# Patient Record
Sex: Female | Born: 1950 | Race: White | Hispanic: No | Smoking: Current some day smoker
Health system: Southern US, Community
[De-identification: ages and names within clinical notes are randomized; demographics above are authoritative.]

## PROBLEM LIST (undated history)

## (undated) DIAGNOSIS — I779 Disorder of arteries and arterioles, unspecified: Secondary | ICD-10-CM

## (undated) DIAGNOSIS — E119 Type 2 diabetes mellitus without complications: Secondary | ICD-10-CM

## (undated) DIAGNOSIS — N809 Endometriosis, unspecified: Secondary | ICD-10-CM

## (undated) DIAGNOSIS — M1990 Unspecified osteoarthritis, unspecified site: Secondary | ICD-10-CM

## (undated) DIAGNOSIS — F419 Anxiety disorder, unspecified: Secondary | ICD-10-CM

## (undated) DIAGNOSIS — G4730 Sleep apnea, unspecified: Secondary | ICD-10-CM

## (undated) DIAGNOSIS — Z9289 Personal history of other medical treatment: Secondary | ICD-10-CM

## (undated) DIAGNOSIS — I2510 Atherosclerotic heart disease of native coronary artery without angina pectoris: Secondary | ICD-10-CM

## (undated) DIAGNOSIS — E785 Hyperlipidemia, unspecified: Secondary | ICD-10-CM

## (undated) DIAGNOSIS — K219 Gastro-esophageal reflux disease without esophagitis: Secondary | ICD-10-CM

## (undated) DIAGNOSIS — D649 Anemia, unspecified: Secondary | ICD-10-CM

## (undated) DIAGNOSIS — I10 Essential (primary) hypertension: Secondary | ICD-10-CM

## (undated) DIAGNOSIS — C44310 Basal cell carcinoma of skin of unspecified parts of face: Secondary | ICD-10-CM

## (undated) DIAGNOSIS — I739 Peripheral vascular disease, unspecified: Secondary | ICD-10-CM

## (undated) DIAGNOSIS — Z951 Presence of aortocoronary bypass graft: Secondary | ICD-10-CM

## (undated) HISTORY — PX: FRACTURE SURGERY: SHX138

## (undated) HISTORY — DX: Disorder of arteries and arterioles, unspecified: I77.9

## (undated) HISTORY — PX: SHOULDER SURGERY: SHX246

## (undated) HISTORY — DX: Hyperlipidemia, unspecified: E78.5

## (undated) HISTORY — DX: Type 2 diabetes mellitus without complications: E11.9

## (undated) HISTORY — PX: ABDOMINAL HYSTERECTOMY: SHX81

## (undated) HISTORY — PX: APPENDECTOMY: SHX54

## (undated) HISTORY — DX: Peripheral vascular disease, unspecified: I73.9

## (undated) HISTORY — DX: Atherosclerotic heart disease of native coronary artery without angina pectoris: I25.10

## (undated) HISTORY — DX: Basal cell carcinoma of skin of unspecified parts of face: C44.310

## (undated) HISTORY — DX: Endometriosis, unspecified: N80.9

## (undated) HISTORY — PX: TUBAL LIGATION: SHX77

## (undated) HISTORY — DX: Anxiety disorder, unspecified: F41.9

## (undated) HISTORY — PX: CAROTID ENDARTERECTOMY: SUR193

## (undated) HISTORY — PX: CARDIAC CATHETERIZATION: SHX172

## (undated) HISTORY — DX: Essential (primary) hypertension: I10

## (undated) HISTORY — PX: BREAST SURGERY: SHX581

## (undated) HISTORY — DX: Unspecified osteoarthritis, unspecified site: M19.90

## (undated) HISTORY — DX: Personal history of other medical treatment: Z92.89

---

## 2005-09-12 ENCOUNTER — Encounter (INDEPENDENT_AMBULATORY_CARE_PROVIDER_SITE_OTHER): Payer: Self-pay | Admitting: *Deleted

## 2005-09-12 ENCOUNTER — Inpatient Hospital Stay (HOSPITAL_COMMUNITY): Admission: RE | Admit: 2005-09-12 | Discharge: 2005-09-13 | Payer: Self-pay | Admitting: Vascular Surgery

## 2009-02-17 DIAGNOSIS — E119 Type 2 diabetes mellitus without complications: Secondary | ICD-10-CM

## 2009-02-17 DIAGNOSIS — N390 Urinary tract infection, site not specified: Secondary | ICD-10-CM | POA: Insufficient documentation

## 2009-02-17 DIAGNOSIS — E118 Type 2 diabetes mellitus with unspecified complications: Secondary | ICD-10-CM | POA: Insufficient documentation

## 2009-02-17 DIAGNOSIS — I10 Essential (primary) hypertension: Secondary | ICD-10-CM | POA: Insufficient documentation

## 2009-02-17 DIAGNOSIS — E785 Hyperlipidemia, unspecified: Secondary | ICD-10-CM | POA: Insufficient documentation

## 2009-02-17 DIAGNOSIS — E781 Pure hyperglyceridemia: Secondary | ICD-10-CM | POA: Insufficient documentation

## 2009-02-17 DIAGNOSIS — I2510 Atherosclerotic heart disease of native coronary artery without angina pectoris: Secondary | ICD-10-CM | POA: Insufficient documentation

## 2009-02-17 HISTORY — DX: Type 2 diabetes mellitus without complications: E11.9

## 2011-01-11 NOTE — Op Note (Signed)
NAME:  Darlene Berry, Darlene Berry              ACCOUNT NO.:  [id number]   MEDICAL RECORD NO.:  [id number]          PATIENT TYPE:  INP   LOCATION:  2899                         FACILITY:  [facility]   PHYSICIAN:  Larina Earthly, M.D.    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   POSTOP DIAGNOSIS:  Severe asymptomatic left internal carotid artery  stenosis.   PROCEDURES:  Left carotid endarterectomy Dacron patch angioplasty.   SURGEON:  Larina Earthly, M.D.   ASSISTANT:  Pecola Leisure, [facility]   ANESTHESIA:  General endotracheal.   COMPLICATIONS:  None.   DISPOSITION:  To recovery room stable.   PROCEDURE IN DETAIL:  The patient was taken to the operating room and placed  in supine position where the area of the left neck was prepped and draped in  the usual sterile fashion.  Incision made over the carotid and incision  carried down through the platysma with electrocautery. Sternocleidomastoid  reflected posteriorly and the carotid sheath was opened. Common carotid  artery was encircled with umbilical tape and Rumel tourniquet. Dissection  was extended onto the bifurcation. The superior thyroid artery was encircled  2-0 silk Potts tie. The external carotid encircled with vessel loop. The  internal carotid encircled with umbilical tape and Rumel tourniquet. The  vagus and hypoglossal nerves were identified and preserved. The patient was  given 7000 units of intravenous heparin. After adequate circulation time the  internal, external and common carotid arteries were occluded. The common  carotid artery was opened with 11 blade and extended longitudinally with  Potts scissors. This was extended onto the internal carotid artery. The 10  shunt was passed up the internal carotid, allowed to back bleed and then  down the common  carotid where it was secured with Rumel tourniquets. The  endarterectomy was begun on the common carotid artery. The plaques divided  proximally with Potts scissors. The endarterectomy extended onto the  bifurcation. The external carotid was endarterectomized with eversion  technique and the internal carotid endarterectomized in open fashion.  Remaining atheromatous debris was removed from the endarterectomy plane. A  Finesse Hemashield Dacron patch was brought onto the field and sewn as a  patch angioplasty with a running 6-0 Prolene suture. Prior to completion of  the anastomosis the shunt was removed and the usual flushing maneuvers were  undertaken. Anastomosis was completed. The external followed by the common,  finally the internal carotid artery occlusion clamp was removed. Excellent  flow characteristics were noted with handheld Doppler in the internal,  external carotid arteries. The patient  was given 50 milligrams of protamine reverse heparin. The wounds were  irrigated with saline. Hemostasis was obtained with electrocautery.  The  wounds were closed 3-0 Vicryl in the subcutaneous and subcuticular tissue  and benzoin and Steri-Strips were applied.      Larina Earthly, M.D.  Electronically Signed     TFE/MEDQ  D:  09/12/2005  T:  09/12/2005  Job:  [id number]

## 2011-01-11 NOTE — H&P (Signed)
NAME:  Darlene Berry, Darlene Berry              ACCOUNT NO.:  [id number]   MEDICAL RECORD NO.:  [id number]          PATIENT TYPE:  INP   LOCATION:  NA                           FACILITY:  [facility]   PHYSICIAN:  Larina Earthly, M.D.    DATE OF BIRTH:  1951-04-09   DATE OF ADMISSION:  DATE OF DISCHARGE:                                HISTORY & PHYSICAL   REASON FOR ADMISSION:  Left internal carotid artery stenosis.   HISTORY OF PRESENT ILLNESS:  The patient is a 60 year old white female who  was found on recent physical exam and to have a left carotid bruit.  She  underwent an outpatient duplex scan which showed severe left internal  carotid artery stenosis with no significant stenosis on the right.  She was  referred to Dr. Tawanna Cooler Early for further evaluation and had a repeat  ultrasound in the [facility] office.  This confirmed an 80-99% stenosis of left  internal carotid artery with a 1-39% stenosis on the right.  The patient has  been completely asymptomatic and specifically denies any TIA symptoms,  syncope, visual changes, amaurosis fugax, dysphagia, dysarthria, weakness,  or other neuro symptoms. It was Dr. Bosie Helper opinion that she should undergo  a left carotid endarterectomy at this time in order to decrease her risk of  stroke.   PAST MEDICAL HISTORY:  1.  Hypertension.  2.  Mild coronary artery disease.  3.  Type 2 insulin-dependent diabetes mellitus.  4.  Hyperlipidemia.  5.  Peripheral neuropathy.   PAST SURGICAL HISTORY:  1.  Tubal ligation in 1980.  2.  Hysterectomy in 1992.   ALLERGIES:  No known drug allergies.   MEDICATIONS:  1.  Glipizide 10 mg b.i.d.  2.  Metformin 500 mg t.i.d.  3.  Enalapril 20 mg b.i.d.  4.  Atenolol 50 mg b.i.d.  5.  Insulin 70/30 sliding scale as directed.  6.  Lantus insulin 10-25 units every day via sliding scale.  7.  Regular insulin sliding scale p.r.n.  8.  Zocor 40 mg q.h.s.  9.  Bupropion 150 mg b.i.d.  10. Alprazolam 0.5 mg every day.  11.  Loratadine 10 mg every day.  12. Prilosec OTC 20 mg every day.  13. Ibuprofen 800 mg t.i.d.  14. Aspirin 81 mg every day.  15. She takes multiple supplements including a multivitamin, calcium 600 mg      every day, vitamin E 400 mg every day, Omega-3 fish oil 1000 mg every      day, potassium 99 mg every day, cinnamon 500 mg every day, alpha lipoic      acid every day, milk thistle 100 mg every day, and vitamin C every day.   SOCIAL HISTORY:  She resides in [facility] and is employed as a Barrister's clerk at [employer].  She previously smoked a pack of  cigarettes per day x12 years and quit in 1999.  She does not consume  alcohol.   FAMILY HISTORY:  Her mother died of lung cancer and had a history of  hardening arteries.  Her father died of  coronary artery disease and a  myocardial infarction.  She has one brother who is diabetic and  hypertensive.  He recently had a carotid endarterectomy.   REVIEW OF SYSTEMS:  See history of present illness for pertinent positives  and negatives.  Also, she has a history of mild coronary artery disease and  underwent a cardiac catheterization back in the spring of 2006 with no  change since her previous cath.  She is asymptomatic from this.  She is also  diabetic and her sugars are very labile and she becomes easily hypoglycemic  becoming faint, weak, short of breath, and tired.  She denies fevers,  chills, weight loss, recent infections.  Neuro symptoms as above.  Chest  pain, palpitations, shortness of breath, dyspnea on exertion, PND,  orthopnea, cough, hemoptysis, wheezing, abdominal pain, nausea, vomiting,  diarrhea, constipation, reflux symptoms, hematochezia, melena, hematemesis,  hematuria, nocturia or dysuria, lower extremity edema, claudication, rest  pain, non-healing ulcers, depression, anxiety, intolerance to heat or cold.   PHYSICAL EXAMINATION:  VITAL SIGNS:  Blood pressure is 132/66, heart rate 70  and regular,  respirations 16 and unlabored.  GENERAL:  This is a well-developed, well-nourished white female in no acute  distress.  HEENT:  Normocephalic, atraumatic. Pupils equal, round, and react to light  accommodation.  Extraocular movements intact.  TMs and canals are clear.  Nares patent bilaterally.  Oropharynx is clear with moist mucous membranes.  Neck:  Supple without lymphadenopathy or thyromegaly.  She has a harsh left  carotid bruit.  LUNGS:  Clear to auscultation.  HEART:  Regular rate and rhythm without murmurs, rubs, or gallops.  ABDOMEN:  Soft, nontender, nondistended with active bowel sounds in all  quadrants.  No masses or hepatosplenomegaly.  EXTREMITIES:  No clubbing, cyanosis, or edema.  She has 2+ femoral, dorsalis  pedis, and posterior tibial pulses bilaterally.  NEURO:  Cranial nerves II-XII grossly intact.  She is alert and oriented x3.  Gait within normal limits.  Muscle strength is 5+ and symmetrical in the  upper and lower extremities.   ASSESSMENT/PLAN:  This is a 60 year old female with asymptomatic left  internal carotid artery stenosis.  She will be admitted to [facility], on September 12, 2005, and undergo a left carotid endarterectomy by Dr. Gretta Began.      Coral Ceo, P.A.      Larina Earthly, M.D.  Electronically Signed    GC/MEDQ  D:  09/11/2005  T:  09/11/2005  Job:  [id number]   cc:   Prudence Davidson, Dr.  Brooke Dare

## 2011-01-11 NOTE — Discharge Summary (Signed)
NAME:  Darlene Berry, Darlene Berry              ACCOUNT NO.:  [id number]   MEDICAL RECORD NO.:  [id number]          PATIENT TYPE:  INP   LOCATION:  3315                         FACILITY:  [facility]   PHYSICIAN:  Larina Earthly, M.D.    DATE OF BIRTH:  10-06-1950   DATE OF ADMISSION:  09/12/2005  DATE OF DISCHARGE:  09/13/2005                                 DISCHARGE SUMMARY   ADMISSION DIAGNOSIS:  Severe asymptomatic left internal carotid artery  stenosis.   DISCHARGE/SECONDARY DIAGNOSES:  1.  Severe asymptomatic left internal carotid artery stenosis, status post      left carotid endarterectomy.  2.  Hypertension.  3.  Mild coronary artery disease.  4.  Type 2 insulin-dependent diabetes mellitus.  5.  Hyperlipidemia.  6.  Peripheral neuropathy.  7.  Tubal ligation in 1980.  8.  Hysterectomy in 1992.   ALLERGIES:  NO KNOWN DRUG ALLERGIES.   PROCEDURES:  September 12, 2005 -- left carotid endarterectomy with Dacron  patch angioplasty by Dr. Gretta Began.   BRIEF HISTORY:  Darlene Berry is a 60 year old Caucasian female who was found  on recent physical exam to have a left carotid bruit.  She underwent an  outpatient duplex scan which showed severe left internal carotid artery  stenosis and no significant stenosis on the right.  She was referred to Dr.  Tawanna Cooler Early for further evaluation and had a repeat ultrasound at the CVTS  office which confirmed 80-99% stenosis on the left and a 1-39% stenosis on  the right.  The patient has been completely asymptomatic and denied TIA  symptoms, syncope, visual changes, amaurosis fugax, dysphagia, dysarthria,  weakness or other neurological symptoms.  Dr. Arbie Cookey recommended that she  undergo an elective left carotid endarterectomy to reduce her risk for  future stroke.   HOSPITAL COURSE:  On September 12, 2005 Darlene Berry was electively admitted to  [facility] and did undergo left carotid endarterectomy.  She was  extubated neurologically intact and  after a short stay in the recovery unit,  was transferred to the Step Down Unit at 3300 with routine carotid  endarterectomy postoperative orders.  By postoperative day one she remained  hemodynamically stable and neurologically intact.  Exam showed her heart had  a regular rate and rhythm, lungs were clear, abdominal exam was benign, and  her incision was clean, dry and intact without evidence of hematoma.  Her  tongue was midline and she reported no significant difficulty swallowing.  Also that morning she reported that she was able to ambulate without  difficulty and void following removal of her Foley catheter.  She had some  mild nausea overnight which had now resolved and she was eventually able to  tolerate a regular diet.  Blood sugar in a.m. was 150.  Other labs showed a  white blood cell count of 6.5, hemoglobin 11.0, hematocrit 32.0, platelet  count 166, sodium 138, potassium 4.3, BUN 8, creatinine 0.9.  Pain was  controlled on oral medication.  Subsequently she was felt appropriate for  discharge home on postoperative day one, September 13, 2005.  DISCHARGE MEDICATIONS:  1.  Glipizide 10 mg p.o. b.i.d.  2.  Metformin 500 mg t.i.d.  3.  Enalapril 20 mg b.i.d.  4.  Atenolol 50 mg b.i.d.  5.  Insulin 70/30 sliding-scale as directed.  6.  Lantus insulin 10-25 units daily via sliding-scale.  7.  Regular insulin sliding-scale as needed.  8.  Zocor 40 mg q.h.s.  9.  Bupropion 150 mg p.o. b.i.d.  10. Alprazolam 0.5 mg every day.  11. Loratadine 10 mg every day.  12. Prilosec OTC 20 mg every day.  13. Ibuprofen 800 mg p.o. t.i.d.  14. Aspirin 81 mg p.o. daily.  15. She may resume her multiple supplements including multivitamin, calcium      600 mg, vitamin E 400 IUOmega3 fish oil 1000 mg capsules, potassium 99      mg, cinnamon 500 mg, alpha lipoic acid, milk thistle 100 mg and daily      vitamin C.  16. Tylox 1-2 tablets p.o. q.4h. p.r.n. pain.   DISCHARGE INSTRUCTIONS:  She  is to resume a diabetic appropriate diet.  She  is to avoid driving or heavy lifting for the next weeks.  She is to increase  her activities slowly and may begin showering on September 14, 2005.  She is  to notify the CVTS office if she develops fever greater than 101 or redness  or drainage from her incision site, or any changes in her neurological  status.  She is to follow-up with Dr. Tawanna Cooler Early at the CVTS office on  September 30, 2005 at 12:10 p.m. and should call sooner if needed.      Jerold Coombe, P.A.      Larina Earthly, M.D.  Electronically Signed    AWZ/MEDQ  D:  09/13/2005  T:  09/13/2005  Job:  [id number]   cc:   Prudence Davidson, MD

## 2012-04-06 DIAGNOSIS — M25569 Pain in unspecified knee: Secondary | ICD-10-CM | POA: Insufficient documentation

## 2012-04-06 DIAGNOSIS — E1140 Type 2 diabetes mellitus with diabetic neuropathy, unspecified: Secondary | ICD-10-CM

## 2012-04-06 HISTORY — DX: Type 2 diabetes mellitus with diabetic neuropathy, unspecified: E11.40

## 2012-08-18 DIAGNOSIS — N993 Prolapse of vaginal vault after hysterectomy: Secondary | ICD-10-CM | POA: Insufficient documentation

## 2012-08-18 HISTORY — DX: Prolapse of vaginal vault after hysterectomy: N99.3

## 2012-12-29 DIAGNOSIS — Z85828 Personal history of other malignant neoplasm of skin: Secondary | ICD-10-CM

## 2012-12-29 HISTORY — DX: Personal history of other malignant neoplasm of skin: Z85.828

## 2013-02-08 DIAGNOSIS — I209 Angina pectoris, unspecified: Secondary | ICD-10-CM | POA: Insufficient documentation

## 2013-02-24 ENCOUNTER — Other Ambulatory Visit: Payer: Self-pay | Admitting: *Deleted

## 2013-02-24 ENCOUNTER — Institutional Professional Consult (permissible substitution) (INDEPENDENT_AMBULATORY_CARE_PROVIDER_SITE_OTHER): Payer: Self-pay | Admitting: Thoracic Surgery (Cardiothoracic Vascular Surgery)

## 2013-02-24 VITALS — BP 123/77 | HR 82 | Resp 20 | Ht 62.0 in | Wt 157.0 lb

## 2013-02-24 DIAGNOSIS — I2510 Atherosclerotic heart disease of native coronary artery without angina pectoris: Secondary | ICD-10-CM | POA: Insufficient documentation

## 2013-02-24 NOTE — Progress Notes (Signed)
[address]       [name] [address]             [phone]     CARDIOTHORACIC SURGERY CONSULTATION REPORT  Referring Provider is Dara Hoyer, MD PCP is Verneita Griffes, MD  Chief Complaint  Patient presents with  . 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  The patient has undergone cardiac catheterization twice previously in the distant past for occasional symptoms of chest pain. Over the past several months the patient has developed increasing episodes of substernal chest tightness which typically occurs with physical activity and relieved by rest. 3 weeks ago she was working in her garden and doing some fairly strenuous digging when she developed a particular severe episode of substernal chest tightness which radiated to the jaw and was associated with severe shortness of breath. She stopped and rested and laid down, and the symptoms gradually went away. She was seen by her primary care physician and referred to [facility] in [place] where she underwent diagnostic cardiac catheterization. She was found to have three-vessel coronary artery disease with normal left ventricular systolic function. It was recommended that she undergo surgical revascularization and she was seen in consultation by one of the cardiothoracic surgeons. However, the patient insisted on referral to [facility] for definitive management. Because the patient's symptoms remained stable and her coronary anatomy was not felt to be critical, she was discharged from the hospital and referred for elective cardiothoracic surgical  consultation.  The patient states that she has been having mild symptoms of tightness across her chest and shortness of breath with exertion off and on for several months. She had the one particular a severe episode of chest discomfort as described above when she was performing strenuous activity several weeks ago. Since her hospital discharge recently she had 1 brief episode of chest discomfort which resolved promptly with administration of sublingual nitroglycerin. She has stable symptoms of exertional shortness of breath. She denies resting shortness of breath, PND, orthopnea, or lower extremity edema. She has not had palpitations nor syncope.  Past Medical History  Diagnosis Date  . Endometriosis   . Diabetes mellitus   . Carotid artery stenosis   . CAD (coronary artery disease)   . Hyperlipemia   . Hypertension   . Hx of transfusion   . Anxiety   . Arthritis     Past Surgical History  Procedure Laterality Date  . Abdominal hysterectomy    . Tubal ligation    . Cardiac catheterization      x 3  . Carotid endarterectomy Left   . Shoulder surgery Left     Family History  Problem Relation Age of Onset  . Cancer Mother   . Diabetes Father   . Hypertension Father   . Hyperlipidemia Father   . Heart disease Father   . Leukemia Sister   . Diabetes Brother   . Hyperlipidemia Brother   . Hypertension Brother   . Heart disease Brother     History   Social History  . Marital Status: Legally Separated    Spouse Name: N/A    Number of  Children: N/A  . Years of Education: N/A   Occupational History  . Not on file.   Social History Main Topics  . Smoking status: Current Every Day Smoker -- 0.50 packs/day    Types: Cigarettes  . Smokeless tobacco: Never Used  . Alcohol Use: No  . Drug Use: No  . Sexually Active: Not on file   Other Topics Concern  . Not on file   Social History Narrative  . No narrative on file    Current Outpatient Prescriptions  Medication  Sig Dispense Refill  . ALPRAZolam (XANAX) 0.5 MG tablet Take 0.5 mg by mouth at bedtime as needed for sleep.      Marland Kitchen aspirin 325 MG tablet Take 325 mg by mouth daily.      Marland Kitchen atenolol (TENORMIN) 25 MG tablet Take 25 mg by mouth 2 (two) times daily.       Marland Kitchen buPROPion (WELLBUTRIN SR) 150 MG 12 hr tablet Take 150 mg by mouth 2 (two) times daily.      . fenofibrate 160 MG tablet Take 160 mg by mouth daily.      Marland Kitchen glipiZIDE (GLUCOTROL) 10 MG tablet Take 10 mg by mouth 2 (two) times daily before a meal.      . insulin glargine (LANTUS) 100 UNIT/ML injection Inject 70 Units into the skin at bedtime.      . metformin (FORTAMET) 1000 MG (OSM) 24 hr tablet Take 1,000 mg by mouth 2 (two) times daily.      . nitrofurantoin (MACRODANTIN) 50 MG capsule Take 50 mg by mouth at bedtime.      . nitroGLYCERIN (NITROSTAT) 0.4 MG SL tablet Place 0.4 mg under the tongue every 5 (five) minutes as needed for chest pain.      . pravastatin (PRAVACHOL) 40 MG tablet Take 40 mg by mouth daily.      Marland Kitchen PRENATAL VITAMINS PO Take by mouth daily.      Marland Kitchen zolpidem (AMBIEN) 5 MG tablet Take 5 mg by mouth at bedtime as needed for sleep.       No current facility-administered medications for this visit.    No Known Allergies    Review of Systems:   General:  normal appetite, decreased energy, + weight gain, no weight loss, no fever  Cardiac:  + chest pain with exertion, no chest pain at rest, + SOB with moderate exertion, no resting SOB, no PND, no orthopnea, no palpitations, no arrhythmia, no atrial fibrillation, no LE edema, no dizzy spells, no syncope  Respiratory:  + shortness of breath, no home oxygen, no productive cough, + dry cough, no bronchitis, no wheezing, no hemoptysis, no asthma, no pain with inspiration or cough, + sleep apnea, + CPAP at night  GI:   + difficulty swallowing, + reflux, + frequent heartburn, no hiatal hernia, no abdominal pain, no constipation, no diarrhea, no hematochezia, no hematemesis, no  melena  GU:   no dysuria,  no frequency, recurrent urinary tract infections in past but none on suppression therapy, no hematuria, no kidney stones, no kidney disease  Vascular:  + mild pain in both calf muscles with ambulation, possibly suggestive of claudication, + pain in feet, no leg cramps, no varicose veins, no DVT, no non-healing foot ulcer  Neuro:   no stroke, no TIA's, no seizures, no headaches, no temporary blindness one eye,  no slurred speech, + peripheral neuropathy, + chronic pain in left shoulder, no instability of gait, no memory/cognitive dysfunction  Musculoskeletal: + arthritis  especially left shoulder, no joint swelling, + myalgias, no difficulty walking, normal mobility   Skin:   no rash, no itching, no skin infections, no pressure sores or ulcerations  Psych:   + anxiety, no depression, + nervousness, + unusual recent stress  Eyes:   no blurry vision, no floaters, no recent vision changes, + wears glasses or contacts  ENT:   no hearing loss, no loose or painful teeth, no dentures, last saw dentist June 2014  Hematologic:  no easy bruising, no abnormal bleeding, no clotting disorder, no frequent epistaxis  Endocrine:  + diabetes, checks CBG's at home, last Hgb a1c reportedly 6.7     Physical Exam:   BP 123/77  Pulse 82  Resp 20  Ht 5\' 2"  (1.575 m)  Wt 157 lb (71.215 kg)  BMI 28.71 kg/m2  SpO2 97%  General:  Mildly obese,  well-appearing  HEENT:  Unremarkable   Neck:   no JVD, no bruits, no adenopathy   Chest:   clear to auscultation, symmetrical breath sounds, no wheezes, no rhonchi   CV:   RRR, no murmur   Abdomen:  soft, non-tender, no masses   Extremities:  warm, well-perfused, pulses diminished, no LE edema  Rectal/GU  Deferred  Neuro:   Grossly non-focal and symmetrical throughout  Skin:   Clean and dry, no rashes, no breakdown   Diagnostic Tests:  CARDIAC CATHETERIZATION  Both report and images from diagnostic cardiac catheterization performed  02/18/2013 at [facility] are reviewed. The patient has three-vessel coronary artery disease with long segment 60-70% stenosis of the proximal left anterior descending coronary artery and 70% stenosis of the mid right coronary artery. The left circumflex coronary artery is a small vessel but has an 80% lesion. There is right dominant coronary circulation with 80% stenosis in the mid right coronary artery.   TRANSTHORACIC ECHOCARDIOGRAM  Both the report and images from tansthoracic echocardiogram performed 02/19/2013 [facility] is reviewed.  There is normal left ventricular size and systolic function. Ejection fraction is estimated 65-70%. There is very mild aortic valve sclerosis without aortic stenosis. No other significant abnormalities are noted.   Impression:  The patient has severe three-vessel coronary artery disease with preserved left ventricular systolic function and progressive symptoms of angina pectoris, functional class II.  The patient's right coronary artery stenosis could likely easily be treated with percutaneous coronary intervention and stenting. However, the long segment proximal stenosis of the left anterior descending coronary artery would not be favorable for stenting, and given the patient's underlying history of type 2 diabetes mellitus I feel that surgical revascularization would clearly provide the best long-term result. Risks associated with surgery should be quite low.    Plan:  I discussed the indications, risks, and potential benefits of coronary artery bypass grafting with the patient in the office today. Alternative treatment strategies have been discussed including long-term medical therapy versus percutaneous coronary intervention and stenting versus surgery. She understands and accepts all potential associated risks of surgery including but not limited to risk of death, stroke, myocardial infarction, congestive heart failure, respiratory  failure, renal failure, pneumonia, bleeding requiring blood transfusion, arrhythmia, infection, or late recurrence of coronary artery disease. We have discussed how important will be for her to find a way to quit smoking permanently. We've also discussed how important it will be for her to continue to be aggressive about the treatment of diabetes. All of her questions been addressed. We plan to proceed with surgery on Wednesday, July  16.    Clarence H. Cornelius Moras, MD 02/24/2013 5:27 PM

## 2013-02-24 NOTE — Patient Instructions (Addendum)
Continue all current medications without change through the day before surgery  On the morning of surgery take only your atenolol (tenormin) with a sip of water  Call EMS or go directly to the nearest emergency room for chest pain unrelieved by 3 sublingual nitroglycerin

## 2013-02-25 ENCOUNTER — Other Ambulatory Visit: Payer: Self-pay | Admitting: *Deleted

## 2013-02-25 DIAGNOSIS — I2510 Atherosclerotic heart disease of native coronary artery without angina pectoris: Secondary | ICD-10-CM

## 2013-03-02 ENCOUNTER — Encounter (HOSPITAL_COMMUNITY): Payer: Self-pay | Admitting: Pharmacy Technician

## 2013-03-08 ENCOUNTER — Ambulatory Visit (HOSPITAL_COMMUNITY)
Admission: RE | Admit: 2013-03-08 | Discharge: 2013-03-08 | Disposition: A | Discharge status: Home / Self Care | Payer: Medicare Other | Source: Ambulatory Visit | Attending: Thoracic Surgery (Cardiothoracic Vascular Surgery) | Admitting: Thoracic Surgery (Cardiothoracic Vascular Surgery)

## 2013-03-08 ENCOUNTER — Encounter (HOSPITAL_COMMUNITY): Payer: Self-pay

## 2013-03-08 ENCOUNTER — Inpatient Hospital Stay (HOSPITAL_COMMUNITY)
Admission: RE | Admit: 2013-03-08 | Discharge: 2013-03-08 | Disposition: A | Discharge status: Home / Self Care | Payer: Medicare Other | Source: Ambulatory Visit | Attending: Thoracic Surgery (Cardiothoracic Vascular Surgery) | Admitting: Thoracic Surgery (Cardiothoracic Vascular Surgery)

## 2013-03-08 ENCOUNTER — Encounter (HOSPITAL_COMMUNITY)
Admission: RE | Admit: 2013-03-08 | Discharge: 2013-03-08 | Disposition: A | Discharge status: Home / Self Care | Payer: Medicare Other | Source: Ambulatory Visit | Attending: Thoracic Surgery (Cardiothoracic Vascular Surgery) | Admitting: Thoracic Surgery (Cardiothoracic Vascular Surgery)

## 2013-03-08 VITALS — BP 173/82 | HR 69 | Temp 97.8°F | Resp 20 | Ht 62.0 in | Wt 157.1 lb

## 2013-03-08 DIAGNOSIS — E785 Hyperlipidemia, unspecified: Secondary | ICD-10-CM | POA: Insufficient documentation

## 2013-03-08 DIAGNOSIS — I2510 Atherosclerotic heart disease of native coronary artery without angina pectoris: Secondary | ICD-10-CM

## 2013-03-08 DIAGNOSIS — Z01811 Encounter for preprocedural respiratory examination: Secondary | ICD-10-CM | POA: Insufficient documentation

## 2013-03-08 DIAGNOSIS — Z01818 Encounter for other preprocedural examination: Secondary | ICD-10-CM | POA: Insufficient documentation

## 2013-03-08 DIAGNOSIS — I10 Essential (primary) hypertension: Secondary | ICD-10-CM | POA: Insufficient documentation

## 2013-03-08 DIAGNOSIS — Z01812 Encounter for preprocedural laboratory examination: Secondary | ICD-10-CM | POA: Insufficient documentation

## 2013-03-08 DIAGNOSIS — Z01810 Encounter for preprocedural cardiovascular examination: Secondary | ICD-10-CM | POA: Insufficient documentation

## 2013-03-08 DIAGNOSIS — R9431 Abnormal electrocardiogram [ECG] [EKG]: Secondary | ICD-10-CM | POA: Insufficient documentation

## 2013-03-08 DIAGNOSIS — E119 Type 2 diabetes mellitus without complications: Secondary | ICD-10-CM | POA: Insufficient documentation

## 2013-03-08 DIAGNOSIS — Z0183 Encounter for blood typing: Secondary | ICD-10-CM | POA: Insufficient documentation

## 2013-03-08 HISTORY — DX: Sleep apnea, unspecified: G47.30

## 2013-03-08 HISTORY — DX: Gastro-esophageal reflux disease without esophagitis: K21.9

## 2013-03-08 HISTORY — DX: Anemia, unspecified: D64.9

## 2013-03-08 LAB — COMPREHENSIVE METABOLIC PANEL
ALT: 49 U/L — ABNORMAL HIGH (ref 0–35)
AST: 43 U/L — ABNORMAL HIGH (ref 0–37)
Albumin: 4.2 g/dL (ref 3.5–5.2)
Alkaline Phosphatase: 54 U/L (ref 39–117)
BUN: 16 mg/dL (ref 6–23)
CO2: 24 mEq/L (ref 19–32)
Calcium: 10.2 mg/dL (ref 8.4–10.5)
Chloride: 96 mEq/L (ref 96–112)
Creatinine, Ser: 0.87 mg/dL (ref 0.50–1.10)
GFR calc Af Amer: 81 mL/min — ABNORMAL LOW (ref >90)
GFR calc non Af Amer: 70 mL/min — ABNORMAL LOW (ref >90)
Glucose, Bld: 122 mg/dL — ABNORMAL HIGH (ref 70–99)
Potassium: 4.1 mEq/L (ref 3.5–5.1)
Sodium: 135 mEq/L (ref 135–145)
Total Bilirubin: 0.3 mg/dL (ref 0.3–1.2)
Total Protein: 7.5 g/dL (ref 6.0–8.3)

## 2013-03-08 LAB — BLOOD GAS, ARTERIAL
Acid-Base Excess: 2.3 mmol/L — ABNORMAL HIGH (ref 0.0–2.0)
Bicarbonate: 26.2 mEq/L — ABNORMAL HIGH (ref 20.0–24.0)
Drawn by: 206361
FIO2: 0.21 %
O2 Saturation: 97.5 %
Patient temperature: 98.6
TCO2: 27.4 mmol/L (ref 0–100)
pCO2 arterial: 39.7 mmHg (ref 35.0–45.0)
pH, Arterial: 7.436 (ref 7.350–7.450)
pO2, Arterial: 88.1 mmHg (ref 80.0–100.0)

## 2013-03-08 LAB — TYPE AND SCREEN
ABO/RH(D): B POS
Antibody Screen: NEGATIVE

## 2013-03-08 LAB — CBC
HCT: 39.6 % (ref 36.0–46.0)
Hemoglobin: 14.2 g/dL (ref 12.0–15.0)
MCH: 29.7 pg (ref 26.0–34.0)
MCHC: 35.9 g/dL (ref 30.0–36.0)
MCV: 82.8 fL (ref 78.0–100.0)
Platelets: 215 10*3/uL (ref 150–400)
RBC: 4.78 MIL/uL (ref 3.87–5.11)
RDW: 12 % (ref 11.5–15.5)
WBC: 9.2 10*3/uL (ref 4.0–10.5)

## 2013-03-08 LAB — URINALYSIS, ROUTINE W REFLEX MICROSCOPIC
Bilirubin Urine: NEGATIVE
Glucose, UA: NEGATIVE mg/dL
Hgb urine dipstick: NEGATIVE
Ketones, ur: NEGATIVE mg/dL
Nitrite: NEGATIVE
Protein, ur: NEGATIVE mg/dL
Specific Gravity, Urine: 1.01 (ref 1.005–1.030)
Urobilinogen, UA: 1 mg/dL (ref 0.0–1.0)
pH: 8 (ref 5.0–8.0)

## 2013-03-08 LAB — PULMONARY FUNCTION TEST

## 2013-03-08 LAB — SURGICAL PCR SCREEN
MRSA, PCR: NEGATIVE
Staphylococcus aureus: NEGATIVE

## 2013-03-08 LAB — URINE MICROSCOPIC-ADD ON

## 2013-03-08 LAB — PROTIME-INR
INR: 0.99 (ref 0.00–1.49)
Prothrombin Time: 12.9 seconds (ref 11.6–15.2)

## 2013-03-08 LAB — APTT: aPTT: 24 seconds (ref 24–37)

## 2013-03-08 LAB — HEMOGLOBIN A1C
Hgb A1c MFr Bld: 6.6 % — ABNORMAL HIGH (ref <5.7)
Mean Plasma Glucose: 143 mg/dL — ABNORMAL HIGH (ref <117)

## 2013-03-08 NOTE — H&P (Signed)
[address]       [name] [address]             [phone]          CARDIOTHORACIC SURGERY HISTORY AND PHYSICAL EXAM  Referring Provider is Dara Hoyer, MD PCP is Verneita Griffes, MD    Chief Complaint   Patient presents with   .  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  The patient has undergone cardiac catheterization twice previously in the distant past for occasional symptoms of chest pain. Over the past several months the patient has developed increasing episodes of substernal chest tightness which typically occurs with physical activity and relieved by rest. 3 weeks ago she was working in her garden and doing some fairly strenuous digging when she developed a particular severe episode of substernal chest tightness which radiated to the jaw and was associated with severe shortness of breath. She stopped and rested and laid down, and the symptoms gradually went away. She was seen by her primary care physician and referred to [facility] in [place] where she underwent diagnostic cardiac catheterization. She was found to have three-vessel coronary artery disease with normal left ventricular systolic function. It was recommended that she undergo surgical revascularization and she was seen in consultation by one of the cardiothoracic surgeons. However, the patient insisted on referral to [facility] for definitive management. Because the patient's symptoms remained stable and her coronary anatomy was not felt to be critical, she was discharged from the hospital and referred for elective cardiothoracic surgical  consultation.  The patient states that she has been having mild symptoms of tightness across her chest and shortness of breath with exertion off and on for several months. She had the one particular a severe episode of chest discomfort as described above when she was performing strenuous activity several weeks ago. Since her hospital discharge recently she had 1 brief episode of chest discomfort which resolved promptly with administration of sublingual nitroglycerin. She has stable symptoms of exertional shortness of breath. She denies resting shortness of breath, PND, orthopnea, or lower extremity edema. She has not had palpitations nor syncope.   Past Medical History  Diagnosis Date  . Endometriosis   . Diabetes mellitus   . Carotid artery stenosis   . CAD (coronary artery disease)   . Hyperlipemia   . Hypertension   . Hx of transfusion   . Anxiety   . Arthritis     Past Surgical History  Procedure Laterality Date  . Abdominal hysterectomy    . Tubal ligation    . Cardiac catheterization      x 3  . Carotid endarterectomy Left   . Shoulder surgery Left     Family History  Problem Relation Age of Onset  . Cancer Mother   . Diabetes Father   . Hypertension Father   . Hyperlipidemia Father   . Heart disease Father   . Leukemia Sister   . Diabetes Brother   . Hyperlipidemia Brother   . Hypertension Brother   . Heart disease Brother     Social History History  Substance  Use Topics  . Smoking status: Current Every Day Smoker -- 0.50 packs/day    Types: Cigarettes  . Smokeless tobacco: Never Used  . Alcohol Use: No    Prior to Admission medications   Medication Sig Start Date End Date Taking? Authorizing Provider  ALPRAZolam Prudy Feeler) 0.5 MG tablet Take 0.5 mg by mouth at bedtime as needed for sleep.   Yes Historical Provider, MD  aspirin 325 MG tablet Take 325 mg by mouth daily.   Yes Historical Provider, MD  atenolol (TENORMIN) 25 MG tablet Take 25 mg by mouth 2  (two) times daily.    Yes Historical Provider, MD  buPROPion (WELLBUTRIN SR) 150 MG 12 hr tablet Take 150 mg by mouth 2 (two) times daily.   Yes Historical Provider, MD  fenofibrate 160 MG tablet Take 160 mg by mouth daily.   Yes Historical Provider, MD  glipiZIDE (GLUCOTROL) 10 MG tablet Take 10 mg by mouth 2 (two) times daily before a meal.   Yes Historical Provider, MD  insulin aspart (NOVOLOG) 100 UNIT/ML injection Inject 0-8 Units into the skin daily as needed for high blood sugar.   Yes Historical Provider, MD  insulin glargine (LANTUS) 100 UNIT/ML injection Inject 70 Units into the skin at bedtime.   Yes Historical Provider, MD  magnesium oxide (MAG-OX) 400 MG tablet Take 400 mg by mouth daily.   Yes Historical Provider, MD  metformin (FORTAMET) 1000 MG (OSM) 24 hr tablet Take 1,000 mg by mouth 2 (two) times daily.   Yes Historical Provider, MD  nitrofurantoin (MACRODANTIN) 50 MG capsule Take 50 mg by mouth at bedtime.   Yes Historical Provider, MD  nitroGLYCERIN (NITROSTAT) 0.4 MG SL tablet Place 0.4 mg under the tongue every 5 (five) minutes as needed for chest pain.   Yes Historical Provider, MD  Potassium Gluconate 595 MG CAPS Take 595 mg by mouth daily.   Yes Historical Provider, MD  pravastatin (PRAVACHOL) 40 MG tablet Take 40 mg by mouth daily.   Yes Historical Provider, MD  PRENATAL VITAMINS PO Take by mouth daily.   Yes Historical Provider, MD  zolpidem (AMBIEN) 5 MG tablet Take 5 mg by mouth at bedtime as needed for sleep.   Yes Historical Provider, MD    No Known Allergies     Review of Systems:              General:                      normal appetite, decreased energy, + weight gain, no weight loss, no fever             Cardiac:                      + chest pain with exertion, no chest pain at rest, + SOB with moderate exertion, no resting SOB, no PND, no orthopnea, no palpitations, no arrhythmia, no atrial fibrillation, no LE edema, no dizzy spells, no syncope              Respiratory:                + shortness of breath, no home oxygen, no productive cough, + dry cough, no bronchitis, no wheezing, no hemoptysis, no asthma, no pain with inspiration or cough, + sleep apnea, + CPAP at night             GI:                                +  difficulty swallowing, + reflux, + frequent heartburn, no hiatal hernia, no abdominal pain, no constipation, no diarrhea, no hematochezia, no hematemesis, no melena             GU:                              no dysuria,  no frequency, recurrent urinary tract infections in past but none on suppression therapy, no hematuria, no kidney stones, no kidney disease             Vascular:                     + mild pain in both calf muscles with ambulation, possibly suggestive of claudication, + pain in feet, no leg cramps, no varicose veins, no DVT, no non-healing foot ulcer             Neuro:                         no stroke, no TIA's, no seizures, no headaches, no temporary blindness one eye,  no slurred speech, + peripheral neuropathy, + chronic pain in left shoulder, no instability of gait, no memory/cognitive dysfunction             Musculoskeletal:         + arthritis especially left shoulder, no joint swelling, + myalgias, no difficulty walking, normal mobility               Skin:                            no rash, no itching, no skin infections, no pressure sores or ulcerations             Psych:                         + anxiety, no depression, + nervousness, + unusual recent stress             Eyes:                           no blurry vision, no floaters, no recent vision changes, + wears glasses or contacts             ENT:                            no hearing loss, no loose or painful teeth, no dentures, last saw dentist June 2014             Hematologic:               no easy bruising, no abnormal bleeding, no clotting disorder, no frequent epistaxis             Endocrine:                   + diabetes, checks CBG's at home, last  Hgb a1c reportedly 6.7                           Physical Exam:              BP 123/77  Pulse 82  Resp 20  Ht 5'  2" (1.575 m)  Wt 157 lb (71.215 kg)  BMI 28.71 kg/m2  SpO2 97%             General:                      Mildly obese,  well-appearing             HEENT:                       Unremarkable               Neck:                           no JVD, no bruits, no adenopathy               Chest:                         clear to auscultation, symmetrical breath sounds, no wheezes, no rhonchi               CV:                              RRR, no murmur               Abdomen:                    soft, non-tender, no masses               Extremities:                 warm, well-perfused, pulses diminished, no LE edema             Rectal/GU                   Deferred             Neuro:                         Grossly non-focal and symmetrical throughout             Skin:                            Clean and dry, no rashes, no breakdown   Diagnostic Tests:  CARDIAC CATHETERIZATION  Both report and images from diagnostic cardiac catheterization performed 02/18/2013 at [facility] are reviewed. The patient has three-vessel coronary artery disease with long segment 60-70% stenosis of the proximal left anterior descending coronary artery and 70% stenosis of the mid right coronary artery. The left circumflex coronary artery is a small vessel but has an 80% lesion. There is right dominant coronary circulation with 80% stenosis in the mid right coronary artery.   TRANSTHORACIC ECHOCARDIOGRAM  Both the report and images from tansthoracic echocardiogram performed 02/19/2013 [facility] is reviewed.  There is normal left ventricular size and systolic function. Ejection fraction is estimated 65-70%. There is very mild aortic valve sclerosis without aortic stenosis. No other significant abnormalities are noted.   Impression:  The patient has severe three-vessel coronary  artery disease with preserved left ventricular systolic function and progressive symptoms of angina pectoris, functional class II.  The patient's right coronary artery stenosis could likely  easily be treated with percutaneous coronary intervention and stenting. However, the long segment proximal stenosis of the left anterior descending coronary artery would not be favorable for stenting, and given the patient's underlying history of type 2 diabetes mellitus I feel that surgical revascularization would clearly provide the best long-term result. Risks associated with surgery should be quite low.    Plan:  I discussed the indications, risks, and potential benefits of coronary artery bypass grafting with the patient in the office today. Alternative treatment strategies have been discussed including long-term medical therapy versus percutaneous coronary intervention and stenting versus surgery. She understands and accepts all potential associated risks of surgery including but not limited to risk of death, stroke, myocardial infarction, congestive heart failure, respiratory failure, renal failure, pneumonia, bleeding requiring blood transfusion, arrhythmia, infection, or late recurrence of coronary artery disease. We have discussed how important will be for her to find a way to quit smoking permanently. We've also discussed how important it will be for her to continue to be aggressive about the treatment of diabetes. All of her questions been addressed. We plan to proceed with surgery on Wednesday, July 16.    Salvatore Decent. Cornelius Moras, MD 02/24/2013 5:27 PM

## 2013-03-08 NOTE — Progress Notes (Addendum)
Pt. Had PFT's & dopplers today, prior to PAT appt.

## 2013-03-08 NOTE — Progress Notes (Signed)
Call to Ryann in TCT, confirmed that this pt. Has had her teaching appt. , book issued to pt. While in PAT appt.

## 2013-03-08 NOTE — Progress Notes (Signed)
*  Preliminary Results*   Pre-op Cardiac Surgery  Carotid Findings:  Completed 02/09/2013, revealed no hemodynamically significant stenosis >50%.  Upper Extremity Right Left  Brachial Pressures 197-Triphasic Triphasic- unable to compress.  Radial Waveforms Triphasic Triphasic  Ulnar Waveforms Triphasic Triphasic  Palmar Arch (Allen's Test) Within normal limits. Within normal limits.     Lower  Extremity Right Left  Dorsalis Pedis 217- Triphasic 188-Severely dampened monophasic  Anterior Tibial    Posterior Tibial 192-Triphasic 237-Triphasic  Ankle/Brachial Indices 1.12 1.22    03/08/2013 2:52 PM Anuj Summons, RVT, RDCS, RDMS

## 2013-03-08 NOTE — Progress Notes (Signed)
Requesting records from [facility] for cardiac studies, ekg, stress test, cath report.

## 2013-03-08 NOTE — Pre-Procedure Instructions (Signed)
Darlene Berry  03/08/2013   Your procedure is scheduled on:  03/10/2013  Report to [facility] at 6:30 AM.  Call this number if you have problems the morning of surgery: [phone]   Remember:   Do not eat food or drink liquids after midnight. TUESDAY   Take these medicines the morning of surgery with A SIP OF WATER: atenolol, wellbutrin, omeprazole   Do not wear jewelry, make-up or nail polish.  Do not wear lotions, powders, or perfumes. You may wear deodorant.  Do not shave 48 hours prior to surgery.   Do not bring valuables to the hospital.  [facility] is not responsible                   for any belongings or valuables.  Contacts, dentures or bridgework may not be worn into surgery.  Leave suitcase in the car. After surgery it may be brought to your room.  For patients admitted to the hospital, checkout time is 11:00 AM the day of  discharge.   Patients discharged the day of surgery will not be allowed to drive  home.  Name and phone number of your driver: with family  Special Instructions: Shower using CHG 2 nights before surgery and the night before surgery.  If you shower the day of surgery use CHG.  Use special wash - you have one bottle of CHG for all showers.  You should use approximately 1/3 of the bottle for each shower.   Please read over the following fact sheets that you were given: Pain Booklet, Coughing and Deep Breathing, Blood Transfusion Information, Open Heart Packet, MRSA Information and Surgical Site Infection Prevention

## 2013-03-09 MED ORDER — VANCOMYCIN HCL 1000 MG IV SOLR
INTRAVENOUS | Status: DC
  Filled 2013-03-09: qty 1000

## 2013-03-09 MED ORDER — SODIUM CHLORIDE 0.9 % IV SOLN
INTRAVENOUS | Status: AC
  Administered 2013-03-10: 69 mL/h via INTRAVENOUS
  Filled 2013-03-09: qty 40

## 2013-03-09 MED ORDER — DEXTROSE 5 % IV SOLN
750.0000 mg | INTRAVENOUS | Status: DC
  Filled 2013-03-09 (×3): qty 750

## 2013-03-09 MED ORDER — METOPROLOL TARTRATE 12.5 MG HALF TABLET: 12.5000 mg | ORAL_TABLET | Freq: Once | ORAL | Status: DC

## 2013-03-09 MED ORDER — DEXMEDETOMIDINE HCL IN NACL 400 MCG/100ML IV SOLN
0.1000 ug/kg/h | INTRAVENOUS | Status: AC
  Administered 2013-03-10: 0.2 ug/kg/h via INTRAVENOUS
  Filled 2013-03-09: qty 100

## 2013-03-09 MED ORDER — VANCOMYCIN HCL 10 G IV SOLR
1250.0000 mg | INTRAVENOUS | Status: AC
  Administered 2013-03-10: 1250 mg via INTRAVENOUS
  Filled 2013-03-09: qty 1250

## 2013-03-09 MED ORDER — HEPARIN SODIUM (PORCINE) 1000 UNIT/ML IJ SOLN
INTRAMUSCULAR | Status: DC
  Filled 2013-03-09: qty 30

## 2013-03-09 MED ORDER — PAPAVERINE HCL 30 MG/ML IJ SOLN
INTRAMUSCULAR | Status: DC
  Filled 2013-03-09: qty 2.5

## 2013-03-09 MED ORDER — EPINEPHRINE HCL 1 MG/ML IJ SOLN
0.5000 ug/min | INTRAVENOUS | Status: DC
  Filled 2013-03-09: qty 4

## 2013-03-09 MED ORDER — NITROGLYCERIN IN D5W 200-5 MCG/ML-% IV SOLN
2.0000 ug/min | INTRAVENOUS | Status: AC
  Administered 2013-03-10: 16.6 ug/min via INTRAVENOUS
  Filled 2013-03-09: qty 250

## 2013-03-09 MED ORDER — MAGNESIUM SULFATE 50 % IJ SOLN
40.0000 meq | INTRAMUSCULAR | Status: DC
  Filled 2013-03-09: qty 10

## 2013-03-09 MED ORDER — SODIUM CHLORIDE 0.9 % IV SOLN
INTRAVENOUS | Status: AC
  Administered 2013-03-10: 1 [IU]/h via INTRAVENOUS
  Filled 2013-03-09: qty 1

## 2013-03-09 MED ORDER — PHENYLEPHRINE HCL 10 MG/ML IJ SOLN
30.0000 ug/min | INTRAVENOUS | Status: AC
  Administered 2013-03-10: 10 ug/min via INTRAVENOUS
  Filled 2013-03-09: qty 2

## 2013-03-09 MED ORDER — DOPAMINE-DEXTROSE 3.2-5 MG/ML-% IV SOLN
2.0000 ug/kg/min | INTRAVENOUS | Status: DC
  Filled 2013-03-09: qty 250

## 2013-03-09 MED ORDER — DEXTROSE 5 % IV SOLN
1.5000 g | INTRAVENOUS | Status: AC
  Administered 2013-03-10: .75 g via INTRAVENOUS
  Administered 2013-03-10: 1.5 g via INTRAVENOUS
  Filled 2013-03-09 (×2): qty 1.5

## 2013-03-09 MED ORDER — PLASMA-LYTE 148 IV SOLN
INTRAVENOUS | Status: DC
  Filled 2013-03-09 (×2): qty 2.5

## 2013-03-09 MED ORDER — POTASSIUM CHLORIDE 2 MEQ/ML IV SOLN
80.0000 meq | INTRAVENOUS | Status: DC
  Filled 2013-03-09: qty 40

## 2013-03-09 MED ORDER — DEXTROSE 5 % IV SOLN
750.0000 mg | INTRAVENOUS | Status: DC
  Filled 2013-03-09: qty 750

## 2013-03-10 ENCOUNTER — Encounter (HOSPITAL_COMMUNITY): Payer: Self-pay | Admitting: Vascular Surgery

## 2013-03-10 ENCOUNTER — Encounter (HOSPITAL_COMMUNITY)
Admission: RE | Disposition: A | Discharge status: Home / Self Care | Payer: Self-pay | Source: Ambulatory Visit | Attending: Thoracic Surgery (Cardiothoracic Vascular Surgery)

## 2013-03-10 ENCOUNTER — Inpatient Hospital Stay (HOSPITAL_COMMUNITY)
Admission: RE | Admit: 2013-03-10 | Discharge: 2013-03-15 | DRG: 236 | Disposition: A | Discharge status: Home / Self Care | Payer: Medicare Other | Source: Ambulatory Visit | Attending: Thoracic Surgery (Cardiothoracic Vascular Surgery) | Admitting: Thoracic Surgery (Cardiothoracic Vascular Surgery)

## 2013-03-10 ENCOUNTER — Encounter (HOSPITAL_COMMUNITY): Payer: Self-pay | Admitting: *Deleted

## 2013-03-10 ENCOUNTER — Inpatient Hospital Stay (HOSPITAL_COMMUNITY): Payer: Medicare Other

## 2013-03-10 ENCOUNTER — Inpatient Hospital Stay (HOSPITAL_COMMUNITY): Payer: Medicare Other | Admitting: Certified Registered"

## 2013-03-10 DIAGNOSIS — F411 Generalized anxiety disorder: Secondary | ICD-10-CM | POA: Diagnosis present

## 2013-03-10 DIAGNOSIS — E8779 Other fluid overload: Secondary | ICD-10-CM | POA: Diagnosis not present

## 2013-03-10 DIAGNOSIS — R110 Nausea: Secondary | ICD-10-CM | POA: Diagnosis not present

## 2013-03-10 DIAGNOSIS — F17200 Nicotine dependence, unspecified, uncomplicated: Secondary | ICD-10-CM | POA: Diagnosis present

## 2013-03-10 DIAGNOSIS — J988 Other specified respiratory disorders: Secondary | ICD-10-CM | POA: Diagnosis not present

## 2013-03-10 DIAGNOSIS — Z806 Family history of leukemia: Secondary | ICD-10-CM

## 2013-03-10 DIAGNOSIS — Z79899 Other long term (current) drug therapy: Secondary | ICD-10-CM

## 2013-03-10 DIAGNOSIS — M129 Arthropathy, unspecified: Secondary | ICD-10-CM | POA: Diagnosis present

## 2013-03-10 DIAGNOSIS — D696 Thrombocytopenia, unspecified: Secondary | ICD-10-CM | POA: Diagnosis not present

## 2013-03-10 DIAGNOSIS — Y832 Surgical operation with anastomosis, bypass or graft as the cause of abnormal reaction of the patient, or of later complication, without mention of misadventure at the time of the procedure: Secondary | ICD-10-CM | POA: Diagnosis not present

## 2013-03-10 DIAGNOSIS — J9819 Other pulmonary collapse: Secondary | ICD-10-CM | POA: Diagnosis not present

## 2013-03-10 DIAGNOSIS — Z9851 Tubal ligation status: Secondary | ICD-10-CM

## 2013-03-10 DIAGNOSIS — Z794 Long term (current) use of insulin: Secondary | ICD-10-CM

## 2013-03-10 DIAGNOSIS — I209 Angina pectoris, unspecified: Secondary | ICD-10-CM | POA: Diagnosis present

## 2013-03-10 DIAGNOSIS — Z7982 Long term (current) use of aspirin: Secondary | ICD-10-CM

## 2013-03-10 DIAGNOSIS — E119 Type 2 diabetes mellitus without complications: Secondary | ICD-10-CM | POA: Diagnosis present

## 2013-03-10 DIAGNOSIS — E785 Hyperlipidemia, unspecified: Secondary | ICD-10-CM | POA: Diagnosis present

## 2013-03-10 DIAGNOSIS — D62 Acute posthemorrhagic anemia: Secondary | ICD-10-CM | POA: Diagnosis not present

## 2013-03-10 DIAGNOSIS — I10 Essential (primary) hypertension: Secondary | ICD-10-CM | POA: Diagnosis present

## 2013-03-10 DIAGNOSIS — Z951 Presence of aortocoronary bypass graft: Secondary | ICD-10-CM

## 2013-03-10 DIAGNOSIS — Y9210 Unspecified residential institution as the place of occurrence of the external cause: Secondary | ICD-10-CM | POA: Diagnosis not present

## 2013-03-10 DIAGNOSIS — E669 Obesity, unspecified: Secondary | ICD-10-CM | POA: Diagnosis present

## 2013-03-10 DIAGNOSIS — Z8249 Family history of ischemic heart disease and other diseases of the circulatory system: Secondary | ICD-10-CM

## 2013-03-10 DIAGNOSIS — I2510 Atherosclerotic heart disease of native coronary artery without angina pectoris: Secondary | ICD-10-CM

## 2013-03-10 DIAGNOSIS — Z833 Family history of diabetes mellitus: Secondary | ICD-10-CM

## 2013-03-10 DIAGNOSIS — Z6828 Body mass index (BMI) 28.0-28.9, adult: Secondary | ICD-10-CM

## 2013-03-10 HISTORY — DX: Presence of aortocoronary bypass graft: Z95.1

## 2013-03-10 HISTORY — PX: INTRAOPERATIVE TRANSESOPHAGEAL ECHOCARDIOGRAM: SHX5062

## 2013-03-10 HISTORY — PX: ENDOVEIN HARVEST OF GREATER SAPHENOUS VEIN: SHX5059

## 2013-03-10 HISTORY — PX: CORONARY ARTERY BYPASS GRAFT: SHX141

## 2013-03-10 LAB — POCT I-STAT 3, ART BLOOD GAS (G3+)
Acid-base deficit: 3 mmol/L — ABNORMAL HIGH (ref 0.0–2.0)
Acid-base deficit: 5 mmol/L — ABNORMAL HIGH (ref 0.0–2.0)
Acid-base deficit: 15 mmol/L — ABNORMAL HIGH (ref 0.0–2.0)
Acid-base deficit: 4 mmol/L — ABNORMAL HIGH (ref 0.0–2.0)
Acid-base deficit: 4 mmol/L — ABNORMAL HIGH (ref 0.0–2.0)
Acid-base deficit: 3 mmol/L — ABNORMAL HIGH (ref 0.0–2.0)
Acid-base deficit: 5 mmol/L — ABNORMAL HIGH (ref 0.0–2.0)
Acid-base deficit: 5 mmol/L — ABNORMAL HIGH (ref 0.0–2.0)
Acid-base deficit: 2 mmol/L (ref 0.0–2.0)
Acid-base deficit: 2 mmol/L (ref 0.0–2.0)
Acid-base deficit: 2 mmol/L (ref 0.0–2.0)
Bicarbonate: 23.2 mEq/L (ref 20.0–24.0)
Bicarbonate: 22 mEq/L (ref 20.0–24.0)
Bicarbonate: 10 mEq/L — ABNORMAL LOW (ref 20.0–24.0)
Bicarbonate: 21.4 mEq/L (ref 20.0–24.0)
Bicarbonate: 23.5 mEq/L (ref 20.0–24.0)
Bicarbonate: 23 mEq/L (ref 20.0–24.0)
Bicarbonate: 22.7 mEq/L (ref 20.0–24.0)
Bicarbonate: 23.2 mEq/L (ref 20.0–24.0)
Bicarbonate: 25.3 mEq/L — ABNORMAL HIGH (ref 20.0–24.0)
Bicarbonate: 25.9 mEq/L — ABNORMAL HIGH (ref 20.0–24.0)
Bicarbonate: 24.3 mEq/L — ABNORMAL HIGH (ref 20.0–24.0)
O2 Saturation: 99 %
O2 Saturation: 99 %
O2 Saturation: 99 %
O2 Saturation: 99 %
O2 Saturation: 98 %
O2 Saturation: 99 %
O2 Saturation: 97 %
O2 Saturation: 98 %
O2 Saturation: 98 %
O2 Saturation: 98 %
O2 Saturation: 98 %
Patient temperature: 36.7
Patient temperature: 36.7
Patient temperature: 36.6
Patient temperature: 35.9
Patient temperature: 35.8
Patient temperature: 35.9
Patient temperature: 35.9
Patient temperature: 36.1
Patient temperature: 36.4
TCO2: 24 mmol/L (ref 0–100)
TCO2: 23 mmol/L (ref 0–100)
TCO2: 11 mmol/L (ref 0–100)
TCO2: 23 mmol/L (ref 0–100)
TCO2: 25 mmol/L (ref 0–100)
TCO2: 24 mmol/L (ref 0–100)
TCO2: 24 mmol/L (ref 0–100)
TCO2: 25 mmol/L (ref 0–100)
TCO2: 27 mmol/L (ref 0–100)
TCO2: 28 mmol/L (ref 0–100)
TCO2: 26 mmol/L (ref 0–100)
pCO2 arterial: 44.1 mmHg (ref 35.0–45.0)
pCO2 arterial: 50.8 mmHg — ABNORMAL HIGH (ref 35.0–45.0)
pCO2 arterial: 20.2 mmHg — ABNORMAL LOW (ref 35.0–45.0)
pCO2 arterial: 39 mmHg (ref 35.0–45.0)
pCO2 arterial: 51.7 mmHg — ABNORMAL HIGH (ref 35.0–45.0)
pCO2 arterial: 44 mmHg (ref 35.0–45.0)
pCO2 arterial: 49.1 mmHg — ABNORMAL HIGH (ref 35.0–45.0)
pCO2 arterial: 51.3 mmHg — ABNORMAL HIGH (ref 35.0–45.0)
pCO2 arterial: 51.8 mmHg — ABNORMAL HIGH (ref 35.0–45.0)
pCO2 arterial: 55.5 mmHg — ABNORMAL HIGH (ref 35.0–45.0)
pCO2 arterial: 48.9 mmHg — ABNORMAL HIGH (ref 35.0–45.0)
pH, Arterial: 7.328 — ABNORMAL LOW (ref 7.350–7.450)
pH, Arterial: 7.244 — ABNORMAL LOW (ref 7.350–7.450)
pH, Arterial: 7.301 — ABNORMAL LOW (ref 7.350–7.450)
pH, Arterial: 7.345 — ABNORMAL LOW (ref 7.350–7.450)
pH, Arterial: 7.264 — ABNORMAL LOW (ref 7.350–7.450)
pH, Arterial: 7.32 — ABNORMAL LOW (ref 7.350–7.450)
pH, Arterial: 7.256 — ABNORMAL LOW (ref 7.350–7.450)
pH, Arterial: 7.266 — ABNORMAL LOW (ref 7.350–7.450)
pH, Arterial: 7.291 — ABNORMAL LOW (ref 7.350–7.450)
pH, Arterial: 7.272 — ABNORMAL LOW (ref 7.350–7.450)
pH, Arterial: 7.302 — ABNORMAL LOW (ref 7.350–7.450)
pO2, Arterial: 160 mmHg — ABNORMAL HIGH (ref 80.0–100.0)
pO2, Arterial: 164 mmHg — ABNORMAL HIGH (ref 80.0–100.0)
pO2, Arterial: 134 mmHg — ABNORMAL HIGH (ref 80.0–100.0)
pO2, Arterial: 152 mmHg — ABNORMAL HIGH (ref 80.0–100.0)
pO2, Arterial: 129 mmHg — ABNORMAL HIGH (ref 80.0–100.0)
pO2, Arterial: 136 mmHg — ABNORMAL HIGH (ref 80.0–100.0)
pO2, Arterial: 102 mmHg — ABNORMAL HIGH (ref 80.0–100.0)
pO2, Arterial: 111 mmHg — ABNORMAL HIGH (ref 80.0–100.0)
pO2, Arterial: 110 mmHg — ABNORMAL HIGH (ref 80.0–100.0)
pO2, Arterial: 129 mmHg — ABNORMAL HIGH (ref 80.0–100.0)
pO2, Arterial: 113 mmHg — ABNORMAL HIGH (ref 80.0–100.0)

## 2013-03-10 LAB — GLUCOSE, CAPILLARY
Glucose-Capillary: 247 mg/dL — ABNORMAL HIGH (ref 70–99)
Glucose-Capillary: 130 mg/dL — ABNORMAL HIGH (ref 70–99)
Glucose-Capillary: 149 mg/dL — ABNORMAL HIGH (ref 70–99)
Glucose-Capillary: 155 mg/dL — ABNORMAL HIGH (ref 70–99)
Glucose-Capillary: 186 mg/dL — ABNORMAL HIGH (ref 70–99)
Glucose-Capillary: 188 mg/dL — ABNORMAL HIGH (ref 70–99)
Glucose-Capillary: 160 mg/dL — ABNORMAL HIGH (ref 70–99)
Glucose-Capillary: 160 mg/dL — ABNORMAL HIGH (ref 70–99)
Glucose-Capillary: 156 mg/dL — ABNORMAL HIGH (ref 70–99)
Glucose-Capillary: 145 mg/dL — ABNORMAL HIGH (ref 70–99)
Glucose-Capillary: 181 mg/dL — ABNORMAL HIGH (ref 70–99)

## 2013-03-10 LAB — POCT I-STAT, CHEM 8
BUN: 19 mg/dL (ref 6–23)
Calcium, Ion: 1.16 mmol/L (ref 1.13–1.30)
Chloride: 102 mEq/L (ref 96–112)
Creatinine, Ser: 1 mg/dL (ref 0.50–1.10)
Glucose, Bld: 174 mg/dL — ABNORMAL HIGH (ref 70–99)
HCT: 32 % — ABNORMAL LOW (ref 36.0–46.0)
Hemoglobin: 10.9 g/dL — ABNORMAL LOW (ref 12.0–15.0)
Potassium: 4.3 mEq/L (ref 3.5–5.1)
Sodium: 138 mEq/L (ref 135–145)
TCO2: 24 mmol/L (ref 0–100)

## 2013-03-10 LAB — POCT I-STAT 4, (NA,K, GLUC, HGB,HCT)
Glucose, Bld: 240 mg/dL — ABNORMAL HIGH (ref 70–99)
Glucose, Bld: 198 mg/dL — ABNORMAL HIGH (ref 70–99)
Glucose, Bld: 159 mg/dL — ABNORMAL HIGH (ref 70–99)
Glucose, Bld: 161 mg/dL — ABNORMAL HIGH (ref 70–99)
Glucose, Bld: 175 mg/dL — ABNORMAL HIGH (ref 70–99)
Glucose, Bld: 129 mg/dL — ABNORMAL HIGH (ref 70–99)
HCT: 36 % (ref 36.0–46.0)
HCT: 32 % — ABNORMAL LOW (ref 36.0–46.0)
HCT: 25 % — ABNORMAL LOW (ref 36.0–46.0)
HCT: 26 % — ABNORMAL LOW (ref 36.0–46.0)
HCT: 29 % — ABNORMAL LOW (ref 36.0–46.0)
HCT: 27 % — ABNORMAL LOW (ref 36.0–46.0)
Hemoglobin: 12.2 g/dL (ref 12.0–15.0)
Hemoglobin: 10.9 g/dL — ABNORMAL LOW (ref 12.0–15.0)
Hemoglobin: 8.5 g/dL — ABNORMAL LOW (ref 12.0–15.0)
Hemoglobin: 8.8 g/dL — ABNORMAL LOW (ref 12.0–15.0)
Hemoglobin: 9.9 g/dL — ABNORMAL LOW (ref 12.0–15.0)
Hemoglobin: 9.2 g/dL — ABNORMAL LOW (ref 12.0–15.0)
Potassium: 4.5 mEq/L (ref 3.5–5.1)
Potassium: 4.2 mEq/L (ref 3.5–5.1)
Potassium: 3.8 mEq/L (ref 3.5–5.1)
Potassium: 5.3 mEq/L — ABNORMAL HIGH (ref 3.5–5.1)
Potassium: 4.3 mEq/L (ref 3.5–5.1)
Potassium: 3.3 mEq/L — ABNORMAL LOW (ref 3.5–5.1)
Sodium: 134 mEq/L — ABNORMAL LOW (ref 135–145)
Sodium: 135 mEq/L (ref 135–145)
Sodium: 132 mEq/L — ABNORMAL LOW (ref 135–145)
Sodium: 133 mEq/L — ABNORMAL LOW (ref 135–145)
Sodium: 137 mEq/L (ref 135–145)
Sodium: 139 mEq/L (ref 135–145)

## 2013-03-10 LAB — CREATININE, SERUM
Creatinine, Ser: 0.9 mg/dL (ref 0.50–1.10)
GFR calc Af Amer: 78 mL/min — ABNORMAL LOW (ref >90)
GFR calc non Af Amer: 67 mL/min — ABNORMAL LOW (ref >90)

## 2013-03-10 LAB — CBC
HCT: 28.4 % — ABNORMAL LOW (ref 36.0–46.0)
HCT: 30.6 % — ABNORMAL LOW (ref 36.0–46.0)
Hemoglobin: 10 g/dL — ABNORMAL LOW (ref 12.0–15.0)
Hemoglobin: 11 g/dL — ABNORMAL LOW (ref 12.0–15.0)
MCH: 29.6 pg (ref 26.0–34.0)
MCH: 30.3 pg (ref 26.0–34.0)
MCHC: 35.2 g/dL (ref 30.0–36.0)
MCHC: 35.9 g/dL (ref 30.0–36.0)
MCV: 84 fL (ref 78.0–100.0)
MCV: 84.3 fL (ref 78.0–100.0)
Platelets: 127 10*3/uL — ABNORMAL LOW (ref 150–400)
Platelets: 190 10*3/uL (ref 150–400)
RBC: 3.38 MIL/uL — ABNORMAL LOW (ref 3.87–5.11)
RBC: 3.63 MIL/uL — ABNORMAL LOW (ref 3.87–5.11)
RDW: 12.2 % (ref 11.5–15.5)
RDW: 12.3 % (ref 11.5–15.5)
WBC: 6.6 10*3/uL (ref 4.0–10.5)
WBC: 15.1 10*3/uL — ABNORMAL HIGH (ref 4.0–10.5)

## 2013-03-10 LAB — PROTIME-INR
INR: 1.39 (ref 0.00–1.49)
Prothrombin Time: 16.7 seconds — ABNORMAL HIGH (ref 11.6–15.2)

## 2013-03-10 LAB — APTT: aPTT: 29 seconds (ref 24–37)

## 2013-03-10 LAB — HEMOGLOBIN AND HEMATOCRIT, BLOOD
HCT: 24.2 % — ABNORMAL LOW (ref 36.0–46.0)
Hemoglobin: 8.7 g/dL — ABNORMAL LOW (ref 12.0–15.0)

## 2013-03-10 LAB — PLATELET COUNT: Platelets: 181 10*3/uL (ref 150–400)

## 2013-03-10 LAB — MAGNESIUM: Magnesium: 2.8 mg/dL — ABNORMAL HIGH (ref 1.5–2.5)

## 2013-03-10 SURGERY — CORONARY ARTERY BYPASS GRAFTING (CABG)
Anesthesia: General | Site: Leg Upper | Laterality: Right | Wound class: Clean

## 2013-03-10 MED ORDER — LACTATED RINGERS IV SOLN
INTRAVENOUS | Status: DC | PRN
  Administered 2013-03-10: 08:00:00 via INTRAVENOUS

## 2013-03-10 MED ORDER — 0.9 % SODIUM CHLORIDE (POUR BTL) OPTIME
TOPICAL | Status: DC | PRN
  Administered 2013-03-10: 1000 mL

## 2013-03-10 MED ORDER — MIDAZOLAM HCL 2 MG/2ML IJ SOLN
2.0000 mg | INTRAMUSCULAR | Status: DC | PRN
  Administered 2013-03-10: 1 mg via INTRAVENOUS
  Filled 2013-03-10 (×2): qty 2

## 2013-03-10 MED ORDER — FAMOTIDINE IN NACL 20-0.9 MG/50ML-% IV SOLN
20.0000 mg | Freq: Two times a day (BID) | INTRAVENOUS | Status: AC
  Administered 2013-03-10 – 2013-03-11 (×2): 20 mg via INTRAVENOUS
  Filled 2013-03-10: qty 50

## 2013-03-10 MED ORDER — VECURONIUM BROMIDE 10 MG IV SOLR
INTRAVENOUS | Status: DC | PRN
  Administered 2013-03-10: 5 mg via INTRAVENOUS
  Administered 2013-03-10: 10 mg via INTRAVENOUS

## 2013-03-10 MED ORDER — ARTIFICIAL TEARS OP OINT
TOPICAL_OINTMENT | OPHTHALMIC | Status: DC | PRN
  Administered 2013-03-10: 1 via OPHTHALMIC

## 2013-03-10 MED ORDER — ACETAMINOPHEN 160 MG/5ML PO SOLN: 975.0000 mg | Freq: Four times a day (QID) | ORAL | Status: DC

## 2013-03-10 MED ORDER — LACTATED RINGERS IV SOLN
INTRAVENOUS | Status: DC | PRN
  Administered 2013-03-10 (×2): via INTRAVENOUS

## 2013-03-10 MED ORDER — SODIUM CHLORIDE 0.9 % IJ SOLN
3.0000 mL | Freq: Two times a day (BID) | INTRAMUSCULAR | Status: DC
  Administered 2013-03-11 – 2013-03-13 (×2): 3 mL via INTRAVENOUS

## 2013-03-10 MED ORDER — SODIUM CHLORIDE 0.9 % IV SOLN: 250.0000 mL | INTRAVENOUS | Status: DC

## 2013-03-10 MED ORDER — CHLORHEXIDINE GLUCONATE 4 % EX LIQD: 30.0000 mL | CUTANEOUS | Status: DC

## 2013-03-10 MED ORDER — PLASMA-LYTE 148 IV SOLN
INTRAVENOUS | Status: DC | PRN
  Administered 2013-03-10: 08:00:00 via INTRAVASCULAR

## 2013-03-10 MED ORDER — CALCIUM CHLORIDE 10 % IV SOLN
INTRAVENOUS | Status: DC | PRN
  Administered 2013-03-10 (×2): 100 mg via INTRAVENOUS

## 2013-03-10 MED ORDER — MORPHINE SULFATE 2 MG/ML IJ SOLN
1.0000 mg | INTRAMUSCULAR | Status: AC | PRN
  Administered 2013-03-10 (×4): 2 mg via INTRAVENOUS

## 2013-03-10 MED ORDER — MORPHINE SULFATE 2 MG/ML IJ SOLN
2.0000 mg | INTRAMUSCULAR | Status: DC | PRN
  Administered 2013-03-11 (×2): 2 mg via INTRAVENOUS
  Filled 2013-03-10 (×4): qty 1
  Filled 2013-03-10: qty 2
  Filled 2013-03-10: qty 1

## 2013-03-10 MED ORDER — ALBUMIN HUMAN 5 % IV SOLN
INTRAVENOUS | Status: DC | PRN
  Administered 2013-03-10 (×2): via INTRAVENOUS

## 2013-03-10 MED ORDER — PANTOPRAZOLE SODIUM 40 MG PO TBEC
40.0000 mg | DELAYED_RELEASE_TABLET | Freq: Every day | ORAL | Status: DC
  Administered 2013-03-12 – 2013-03-15 (×3): 40 mg via ORAL
  Filled 2013-03-10 (×3): qty 1

## 2013-03-10 MED ORDER — OXYCODONE HCL 5 MG PO TABS
5.0000 mg | ORAL_TABLET | ORAL | Status: DC | PRN
  Administered 2013-03-10 – 2013-03-15 (×18): 10 mg via ORAL
  Filled 2013-03-10 (×19): qty 2

## 2013-03-10 MED ORDER — SODIUM CHLORIDE 0.45 % IV SOLN: INTRAVENOUS | Status: DC

## 2013-03-10 MED ORDER — LACTATED RINGERS IV SOLN: INTRAVENOUS | Status: DC

## 2013-03-10 MED ORDER — ACETAMINOPHEN 500 MG PO TABS
1000.0000 mg | ORAL_TABLET | Freq: Four times a day (QID) | ORAL | Status: DC
  Administered 2013-03-11 – 2013-03-15 (×15): 1000 mg via ORAL
  Filled 2013-03-10 (×19): qty 2

## 2013-03-10 MED ORDER — LACTATED RINGERS IV SOLN: 500.0000 mL | Freq: Once | INTRAVENOUS | Status: AC | PRN

## 2013-03-10 MED ORDER — BISACODYL 10 MG RE SUPP: 10.0000 mg | Freq: Every day | RECTAL | Status: DC

## 2013-03-10 MED ORDER — PHENYLEPHRINE HCL 10 MG/ML IJ SOLN
0.0000 ug/min | INTRAVENOUS | Status: DC
  Filled 2013-03-10: qty 2

## 2013-03-10 MED ORDER — SODIUM CHLORIDE 0.9 % IJ SOLN
OROMUCOSAL | Status: DC | PRN
  Administered 2013-03-10 (×2): via TOPICAL

## 2013-03-10 MED ORDER — POTASSIUM CHLORIDE 10 MEQ/50ML IV SOLN
10.0000 meq | INTRAVENOUS | Status: AC
  Administered 2013-03-10 (×3): 10 meq via INTRAVENOUS

## 2013-03-10 MED ORDER — VANCOMYCIN HCL 1000 MG IV SOLR
INTRAVENOUS | Status: DC | PRN
  Administered 2013-03-10: 09:00:00

## 2013-03-10 MED ORDER — SODIUM CHLORIDE 0.9 % IV SOLN
INTRAVENOUS | Status: DC
  Administered 2013-03-10: 12:00:00 via INTRAVENOUS

## 2013-03-10 MED ORDER — MIDAZOLAM HCL 5 MG/5ML IJ SOLN
INTRAMUSCULAR | Status: DC | PRN
  Administered 2013-03-10 (×2): 1 mg via INTRAVENOUS
  Administered 2013-03-10: 4 mg via INTRAVENOUS
  Administered 2013-03-10: 1 mg via INTRAVENOUS
  Administered 2013-03-10: 5 mg via INTRAVENOUS
  Administered 2013-03-10 (×3): 1 mg via INTRAVENOUS
  Administered 2013-03-10: 2 mg via INTRAVENOUS

## 2013-03-10 MED ORDER — SODIUM BICARBONATE 8.4 % IV SOLN
50.0000 meq | Freq: Once | INTRAVENOUS | Status: AC
  Administered 2013-03-10: 50 meq via INTRAVENOUS

## 2013-03-10 MED ORDER — SODIUM CHLORIDE 0.9 % IJ SOLN: 3.0000 mL | INTRAMUSCULAR | Status: DC | PRN

## 2013-03-10 MED ORDER — ASPIRIN 81 MG PO CHEW: 324.0000 mg | CHEWABLE_TABLET | Freq: Every day | ORAL | Status: DC

## 2013-03-10 MED ORDER — MAGNESIUM SULFATE 40 MG/ML IJ SOLN
4.0000 g | Freq: Once | INTRAMUSCULAR | Status: AC
  Administered 2013-03-10: 4 g via INTRAVENOUS
  Filled 2013-03-10: qty 100

## 2013-03-10 MED ORDER — METOPROLOL TARTRATE 12.5 MG HALF TABLET
12.5000 mg | ORAL_TABLET | Freq: Two times a day (BID) | ORAL | Status: DC
  Filled 2013-03-10 (×3): qty 1

## 2013-03-10 MED ORDER — DOCUSATE SODIUM 100 MG PO CAPS
200.0000 mg | ORAL_CAPSULE | Freq: Every day | ORAL | Status: DC
  Administered 2013-03-11 – 2013-03-12 (×2): 200 mg via ORAL
  Filled 2013-03-10 (×5): qty 2

## 2013-03-10 MED ORDER — ONDANSETRON HCL 4 MG/2ML IJ SOLN
4.0000 mg | Freq: Four times a day (QID) | INTRAMUSCULAR | Status: DC | PRN
  Administered 2013-03-10 – 2013-03-12 (×5): 4 mg via INTRAVENOUS
  Filled 2013-03-10 (×6): qty 2

## 2013-03-10 MED ORDER — HEPARIN SODIUM (PORCINE) 1000 UNIT/ML IJ SOLN
INTRAMUSCULAR | Status: DC | PRN
  Administered 2013-03-10: 23000 [IU] via INTRAVENOUS
  Administered 2013-03-10: 3000 [IU] via INTRAVENOUS

## 2013-03-10 MED ORDER — PROPOFOL 10 MG/ML IV BOLUS
INTRAVENOUS | Status: DC | PRN
  Administered 2013-03-10: 100 mg via INTRAVENOUS

## 2013-03-10 MED ORDER — METOPROLOL TARTRATE 1 MG/ML IV SOLN: 2.5000 mg | INTRAVENOUS | Status: DC | PRN

## 2013-03-10 MED ORDER — METOPROLOL TARTRATE 25 MG/10 ML ORAL SUSPENSION
12.5000 mg | Freq: Two times a day (BID) | ORAL | Status: DC
  Filled 2013-03-10 (×3): qty 5

## 2013-03-10 MED ORDER — DEXMEDETOMIDINE HCL IN NACL 200 MCG/50ML IV SOLN: 0.1000 ug/kg/h | INTRAVENOUS | Status: DC

## 2013-03-10 MED ORDER — FENTANYL CITRATE 0.05 MG/ML IJ SOLN
INTRAMUSCULAR | Status: DC | PRN
  Administered 2013-03-10: 50 ug via INTRAVENOUS
  Administered 2013-03-10 (×2): 100 ug via INTRAVENOUS
  Administered 2013-03-10: 500 ug via INTRAVENOUS
  Administered 2013-03-10: 250 ug via INTRAVENOUS
  Administered 2013-03-10 (×2): 50 ug via INTRAVENOUS
  Administered 2013-03-10: 200 ug via INTRAVENOUS
  Administered 2013-03-10: 50 ug via INTRAVENOUS
  Administered 2013-03-10: 100 ug via INTRAVENOUS
  Administered 2013-03-10: 50 ug via INTRAVENOUS
  Administered 2013-03-10 (×2): 500 ug via INTRAVENOUS

## 2013-03-10 MED ORDER — ALBUMIN HUMAN 5 % IV SOLN
250.0000 mL | INTRAVENOUS | Status: AC | PRN
  Administered 2013-03-10: 250 mL via INTRAVENOUS

## 2013-03-10 MED ORDER — BISACODYL 5 MG PO TBEC
10.0000 mg | DELAYED_RELEASE_TABLET | Freq: Every day | ORAL | Status: DC
  Administered 2013-03-11 – 2013-03-12 (×2): 10 mg via ORAL
  Filled 2013-03-10 (×2): qty 2

## 2013-03-10 MED ORDER — ACETAMINOPHEN 10 MG/ML IV SOLN
1000.0000 mg | Freq: Once | INTRAVENOUS | Status: AC
  Administered 2013-03-10: 1000 mg via INTRAVENOUS
  Filled 2013-03-10: qty 100

## 2013-03-10 MED ORDER — VANCOMYCIN HCL IN DEXTROSE 1-5 GM/200ML-% IV SOLN
1000.0000 mg | Freq: Once | INTRAVENOUS | Status: AC
  Administered 2013-03-10: 1000 mg via INTRAVENOUS
  Filled 2013-03-10: qty 200

## 2013-03-10 MED ORDER — SODIUM CHLORIDE 0.9 % IV SOLN
INTRAVENOUS | Status: DC
  Administered 2013-03-10: 2.9 [IU]/h via INTRAVENOUS
  Administered 2013-03-11: 07:00:00 via INTRAVENOUS
  Filled 2013-03-10 (×4): qty 1

## 2013-03-10 MED ORDER — ASPIRIN EC 325 MG PO TBEC
325.0000 mg | DELAYED_RELEASE_TABLET | Freq: Every day | ORAL | Status: DC
  Administered 2013-03-11 – 2013-03-15 (×5): 325 mg via ORAL
  Filled 2013-03-10 (×5): qty 1

## 2013-03-10 MED ORDER — SODIUM CHLORIDE 0.9 % IV SOLN: INTRAVENOUS | Status: DC

## 2013-03-10 MED ORDER — INSULIN REGULAR BOLUS VIA INFUSION
0.0000 [IU] | Freq: Three times a day (TID) | INTRAVENOUS | Status: DC
  Filled 2013-03-10: qty 10

## 2013-03-10 MED ORDER — CEFUROXIME SODIUM 1.5 G IJ SOLR
1.5000 g | Freq: Two times a day (BID) | INTRAMUSCULAR | Status: AC
  Administered 2013-03-10 – 2013-03-12 (×4): 1.5 g via INTRAVENOUS
  Filled 2013-03-10 (×4): qty 1.5

## 2013-03-10 MED ORDER — NITROGLYCERIN IN D5W 200-5 MCG/ML-% IV SOLN
0.0000 ug/min | INTRAVENOUS | Status: DC
  Administered 2013-03-11: 70 ug/min via INTRAVENOUS
  Filled 2013-03-10: qty 250

## 2013-03-10 SURGICAL SUPPLY — 124 items
ADAPTER CARDIO PERF ANTE/RETRO (ADAPTER) IMPLANT
ADPR PRFSN 84XANTGRD RTRGD (ADAPTER)
APL SKNCLS STERI-STRIP NONHPOA (GAUZE/BANDAGES/DRESSINGS)
APPLIER CLIP 9.375 MED OPEN (MISCELLANEOUS)
APPLIER CLIP 9.375 SM OPEN (CLIP)
APR CLP MED 9.3 20 MLT OPN (MISCELLANEOUS)
APR CLP SM 9.3 20 MLT OPN (CLIP)
ATTRACTOMAT 16X20 MAGNETIC DRP (DRAPES) ×4 IMPLANT
BAG DECANTER FOR FLEXI CONT (MISCELLANEOUS) ×4 IMPLANT
BANDAGE ELASTIC 4 VELCRO ST LF (GAUZE/BANDAGES/DRESSINGS) ×4 IMPLANT
BANDAGE ELASTIC 6 VELCRO ST LF (GAUZE/BANDAGES/DRESSINGS) ×4 IMPLANT
BANDAGE GAUZE ELAST BULKY 4 IN (GAUZE/BANDAGES/DRESSINGS) ×4 IMPLANT
BASKET HEART (ORDER IN 25'S) (MISCELLANEOUS) ×1
BASKET HEART (ORDER IN 25S) (MISCELLANEOUS) ×3 IMPLANT
BENZOIN TINCTURE PRP APPL 2/3 (GAUZE/BANDAGES/DRESSINGS) ×3 IMPLANT
BLADE STERNUM SYSTEM 6 (BLADE) ×4 IMPLANT
BLADE SURG 11 STRL SS (BLADE) ×1 IMPLANT
BLADE SURG ROTATE 9660 (MISCELLANEOUS) ×1 IMPLANT
CANISTER SUCTION 2500CC (MISCELLANEOUS) ×4 IMPLANT
CANNULA EZ GLIDE AORTIC 21FR (CANNULA) ×4 IMPLANT
CANNULA GUNDRY RCSP 15FR (MISCELLANEOUS) IMPLANT
CANNULA VENOUS LOW PROF 34X46 (CANNULA) ×4 IMPLANT
CATH CPB KIT OWEN (MISCELLANEOUS) ×4 IMPLANT
CATH THORACIC 28FR (CATHETERS) IMPLANT
CATH THORACIC 28FR RT ANG (CATHETERS) IMPLANT
CATH THORACIC 36FR (CATHETERS) ×4 IMPLANT
CATH THORACIC 36FR RT ANG (CATHETERS) ×4 IMPLANT
CLIP APPLIE 9.375 MED OPEN (MISCELLANEOUS) IMPLANT
CLIP APPLIE 9.375 SM OPEN (CLIP) IMPLANT
CLIP FOGARTY SPRING 6M (CLIP) IMPLANT
CLIP TI MEDIUM 24 (CLIP) IMPLANT
CLIP TI WIDE RED SMALL 24 (CLIP) IMPLANT
CLOTH BEACON ORANGE TIMEOUT ST (SAFETY) ×4 IMPLANT
CONN ST 1/4X3/8  BEN (MISCELLANEOUS) ×2
CONN ST 1/4X3/8 BEN (MISCELLANEOUS) IMPLANT
CONN Y 3/8X3/8X3/8  BEN (MISCELLANEOUS)
CONN Y 3/8X3/8X3/8 BEN (MISCELLANEOUS) IMPLANT
COVER MAYO STAND STRL (DRAPES) ×1 IMPLANT
COVER SURGICAL LIGHT HANDLE (MISCELLANEOUS) ×4 IMPLANT
CRADLE DONUT ADULT HEAD (MISCELLANEOUS) ×4 IMPLANT
DRAIN CHANNEL 32F RND 10.7 FF (WOUND CARE) ×6 IMPLANT
DRAPE CARDIOVASCULAR INCISE (DRAPES) ×4
DRAPE SLUSH/WARMER DISC (DRAPES) ×4 IMPLANT
DRAPE SRG 135X102X78XABS (DRAPES) ×3 IMPLANT
DRSG COVADERM 4X14 (GAUZE/BANDAGES/DRESSINGS) ×4 IMPLANT
ELECT REM PT RETURN 9FT ADLT (ELECTROSURGICAL) ×8
ELECTRODE REM PT RTRN 9FT ADLT (ELECTROSURGICAL) ×6 IMPLANT
GLOVE BIO SURGEON STRL SZ 6 (GLOVE) IMPLANT
GLOVE BIO SURGEON STRL SZ 6.5 (GLOVE) IMPLANT
GLOVE BIO SURGEON STRL SZ7 (GLOVE) IMPLANT
GLOVE BIO SURGEON STRL SZ7.5 (GLOVE) IMPLANT
GLOVE BIOGEL PI IND STRL 6 (GLOVE) IMPLANT
GLOVE BIOGEL PI IND STRL 6.5 (GLOVE) IMPLANT
GLOVE BIOGEL PI IND STRL 7.0 (GLOVE) IMPLANT
GLOVE BIOGEL PI INDICATOR 6 (GLOVE) ×2
GLOVE BIOGEL PI INDICATOR 6.5 (GLOVE) ×5
GLOVE BIOGEL PI INDICATOR 7.0 (GLOVE) ×3
GLOVE EUDERMIC 7 POWDERFREE (GLOVE) IMPLANT
GLOVE ORTHO TXT STRL SZ7.5 (GLOVE) ×8 IMPLANT
GOWN STRL NON-REIN LRG LVL3 (GOWN DISPOSABLE) ×16 IMPLANT
HEMOSTAT POWDER SURGIFOAM 1G (HEMOSTASIS) ×14 IMPLANT
INSERT FOGARTY 61MM (MISCELLANEOUS) IMPLANT
INSERT FOGARTY XLG (MISCELLANEOUS) ×4 IMPLANT
KIT BASIN OR (CUSTOM PROCEDURE TRAY) ×4 IMPLANT
KIT ROOM TURNOVER OR (KITS) ×4 IMPLANT
KIT SUCTION CATH 14FR (SUCTIONS) ×18 IMPLANT
KIT VASOVIEW W/TROCAR VH 2000 (KITS) ×4 IMPLANT
LEAD PACING MYOCARDI (MISCELLANEOUS) ×4 IMPLANT
MARKER GRAFT CORONARY BYPASS (MISCELLANEOUS) ×12 IMPLANT
NS IRRIG 1000ML POUR BTL (IV SOLUTION) ×21 IMPLANT
PACK OPEN HEART (CUSTOM PROCEDURE TRAY) ×4 IMPLANT
PAD ARMBOARD 7.5X6 YLW CONV (MISCELLANEOUS) ×4 IMPLANT
PAD ELECT DEFIB RADIOL ZOLL (MISCELLANEOUS) ×4 IMPLANT
PENCIL BUTTON HOLSTER BLD 10FT (ELECTRODE) ×4 IMPLANT
PUNCH AORTIC ROTATE 4.0MM (MISCELLANEOUS) ×1 IMPLANT
PUNCH AORTIC ROTATE 4.5MM 8IN (MISCELLANEOUS) IMPLANT
PUNCH AORTIC ROTATE 5MM 8IN (MISCELLANEOUS) IMPLANT
SET CARDIOPLEGIA MPS 5001102 (MISCELLANEOUS) ×1 IMPLANT
SOLUTION ANTI FOG 6CC (MISCELLANEOUS) IMPLANT
SPONGE GAUZE 4X4 12PLY (GAUZE/BANDAGES/DRESSINGS) ×7 IMPLANT
SPONGE LAP 18X18 X RAY DECT (DISPOSABLE) ×1 IMPLANT
SPONGE LAP 4X18 X RAY DECT (DISPOSABLE) IMPLANT
SUT BONE WAX W31G (SUTURE) ×4 IMPLANT
SUT ETHIBOND X763 2 0 SH 1 (SUTURE) ×8 IMPLANT
SUT MNCRL AB 3-0 PS2 18 (SUTURE) ×8 IMPLANT
SUT MNCRL AB 4-0 PS2 18 (SUTURE) ×1 IMPLANT
SUT PDS AB 1 CTX 36 (SUTURE) ×8 IMPLANT
SUT PROLENE 2 0 SH DA (SUTURE) IMPLANT
SUT PROLENE 3 0 SH DA (SUTURE) ×4 IMPLANT
SUT PROLENE 3 0 SH1 36 (SUTURE) IMPLANT
SUT PROLENE 4 0 RB 1 (SUTURE) ×8
SUT PROLENE 4 0 SH DA (SUTURE) IMPLANT
SUT PROLENE 4-0 RB1 .5 CRCL 36 (SUTURE) IMPLANT
SUT PROLENE 5 0 C 1 36 (SUTURE) IMPLANT
SUT PROLENE 6 0 C 1 30 (SUTURE) ×2 IMPLANT
SUT PROLENE 7.0 RB 3 (SUTURE) ×12 IMPLANT
SUT PROLENE 8 0 BV175 6 (SUTURE) IMPLANT
SUT PROLENE BLUE 7 0 (SUTURE) ×5 IMPLANT
SUT PROLENE POLY MONO (SUTURE) IMPLANT
SUT SILK  1 MH (SUTURE) ×2
SUT SILK 1 MH (SUTURE) ×3 IMPLANT
SUT STEEL 6MS V (SUTURE) ×1 IMPLANT
SUT STEEL STERNAL CCS#1 18IN (SUTURE) IMPLANT
SUT STEEL SZ 6 DBL 3X14 BALL (SUTURE) ×1 IMPLANT
SUT VIC AB 1 CTX 36 (SUTURE)
SUT VIC AB 1 CTX36XBRD ANBCTR (SUTURE) IMPLANT
SUT VIC AB 2-0 CT1 27 (SUTURE)
SUT VIC AB 2-0 CT1 TAPERPNT 27 (SUTURE) IMPLANT
SUT VIC AB 2-0 CTX 27 (SUTURE) IMPLANT
SUT VIC AB 3-0 SH 27 (SUTURE)
SUT VIC AB 3-0 SH 27X BRD (SUTURE) IMPLANT
SUT VIC AB 3-0 X1 27 (SUTURE) IMPLANT
SUT VICRYL 4-0 PS2 18IN ABS (SUTURE) IMPLANT
SUTURE E-PAK OPEN HEART (SUTURE) ×4 IMPLANT
SYSTEM SAHARA CHEST DRAIN ATS (WOUND CARE) ×5 IMPLANT
TAPE CLOTH SURG 4X10 WHT LF (GAUZE/BANDAGES/DRESSINGS) ×1 IMPLANT
TAPE PAPER 2X10 WHT MICROPORE (GAUZE/BANDAGES/DRESSINGS) ×1 IMPLANT
TOWEL OR 17X24 6PK STRL BLUE (TOWEL DISPOSABLE) ×8 IMPLANT
TOWEL OR 17X26 10 PK STRL BLUE (TOWEL DISPOSABLE) ×8 IMPLANT
TRAY FOLEY IC TEMP SENS 14FR (CATHETERS) ×4 IMPLANT
TUBE SUCT INTRACARD DLP 20F (MISCELLANEOUS) ×4 IMPLANT
TUBING INSUFFLATION 10FT LAP (TUBING) ×4 IMPLANT
UNDERPAD 30X30 INCONTINENT (UNDERPADS AND DIAPERS) ×4 IMPLANT
WATER STERILE IRR 1000ML POUR (IV SOLUTION) ×8 IMPLANT

## 2013-03-10 NOTE — Brief Op Note (Addendum)
03/10/2013  11:18 AM  PATIENT:  Darlene Berry  62 y.o. female  PRE-OPERATIVE DIAGNOSIS:  CAD  POST-OPERATIVE DIAGNOSIS:  CAD  PROCEDURE:  INTRAOPERATIVE TRANSESOPHAGEAL ECHOCARDIOGRAM , MEDIAN STERNOTOMY for CORONARY ARTERY BYPASS GRAFTING (CABG)  x2 (LIMA to LAD and SVG to distal RCA) with EVH from the RIGHT THIGH.  SURGEON:    Purcell Nails, MD  ASSISTANTS:  Ardelle Balls, PA-C  ANESTHESIA:   Hart Robinsons, MD  CROSSCLAMP TIME:   72'  CARDIOPULMONARY BYPASS TIME: 70'  FINDINGS:  Normal LV function  Good quality LIMA and SVG conduit for grafting  Good quality target vessels for grafting  LCx too small for grafting  COMPLICATIONS: none  PRE OP WEIGHT: 71 kg  PATIENT DISPOSITION:   TO SICU IN STABLE CONDITION  [name] 03/10/2013 12:19 PM

## 2013-03-10 NOTE — Progress Notes (Signed)
Patient ID: Darlene Berry, female   DOB: 04/09/1951, 62 y.o.   MRN: [id number] EVENING ROUNDS NOTE :     [address]       [name] [address]             [phone]                 Day of Surgery Procedure(s) (LRB): CORONARY ARTERY BYPASS GRAFTING (CABG) (N/A) INTRAOPERATIVE TRANSESOPHAGEAL ECHOCARDIOGRAM (N/A) ENDOVEIN HARVEST OF GREATER SAPHENOUS VEIN (Right)  Total Length of Stay:  LOS: 0 days  BP 147/70  Pulse 79  Temp(Src) 96.4 F (35.8 C) (Core (Comment))  Resp 19  Wt 157 lb (71.215 kg)  BMI 28.71 kg/m2  SpO2 100%  .Intake/Output     07/16 0701 - 07/17 0700   I.V. (mL/kg) 3517.9 (49.4)   Blood 300   NG/GT 30   IV Piggyback 1130   Total Intake(mL/kg) 4977.9 (69.9)   Urine (mL/kg/hr) 2125 (2.5)   Blood 1150 (1.3)   Chest Tube 180 (0.2)   Total Output 3455   Net +1522.9         . sodium chloride Stopped (03/10/13 1346)  . sodium chloride Stopped (03/10/13 1315)  . [START ON 03/11/2013] sodium chloride    . dexmedetomidine Stopped (03/10/13 1500)  . insulin (NOVOLIN-R) infusion 6.4 Units/hr (03/10/13 1800)  . lactated ringers 20 mL/hr at 03/10/13 1600  . nitroGLYCERIN Stopped (03/10/13 1315)  . phenylephrine (NEO-SYNEPHRINE) Adult infusion 10 mcg/min (03/10/13 1600)     Lab Results  Component Value Date   WBC 6.6 03/10/2013   HGB 9.2* 03/10/2013   HCT 27.0* 03/10/2013   PLT 127* 03/10/2013   GLUCOSE 129* 03/10/2013   ALT 49* 03/08/2013   AST 43* 03/08/2013   NA 139 03/10/2013   K 3.3* 03/10/2013   CL 96 03/08/2013   CREATININE 0.87 03/08/2013   BUN 16 03/08/2013   CO2 24 03/08/2013   INR 1.39 03/10/2013   HGBA1C 6.6* 03/08/2013   Now extubated , not bleeding stable  Delight Ovens MD  Beeper [phone] Office [phone] 03/10/2013 7:01 PM

## 2013-03-10 NOTE — Progress Notes (Signed)
Pt is awake and can follow commands. NIF -20, VC 650 with good effort. Pt has positive cuff leak and good cough/gag. ABG done. MD aware. Per MD place on full support for 1hr and draw ABG at 1700.

## 2013-03-10 NOTE — Interval H&P Note (Signed)
History and Physical Interval Note:  03/10/2013 8:16 AM  Darlene Berry  has presented today for surgery, with the diagnosis of CAD  The various methods of treatment have been discussed with the patient and family. After consideration of risks, benefits and other options for treatment, the patient has consented to  Procedure(s): CORONARY ARTERY BYPASS GRAFTING (CABG) (N/A) INTRAOPERATIVE TRANSESOPHAGEAL ECHOCARDIOGRAM (N/A) as a surgical intervention .  The patient's history has been reviewed, patient examined, no change in status, stable for surgery.  I have reviewed the patient's chart and labs.  Questions were answered to the patient's satisfaction.     [name]

## 2013-03-10 NOTE — Anesthesia Procedure Notes (Addendum)
Procedure Name: Intubation Date/Time: 03/10/2013 9:02 AM Performed by: Armandina Gemma Pre-anesthesia Checklist: Patient identified, Emergency Drugs available, Suction available and Patient being monitored Patient Re-evaluated:Patient Re-evaluated prior to inductionOxygen Delivery Method: Circle system utilized Preoxygenation: Pre-oxygenation with 100% oxygen Intubation Type: IV induction Ventilation: Mask ventilation without difficulty and Oral airway inserted - appropriate to patient size Laryngoscope Size: Hyacinth Meeker and 2 Grade View: Grade I Tube type: Oral Tube size: 8.0 mm Number of attempts: 1 Airway Equipment and Method: Stylet Placement Confirmation: ETT inserted through vocal cords under direct vision,  positive ETCO2 and breath sounds checked- equal and bilateral Secured at: 22 cm Tube secured with: Tape Dental Injury: Teeth and Oropharynx as per pre-operative assessment

## 2013-03-10 NOTE — Op Note (Signed)
CARDIOTHORACIC SURGERY OPERATIVE NOTE  Date of Procedure: 03/10/2013  Preoperative Diagnosis: Severe 3-vessel Coronary Artery Disease  Postoperative Diagnosis: Same  Procedure:    Coronary Artery Bypass Grafting x 2   Left Internal Mammary Artery to Distal Left Anterior Descending Coronary Artery  Saphenous Vein Graft to Distal Right Coronary Artery  Endoscopic Vein Harvest from Right Thigh  Surgeon: Salvatore Decent. 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 The patient has undergone cardiac catheterization twice previously in the distant past for occasional symptoms of chest pain. Over the past several months the patient has developed increasing episodes of substernal chest tightness which typically occurs with physical activity and relieved by rest. 3 weeks ago she was working in her garden and doing some fairly strenuous digging when she developed a particular severe episode of substernal chest tightness which radiated to the jaw and was associated with severe shortness of breath. She stopped and rested and laid down, and the symptoms gradually went away. She was seen by her primary care physician and referred to [facility] in [place] where she underwent diagnostic cardiac catheterization. She was found to have three-vessel coronary artery disease with normal left ventricular systolic function. It was recommended that she undergo surgical revascularization and she was seen in consultation by one  of the cardiothoracic surgeons. However, the patient insisted on referral to [facility] for definitive management. Because the patient's symptoms remained stable and her coronary anatomy was not felt to be critical, she was discharged from the hospital and referred for elective cardiothoracic surgical consultation.  The patient has been seen in consultation and counseled at length regarding the indications, risks and potential benefits of surgery.  All questions have been answered, and the patient provides full informed consent for the operation as described.     DETAILS OF THE OPERATIVE PROCEDURE  Preparation:  The patient is brought to the operating room on the above mentioned date and central monitoring was established by the anesthesia team including placement of Swan-Ganz catheter and radial arterial line. The patient is placed in the supine position on the operating table.  Intravenous antibiotics are administered. General endotracheal anesthesia is induced uneventfully. A Foley catheter is placed.  Baseline transesophageal echocardiogram was performed.  Findings were notable for normal LV size and function.  The patient's chest, abdomen, both groins, and both lower extremities are prepared and draped in a sterile manner. A time out procedure is performed.   Surgical Approach and Conduit Harvest:  A median sternotomy incision was performed and the left internal mammary artery is dissected from the chest wall and prepared for bypass grafting. The left internal mammary artery is notably good quality conduit. Simultaneously, saphenous vein is obtained from the patient's right thigh using endoscopic vein harvest technique. The saphenous vein is notably good quality conduit. After removal of the saphenous vein, the small surgical incisions in the lower extremity are closed with absorbable suture. Following systemic heparinization, the left internal mammary artery was transected  distally noted to have excellent flow.  Extracorporeal Cardiopulmonary Bypass and Myocardial Protection:  The pericardium is opened. The ascending aorta is normal in appearance. The ascending aorta and the right atrium are cannulated for cardioplegia bypass.  Adequate heparinization is verified.     The entire pre-bypass portion of the operation was notable for stable hemodynamics.  Cardiopulmonary bypass was begun and the surface of the heart is inspected. Distal target vessels are selected for coronary artery bypass grafting. The terminal branches of the left circumflex coronary artery were too small for grafting.  A cardioplegia cannula is placed in the ascending aorta.  A temperature probe was placed in the interventricular septum.  The patient is allowed to cool passively to [facility] systemic temperature.  The aortic cross clamp is applied and cold blood cardioplegia is delivered initially in an antegrade fashion through the aortic root.    Iced saline slush is applied for topical hypothermia.  The initial cardioplegic arrest is rapid with early diastolic arrest.  Repeat doses of cardioplegia are administered intermittently throughout the entire cross clamp portion of the operation through the aortic root and through subsequently placed vein grafts in order to maintain completely flat electrocardiogram and septal myocardial temperature below 15C.  Myocardial protection was felt to be excellent.  Coronary Artery Bypass Grafting:   The distal right coronary artery was grafted using a reversed saphenous vein graft in an end-to-side fashion.  At the site of distal anastomosis the target vessel was good quality and measured approximately 2.0 mm in diameter.  The distal left anterior coronary artery was grafted with the left internal mammary artery in an end-to-side fashion.  At the site of distal anastomosis the target vessel was good quality and measured approximately 1.4 mm in diameter.  All  proximal vein graft anastomoses were placed directly to the ascending aorta prior to removal of the aortic cross clamp.  The septal myocardial temperature rose rapidly after reperfusion of the left internal mammary artery graft.  The aortic cross clamp was removed after a total cross clamp time of 46 minutes.   Procedure Completion:  All proximal and distal coronary anastomoses were inspected for hemostasis and appropriate graft orientation. Epicardial pacing wires are fixed to the right ventricular outflow tract and to the right atrial appendage. The patient is rewarmed to 37C temperature. The patient is weaned and disconnected from cardiopulmonary bypass.  The patient's rhythm at separation from bypass was normal sinus.  The patient was weaned from cardioplegic bypass without any inotropic support. Total cardiopulmonary bypass time for the operation was 60 minutes.  Followup transesophageal echocardiogram performed after separation from bypass revealed no changes from the preoperative exam.  The aortic and venous cannula were removed uneventfully. Protamine was administered to reverse the anticoagulation. The mediastinum and pleural space were inspected for hemostasis and irrigated with saline solution. The mediastinum and both pleural spaces were drained using 4 chest tubes placed through separate stab incisions inferiorly.  The soft tissues anterior to the aorta were reapproximated loosely. The sternum is closed with double strength sternal wire. The soft tissues anterior to the sternum were closed in multiple layers and the skin is closed with a running subcuticular skin closure.  The post-bypass portion of the operation was notable for stable rhythm and hemodynamics.  No blood products were administered during the operation.   Disposition:  The patient tolerated the procedure well and is transported to the surgical intensive care in stable condition. There are no intraoperative  complications. All sponge instrument and needle counts are verified correct  at completion of the operation.    Salvatore Decent. Cornelius Moras MD 03/10/2013 12:23 PM

## 2013-03-10 NOTE — Anesthesia Postprocedure Evaluation (Signed)
  Anesthesia Post-op Note  Patient: Darlene Berry  Procedure(s) Performed: Procedure(s) with comments: CORONARY ARTERY BYPASS GRAFTING (CABG) (N/A) - x2 using right greater saphenous vein and left internal mammary.  INTRAOPERATIVE TRANSESOPHAGEAL ECHOCARDIOGRAM (N/A) ENDOVEIN HARVEST OF GREATER SAPHENOUS VEIN (Right)  Patient Location: SICU  Anesthesia Type:General  Level of Consciousness: sedated  Airway and Oxygen Therapy: Patient remains intubated per anesthesia plan and Patient placed on Ventilator (see vital sign flow sheet for setting)  Post-op Pain: none  Post-op Assessment: Post-op Vital signs reviewed, Patient's Cardiovascular Status Stable, Respiratory Function Stable, Patent Airway, No signs of Nausea or vomiting and Pain level controlled  Post-op Vital Signs: Reviewed and stable  Complications: No apparent anesthesia complications

## 2013-03-10 NOTE — Anesthesia Preprocedure Evaluation (Addendum)
Anesthesia Evaluation  Patient identified by MRN, date of birth, ID band Patient awake    Reviewed: Allergy & Precautions, H&P , NPO status , Patient's Chart, lab work & pertinent test results, reviewed documented beta blocker date and time   Airway Mallampati: II TM Distance: >3 FB Neck ROM: full    Dental  (+) Dental Advisory Given   Pulmonary sleep apnea , Current Smoker,  breath sounds clear to auscultation        Cardiovascular hypertension, On Medications + CAD and + Peripheral Vascular Disease Rhythm:regular     Neuro/Psych negative neurological ROS  negative psych ROS   GI/Hepatic negative GI ROS, Neg liver ROS, GERD-  Medicated and Controlled,  Endo/Other  diabetes, Insulin Dependent and Oral Hypoglycemic Agents  Renal/GU negative Renal ROS  negative genitourinary   Musculoskeletal   Abdominal   Peds  Hematology  (+) Blood dyscrasia, anemia ,   Anesthesia Other Findings See surgeon's H&P   Reproductive/Obstetrics negative OB ROS                          Anesthesia Physical Anesthesia Plan  ASA: III  Anesthesia Plan: General   Post-op Pain Management:    Induction: Intravenous  Airway Management Planned: Oral ETT  Additional Equipment: Arterial line, CVP, PA Cath, TEE and Ultrasound Guidance Line Placement  Intra-op Plan:   Post-operative Plan: Post-operative intubation/ventilation  Informed Consent: I have reviewed the patients History and Physical, chart, labs and discussed the procedure including the risks, benefits and alternatives for the proposed anesthesia with the patient or authorized representative who has indicated his/her understanding and acceptance.   Dental Advisory Given  Plan Discussed with: CRNA and Surgeon  Anesthesia Plan Comments:         Anesthesia Quick Evaluation

## 2013-03-10 NOTE — Transfer of Care (Signed)
Immediate Anesthesia Transfer of Care Note  Patient: Ranee P Grisanti  Procedure(s) Performed: Procedure(s) with comments: CORONARY ARTERY BYPASS GRAFTING (CABG) (N/A) - x2 using right greater saphenous vein and left internal mammary.  INTRAOPERATIVE TRANSESOPHAGEAL ECHOCARDIOGRAM (N/A) ENDOVEIN HARVEST OF GREATER SAPHENOUS VEIN (Right)  Patient Location: PACU and ICU  Anesthesia Type:General  Level of Consciousness: Patient remains intubated per anesthesia plan  Airway & Oxygen Therapy: Patient remains intubated per anesthesia plan and Patient placed on Ventilator (see vital sign flow sheet for setting)  Post-op Assessment: Report given to PACU RN and Post -op Vital signs reviewed and stable  Post vital signs: Reviewed and stable  Complications: No apparent anesthesia complications

## 2013-03-10 NOTE — Preoperative (Signed)
Beta Blockers   Reason not to administer Beta Blockers:Not Applicable 

## 2013-03-10 NOTE — Procedures (Signed)
Extubation Procedure Note  Patient Details:   Name: Darlene Berry DOB: [birth year] MRN: [id number]   Airway Documentation:     Evaluation  O2 sats: 100 Complications: No apparent complications Patient did tolerate procedure well. Bilateral Breath Sounds: Clear   Yes  Pt is awake and can follow commands. Pt has good cough/gag. Pt has positive cuff leak.  Per Dr Cornelius Moras ok to extubate. Extubated pt to 4L. Vitals stable. No stridor. BBS CLR Kandis Nab 03/10/2013, 6:26 PM

## 2013-03-11 ENCOUNTER — Inpatient Hospital Stay (HOSPITAL_COMMUNITY): Payer: Medicare Other

## 2013-03-11 LAB — CBC
HCT: 28.1 % — ABNORMAL LOW (ref 36.0–46.0)
HCT: 28.4 % — ABNORMAL LOW (ref 36.0–46.0)
Hemoglobin: 10 g/dL — ABNORMAL LOW (ref 12.0–15.0)
Hemoglobin: 9.9 g/dL — ABNORMAL LOW (ref 12.0–15.0)
MCH: 30 pg (ref 26.0–34.0)
MCH: 29.7 pg (ref 26.0–34.0)
MCHC: 35.6 g/dL (ref 30.0–36.0)
MCHC: 34.9 g/dL (ref 30.0–36.0)
MCV: 84.4 fL (ref 78.0–100.0)
MCV: 85.3 fL (ref 78.0–100.0)
Platelets: 175 10*3/uL (ref 150–400)
Platelets: 147 10*3/uL — ABNORMAL LOW (ref 150–400)
RBC: 3.33 MIL/uL — ABNORMAL LOW (ref 3.87–5.11)
RBC: 3.33 MIL/uL — ABNORMAL LOW (ref 3.87–5.11)
RDW: 12.5 % (ref 11.5–15.5)
RDW: 12.8 % (ref 11.5–15.5)
WBC: 13.2 10*3/uL — ABNORMAL HIGH (ref 4.0–10.5)
WBC: 13.5 10*3/uL — ABNORMAL HIGH (ref 4.0–10.5)

## 2013-03-11 LAB — MAGNESIUM
Magnesium: 2.3 mg/dL (ref 1.5–2.5)
Magnesium: 1.9 mg/dL (ref 1.5–2.5)

## 2013-03-11 LAB — POCT I-STAT, CHEM 8
BUN: 17 mg/dL (ref 6–23)
Calcium, Ion: 1.09 mmol/L — ABNORMAL LOW (ref 1.13–1.30)
Chloride: 100 mEq/L (ref 96–112)
Creatinine, Ser: 1 mg/dL (ref 0.50–1.10)
Glucose, Bld: 186 mg/dL — ABNORMAL HIGH (ref 70–99)
HCT: 29 % — ABNORMAL LOW (ref 36.0–46.0)
Hemoglobin: 9.9 g/dL — ABNORMAL LOW (ref 12.0–15.0)
Potassium: 4 mEq/L (ref 3.5–5.1)
Sodium: 134 mEq/L — ABNORMAL LOW (ref 135–145)
TCO2: 21 mmol/L (ref 0–100)

## 2013-03-11 LAB — POCT I-STAT 4, (NA,K, GLUC, HGB,HCT)
Glucose, Bld: 75 mg/dL (ref 70–99)
HCT: 12 % — ABNORMAL LOW (ref 36.0–46.0)
Hemoglobin: 4.1 g/dL — CL (ref 12.0–15.0)
Potassium: <2 mEq/L — CL (ref 3.5–5.1)
Sodium: 150 mEq/L — ABNORMAL HIGH (ref 135–145)

## 2013-03-11 LAB — BASIC METABOLIC PANEL
BUN: 16 mg/dL (ref 6–23)
CO2: 26 mEq/L (ref 19–32)
Calcium: 8 mg/dL — ABNORMAL LOW (ref 8.4–10.5)
Chloride: 102 mEq/L (ref 96–112)
Creatinine, Ser: 0.81 mg/dL (ref 0.50–1.10)
GFR calc Af Amer: 88 mL/min — ABNORMAL LOW (ref >90)
GFR calc non Af Amer: 76 mL/min — ABNORMAL LOW (ref >90)
Glucose, Bld: 129 mg/dL — ABNORMAL HIGH (ref 70–99)
Potassium: 3.9 mEq/L (ref 3.5–5.1)
Sodium: 135 mEq/L (ref 135–145)

## 2013-03-11 LAB — CREATININE, SERUM
Creatinine, Ser: 0.92 mg/dL (ref 0.50–1.10)
GFR calc Af Amer: 76 mL/min — ABNORMAL LOW (ref >90)
GFR calc non Af Amer: 65 mL/min — ABNORMAL LOW (ref >90)

## 2013-03-11 LAB — POCT I-STAT 3, ART BLOOD GAS (G3+)
Acid-base deficit: 4 mmol/L — ABNORMAL HIGH (ref 0.0–2.0)
Acid-base deficit: 1 mmol/L (ref 0.0–2.0)
Bicarbonate: 21.4 mEq/L (ref 20.0–24.0)
Bicarbonate: 24.5 mEq/L — ABNORMAL HIGH (ref 20.0–24.0)
O2 Saturation: 100 %
O2 Saturation: 94 %
Patient temperature: 37.1
Patient temperature: 36.8
TCO2: 23 mmol/L (ref 0–100)
TCO2: 26 mmol/L (ref 0–100)
pCO2 arterial: 38.4 mmHg (ref 35.0–45.0)
pCO2 arterial: 44.7 mmHg (ref 35.0–45.0)
pH, Arterial: 7.355 (ref 7.350–7.450)
pH, Arterial: 7.347 — ABNORMAL LOW (ref 7.350–7.450)
pO2, Arterial: 221 mmHg — ABNORMAL HIGH (ref 80.0–100.0)
pO2, Arterial: 72 mmHg — ABNORMAL LOW (ref 80.0–100.0)

## 2013-03-11 LAB — GLUCOSE, CAPILLARY
Glucose-Capillary: 102 mg/dL — ABNORMAL HIGH (ref 70–99)
Glucose-Capillary: 107 mg/dL — ABNORMAL HIGH (ref 70–99)
Glucose-Capillary: 108 mg/dL — ABNORMAL HIGH (ref 70–99)
Glucose-Capillary: 114 mg/dL — ABNORMAL HIGH (ref 70–99)
Glucose-Capillary: 119 mg/dL — ABNORMAL HIGH (ref 70–99)
Glucose-Capillary: 121 mg/dL — ABNORMAL HIGH (ref 70–99)
Glucose-Capillary: 123 mg/dL — ABNORMAL HIGH (ref 70–99)
Glucose-Capillary: 123 mg/dL — ABNORMAL HIGH (ref 70–99)
Glucose-Capillary: 124 mg/dL — ABNORMAL HIGH (ref 70–99)
Glucose-Capillary: 125 mg/dL — ABNORMAL HIGH (ref 70–99)
Glucose-Capillary: 128 mg/dL — ABNORMAL HIGH (ref 70–99)
Glucose-Capillary: 140 mg/dL — ABNORMAL HIGH (ref 70–99)
Glucose-Capillary: 145 mg/dL — ABNORMAL HIGH (ref 70–99)
Glucose-Capillary: 183 mg/dL — ABNORMAL HIGH (ref 70–99)
Glucose-Capillary: 183 mg/dL — ABNORMAL HIGH (ref 70–99)
Glucose-Capillary: 91 mg/dL (ref 70–99)
Glucose-Capillary: 92 mg/dL (ref 70–99)

## 2013-03-11 MED ORDER — BUPROPION HCL ER (SR) 150 MG PO TB12
150.0000 mg | ORAL_TABLET | Freq: Two times a day (BID) | ORAL | Status: DC
  Administered 2013-03-11 – 2013-03-15 (×9): 150 mg via ORAL
  Filled 2013-03-11 (×12): qty 1

## 2013-03-11 MED ORDER — FUROSEMIDE 10 MG/ML IJ SOLN
20.0000 mg | Freq: Four times a day (QID) | INTRAMUSCULAR | Status: AC
  Administered 2013-03-11 (×3): 20 mg via INTRAVENOUS
  Filled 2013-03-11 (×3): qty 2

## 2013-03-11 MED ORDER — ALPRAZOLAM 0.5 MG PO TABS
0.5000 mg | ORAL_TABLET | Freq: Two times a day (BID) | ORAL | Status: DC | PRN
  Administered 2013-03-13: 0.5 mg via ORAL
  Filled 2013-03-11 (×3): qty 1

## 2013-03-11 MED ORDER — KETOROLAC TROMETHAMINE 15 MG/ML IJ SOLN
15.0000 mg | Freq: Once | INTRAMUSCULAR | Status: AC
  Administered 2013-03-11: 15 mg via INTRAVENOUS
  Filled 2013-03-11: qty 1

## 2013-03-11 MED ORDER — METOCLOPRAMIDE HCL 5 MG/ML IJ SOLN
10.0000 mg | Freq: Four times a day (QID) | INTRAMUSCULAR | Status: AC
  Administered 2013-03-11 – 2013-03-12 (×4): 10 mg via INTRAVENOUS
  Filled 2013-03-11 (×4): qty 2

## 2013-03-11 MED ORDER — NITROFURANTOIN MACROCRYSTAL 50 MG PO CAPS
50.0000 mg | ORAL_CAPSULE | Freq: Every day | ORAL | Status: DC
  Administered 2013-03-11 – 2013-03-14 (×4): 50 mg via ORAL
  Filled 2013-03-11 (×6): qty 1

## 2013-03-11 MED ORDER — ATENOLOL 25 MG PO TABS
25.0000 mg | ORAL_TABLET | Freq: Two times a day (BID) | ORAL | Status: DC
  Administered 2013-03-11 – 2013-03-15 (×9): 25 mg via ORAL
  Filled 2013-03-11 (×10): qty 1

## 2013-03-11 MED ORDER — INSULIN DETEMIR 100 UNIT/ML ~~LOC~~ SOLN
24.0000 [IU] | Freq: Two times a day (BID) | SUBCUTANEOUS | Status: DC
  Administered 2013-03-11 (×2): 24 [IU] via SUBCUTANEOUS
  Filled 2013-03-11 (×4): qty 0.24

## 2013-03-11 MED ORDER — MIDAZOLAM HCL 2 MG/2ML IJ SOLN
2.0000 mg | Freq: Once | INTRAMUSCULAR | Status: AC
  Administered 2013-03-11: 1 mg via INTRAVENOUS

## 2013-03-11 MED ORDER — POTASSIUM CHLORIDE 10 MEQ/50ML IV SOLN
10.0000 meq | INTRAVENOUS | Status: AC
  Administered 2013-03-11 (×3): 10 meq via INTRAVENOUS

## 2013-03-11 MED ORDER — MORPHINE SULFATE 2 MG/ML IJ SOLN: 2.0000 mg | INTRAMUSCULAR | Status: DC | PRN

## 2013-03-11 MED ORDER — INSULIN ASPART 100 UNIT/ML ~~LOC~~ SOLN: 0.0000 [IU] | SUBCUTANEOUS | Status: DC

## 2013-03-11 MED ORDER — PROMETHAZINE HCL 25 MG/ML IJ SOLN
12.5000 mg | INTRAMUSCULAR | Status: DC | PRN
Start: 2013-03-11 — End: 2013-03-15
  Administered 2013-03-11 – 2013-03-13 (×3): 12.5 mg via INTRAVENOUS
  Filled 2013-03-11 (×4): qty 1

## 2013-03-11 MED ORDER — LEVALBUTEROL HCL 0.63 MG/3ML IN NEBU: 0.6300 mg | INHALATION_SOLUTION | Freq: Four times a day (QID) | RESPIRATORY_TRACT | Status: DC | PRN

## 2013-03-11 MED FILL — Magnesium Sulfate Inj 50%: INTRAMUSCULAR | Qty: 10 | Status: AC

## 2013-03-11 MED FILL — Electrolyte-R (PH 7.4) Solution: INTRAVENOUS | Qty: 1000 | Status: AC

## 2013-03-11 MED FILL — Sodium Chloride Irrigation Soln 0.9%: Qty: 3000 | Status: AC

## 2013-03-11 MED FILL — Heparin Sodium (Porcine) Inj 1000 Unit/ML: INTRAMUSCULAR | Qty: 30 | Status: AC

## 2013-03-11 MED FILL — Mannitol IV Soln 20%: INTRAVENOUS | Qty: 500 | Status: AC

## 2013-03-11 MED FILL — Sodium Bicarbonate IV Soln 8.4%: INTRAVENOUS | Qty: 50 | Status: AC

## 2013-03-11 MED FILL — Heparin Sodium (Porcine) Inj 1000 Unit/ML: INTRAMUSCULAR | Qty: 10 | Status: AC

## 2013-03-11 MED FILL — Lidocaine HCl IV Inj 20 MG/ML: INTRAVENOUS | Qty: 5 | Status: AC

## 2013-03-11 MED FILL — Sodium Chloride IV Soln 0.9%: INTRAVENOUS | Qty: 1000 | Status: AC

## 2013-03-11 MED FILL — Potassium Chloride Inj 2 mEq/ML: INTRAVENOUS | Qty: 40 | Status: AC

## 2013-03-11 NOTE — Progress Notes (Signed)
Notified of patient's interest in establishing cardiology follow-up in [place]. Introduced myself to her. She has now undergone CABG. Will arrange post-hospital cardiology followup in August.  Tonny Bollman 03/11/2013 5:19 PM

## 2013-03-11 NOTE — Progress Notes (Signed)
Patient continues to experience nausea while on Bi-pap despite prn zofran.  Family/patient refuses to continue Bi-pap d/t nausea and aspiration risk.  Will assess blood gas with patient on nasal cannula and continue to monitor.

## 2013-03-11 NOTE — Progress Notes (Signed)
Presently, pt remains off BiPap.  Pt is awake, alert and oriented.  No resp distress noted, pt able to speak in full, complete sentances.  Pt denies SOB.  Sats 97% on 4 lpm [place].  Dr. Cornelius Moras in room to eval pt.

## 2013-03-11 NOTE — Progress Notes (Addendum)
      [address]       Darlene Berry [address]             [phone]        CARDIOTHORACIC SURGERY PROGRESS NOTE   R1 Day Post-Op Procedure(s) (LRB): CORONARY ARTERY BYPASS GRAFTING (CABG) (N/A) INTRAOPERATIVE TRANSESOPHAGEAL ECHOCARDIOGRAM (N/A) ENDOVEIN HARVEST OF GREATER SAPHENOUS VEIN (Right)  Subjective: Feels nauseated and sore across chest.  Somewhat anxious, which is not a new problem  Objective: Vital signs: BP Readings from Last 1 Encounters:  03/11/13 133/65   Pulse Readings from Last 1 Encounters:  03/11/13 86   Resp Readings from Last 1 Encounters:  03/11/13 27   Temp Readings from Last 1 Encounters:  03/11/13 98.8 F (37.1 C)     Hemodynamics: PAP: (27-59)/(12-33) 38/21 mmHg CO:  [3.5 L/min-4.4 L/min] 4 L/min CI:  [2.1 L/min/m2-2.5 L/min/m2] 2.3 L/min/m2  Physical Exam:  Rhythm:   sinus  Breath sounds: clear  Heart sounds:  RRR  Incisions:  Dressings dry  Abdomen:  Soft, non-distended, non-tender  Extremities:  Warm, well-perfused    Intake/Output from previous day: 07/16 0701 - 07/17 0700 In: 6106.5 [I.V.:4346.5; Blood:300; NG/GT:30; IV Piggyback:1430] Out: 4665 [Urine:2875; Blood:1150; Chest Tube:640] Intake/Output this shift:    Lab Results:  Recent Labs  03/10/13 1930 03/11/13 0400  WBC 15.1* 13.2*  HGB 11.0* 10.0*  HCT 30.6* 28.1*  PLT 190 175   BMET:  Recent Labs  03/08/13 1524  03/10/13 1929 03/10/13 1930 03/11/13 0400  NA 135  < > 138  --  135  K 4.1  < > 4.3  --  3.9  CL 96  --  102  --  102  CO2 24  --   --   --  26  GLUCOSE 122*  < > 174*  --  129*  BUN 16  --  19  --  16  CREATININE 0.87  --  1.00 0.90 0.81  CALCIUM 10.2  --   --   --  8.0*  < > = values in this interval not displayed.  CBG (last 3)   Recent Labs  03/11/13 0456 03/11/13 0604 03/11/13 0659  GLUCAP 128* 114* 119*   ABG    Component Value Date/Time   PHART 7.347* 03/11/2013 0400   HCO3 24.5* 03/11/2013 0400   TCO2 26  03/11/2013 0400   ACIDBASEDEF 1.0 03/11/2013 0400   O2SAT 94.0 03/11/2013 0400   CXR: Mild bibasilar atelectasis  Assessment/Plan: S/P Procedure(s) (LRB): CORONARY ARTERY BYPASS GRAFTING (CABG) (N/A) INTRAOPERATIVE TRANSESOPHAGEAL ECHOCARDIOGRAM (N/A) ENDOVEIN HARVEST OF GREATER SAPHENOUS VEIN (Right)  Doing well POD1 Maintaining NSR w/ stable BP off all drips except NTG for HTN O2 sats 96-100% on 4 L/min Expected post op acute blood loss anemia, mild, stable Expected post op volume excess, mild Expected post op atelectasis, mild Type II diabetes mellitus, excellent glycemic control on insulin drip Anxiety   Mobilize, pulm toilet  Diuresis  D/C tubes and lines  Start levemir insulin and wean drip  Darlene Berry H 03/11/2013 8:04 AM

## 2013-03-11 NOTE — Progress Notes (Signed)
  Echocardiogram Echocardiogram Transesophageal has been performed.  Darlene Berry 03/11/2013, 11:03 AM

## 2013-03-11 NOTE — Progress Notes (Signed)
[facility] BRIEF SICU PROGRESS NOTE  1 Day Post-Op  S/P Procedure(s) (LRB): CORONARY ARTERY BYPASS GRAFTING (CABG) (N/A) INTRAOPERATIVE TRANSESOPHAGEAL ECHOCARDIOGRAM (N/A) ENDOVEIN HARVEST OF GREATER SAPHENOUS VEIN (Right)   Stable day Still nauseated and anxious NSR w/ stable BP UOP adequate Labs okay Back on insulin drip  Plan: Continue current plan.  Will add phenergan as needed  Purcell Nails 03/11/2013 6:21 PM

## 2013-03-11 NOTE — Progress Notes (Signed)
Utilization Review Completed.  

## 2013-03-12 ENCOUNTER — Inpatient Hospital Stay (HOSPITAL_COMMUNITY): Payer: Medicare Other

## 2013-03-12 ENCOUNTER — Encounter (HOSPITAL_COMMUNITY): Payer: Self-pay | Admitting: Thoracic Surgery (Cardiothoracic Vascular Surgery)

## 2013-03-12 LAB — GLUCOSE, CAPILLARY
Glucose-Capillary: 112 mg/dL — ABNORMAL HIGH (ref 70–99)
Glucose-Capillary: 113 mg/dL — ABNORMAL HIGH (ref 70–99)
Glucose-Capillary: 116 mg/dL — ABNORMAL HIGH (ref 70–99)
Glucose-Capillary: 129 mg/dL — ABNORMAL HIGH (ref 70–99)
Glucose-Capillary: 86 mg/dL (ref 70–99)
Glucose-Capillary: 92 mg/dL (ref 70–99)
Glucose-Capillary: 92 mg/dL (ref 70–99)
Glucose-Capillary: 95 mg/dL (ref 70–99)
Glucose-Capillary: 106 mg/dL — ABNORMAL HIGH (ref 70–99)
Glucose-Capillary: 117 mg/dL — ABNORMAL HIGH (ref 70–99)
Glucose-Capillary: 118 mg/dL — ABNORMAL HIGH (ref 70–99)
Glucose-Capillary: 90 mg/dL (ref 70–99)
Glucose-Capillary: 96 mg/dL (ref 70–99)
Glucose-Capillary: 92 mg/dL (ref 70–99)
Glucose-Capillary: 98 mg/dL (ref 70–99)
Glucose-Capillary: 145 mg/dL — ABNORMAL HIGH (ref 70–99)
Glucose-Capillary: 171 mg/dL — ABNORMAL HIGH (ref 70–99)
Glucose-Capillary: 187 mg/dL — ABNORMAL HIGH (ref 70–99)

## 2013-03-12 LAB — BASIC METABOLIC PANEL
BUN: 16 mg/dL (ref 6–23)
CO2: 28 mEq/L (ref 19–32)
Calcium: 8.1 mg/dL — ABNORMAL LOW (ref 8.4–10.5)
Chloride: 98 mEq/L (ref 96–112)
Creatinine, Ser: 0.91 mg/dL (ref 0.50–1.10)
GFR calc Af Amer: 77 mL/min — ABNORMAL LOW (ref >90)
GFR calc non Af Amer: 66 mL/min — ABNORMAL LOW (ref >90)
Glucose, Bld: 101 mg/dL — ABNORMAL HIGH (ref 70–99)
Potassium: 3.7 mEq/L (ref 3.5–5.1)
Sodium: 134 mEq/L — ABNORMAL LOW (ref 135–145)

## 2013-03-12 LAB — CBC
HCT: 26.9 % — ABNORMAL LOW (ref 36.0–46.0)
Hemoglobin: 9.4 g/dL — ABNORMAL LOW (ref 12.0–15.0)
MCH: 30 pg (ref 26.0–34.0)
MCHC: 34.9 g/dL (ref 30.0–36.0)
MCV: 85.9 fL (ref 78.0–100.0)
Platelets: 135 10*3/uL — ABNORMAL LOW (ref 150–400)
RBC: 3.13 MIL/uL — ABNORMAL LOW (ref 3.87–5.11)
RDW: 12.9 % (ref 11.5–15.5)
WBC: 11.2 10*3/uL — ABNORMAL HIGH (ref 4.0–10.5)

## 2013-03-12 MED ORDER — HYDROMORPHONE HCL PF 1 MG/ML IJ SOLN
INTRAMUSCULAR | Status: AC
  Filled 2013-03-12: qty 1

## 2013-03-12 MED ORDER — POTASSIUM CHLORIDE 10 MEQ/50ML IV SOLN
INTRAVENOUS | Status: AC
  Filled 2013-03-12: qty 150

## 2013-03-12 MED ORDER — SODIUM CHLORIDE 0.9 % IJ SOLN
3.0000 mL | Freq: Two times a day (BID) | INTRAMUSCULAR | Status: DC
  Administered 2013-03-12 (×2): 3 mL via INTRAVENOUS

## 2013-03-12 MED ORDER — MOVING RIGHT ALONG BOOK
Freq: Once | Status: AC
  Administered 2013-03-12: 09:00:00
  Filled 2013-03-12: qty 1

## 2013-03-12 MED ORDER — POTASSIUM CHLORIDE 10 MEQ/50ML IV SOLN
10.0000 meq | INTRAVENOUS | Status: AC
  Administered 2013-03-12 (×3): 10 meq via INTRAVENOUS

## 2013-03-12 MED ORDER — MAGNESIUM HYDROXIDE 400 MG/5ML PO SUSP: 30.0000 mL | Freq: Every day | ORAL | Status: DC | PRN

## 2013-03-12 MED ORDER — FUROSEMIDE 40 MG PO TABS
40.0000 mg | ORAL_TABLET | Freq: Every day | ORAL | Status: AC
  Administered 2013-03-12 – 2013-03-14 (×3): 40 mg via ORAL
  Filled 2013-03-12 (×3): qty 1

## 2013-03-12 MED ORDER — OXYCODONE HCL 5 MG PO TABS
ORAL_TABLET | ORAL | Status: AC
  Filled 2013-03-12: qty 1

## 2013-03-12 MED ORDER — SODIUM CHLORIDE 0.9 % IV SOLN: 250.0000 mL | INTRAVENOUS | Status: DC | PRN

## 2013-03-12 MED ORDER — INSULIN DETEMIR 100 UNIT/ML ~~LOC~~ SOLN
36.0000 [IU] | Freq: Two times a day (BID) | SUBCUTANEOUS | Status: DC
  Administered 2013-03-12 (×2): 36 [IU] via SUBCUTANEOUS
  Filled 2013-03-12 (×4): qty 0.36

## 2013-03-12 MED ORDER — POTASSIUM CHLORIDE CRYS ER 20 MEQ PO TBCR
20.0000 meq | EXTENDED_RELEASE_TABLET | Freq: Every day | ORAL | Status: AC
  Administered 2013-03-13 – 2013-03-14 (×2): 20 meq via ORAL
  Filled 2013-03-12 (×3): qty 1

## 2013-03-12 MED ORDER — INSULIN ASPART 100 UNIT/ML ~~LOC~~ SOLN
0.0000 [IU] | Freq: Three times a day (TID) | SUBCUTANEOUS | Status: DC
  Administered 2013-03-12: 2 [IU] via SUBCUTANEOUS
  Administered 2013-03-12: 4 [IU] via SUBCUTANEOUS
  Administered 2013-03-13: 2 [IU] via SUBCUTANEOUS
  Administered 2013-03-13: 8 [IU] via SUBCUTANEOUS
  Administered 2013-03-13 (×2): 4 [IU] via SUBCUTANEOUS
  Administered 2013-03-14: 2 [IU] via SUBCUTANEOUS
  Administered 2013-03-14: 16 [IU] via SUBCUTANEOUS
  Administered 2013-03-14: 8 [IU] via SUBCUTANEOUS
  Administered 2013-03-14: 12 [IU] via SUBCUTANEOUS
  Administered 2013-03-15: 4 [IU] via SUBCUTANEOUS

## 2013-03-12 MED ORDER — SODIUM CHLORIDE 0.9 % IJ SOLN
3.0000 mL | INTRAMUSCULAR | Status: DC | PRN
  Administered 2013-03-13: 3 mL via INTRAVENOUS

## 2013-03-12 NOTE — Progress Notes (Addendum)
      [address]       [name] [address]             [phone]        CARDIOTHORACIC SURGERY PROGRESS NOTE   R2 Days Post-Op Procedure(s) (LRB): CORONARY ARTERY BYPASS GRAFTING (CABG) (N/A) INTRAOPERATIVE TRANSESOPHAGEAL ECHOCARDIOGRAM (N/A) ENDOVEIN HARVEST OF GREATER SAPHENOUS VEIN (Right)  Subjective: Feels much better.  Slept well.  Nausea improved.  Mild soreness in chest.  Objective: Vital signs: BP Readings from Last 1 Encounters:  03/12/13 105/50   Pulse Readings from Last 1 Encounters:  03/12/13 79   Resp Readings from Last 1 Encounters:  03/12/13 18   Temp Readings from Last 1 Encounters:  03/12/13 98.6 F (37 C) Oral    Hemodynamics:    Physical Exam:  Rhythm:   sinus  Breath sounds: Few bibasilar crackles  Heart sounds:  RRR  Incisions:  Dressing dry, intact  Abdomen:  Soft, non-distended, non-tender  Extremities:  Warm, well-perfused    Intake/Output from previous day: 07/17 0701 - 07/18 0700 In: 1032 [P.O.:100; I.V.:532; IV Piggyback:400] Out: 2340 [Urine:2230; Chest Tube:110] Intake/Output this shift:    Lab Results:  Recent Labs  03/11/13 1600 03/11/13 1646 03/12/13 0410  WBC 13.5*  --  11.2*  HGB 9.9* 9.9* 9.4*  HCT 28.4* 29.0* 26.9*  PLT 147*  --  135*   BMET:  Recent Labs  03/11/13 0400  03/11/13 1646 03/12/13 0410  NA 135  --  134* 134*  K 3.9  --  4.0 3.7  CL 102  --  100 98  CO2 26  --   --  28  GLUCOSE 129*  --  186* 101*  BUN 16  --  17 16  CREATININE 0.81  < > 1.00 0.91  CALCIUM 8.0*  --   --  8.1*  < > = values in this interval not displayed.  CBG (last 3)   Recent Labs  03/12/13 0406 03/12/13 0502 03/12/13 0551  GLUCAP 106* 117* 118*   ABG    Component Value Date/Time   PHART 7.347* 03/11/2013 0400   HCO3 24.5* 03/11/2013 0400   TCO2 21 03/11/2013 1646   ACIDBASEDEF 1.0 03/11/2013 0400   O2SAT 94.0 03/11/2013 0400   CXR: Looks good.  Mild bibasilar  atelectasis  Assessment/Plan: S/P Procedure(s) (LRB): CORONARY ARTERY BYPASS GRAFTING (CABG) (N/A) INTRAOPERATIVE TRANSESOPHAGEAL ECHOCARDIOGRAM (N/A) ENDOVEIN HARVEST OF GREATER SAPHENOUS VEIN (Right)  Doing well POD2 Expected post op acute blood loss anemia, mild, stable Expected post op atelectasis, mild Expected post op volume excess, mild, diuresing some Type II diabetes mellitus, excellent glycemic control but back on insulin drip Anxiety improved at present   Increase levemir insulin  Restart oral agents once po intake improves  Mobilize  Pulm toilet  Diuresis  Transfer 2000  Restart ACE-I in 1-2 days if BP increases   OWEN,CLARENCE H 03/12/2013 8:11 AM

## 2013-03-12 NOTE — Progress Notes (Signed)
1610- report called to receiving RN on 2000, all questions answered  1045- transferred pt to 2010, RN notified of pt's arrival, pt in recliner, placed on telemetry, safety maintained

## 2013-03-13 ENCOUNTER — Inpatient Hospital Stay (HOSPITAL_COMMUNITY): Payer: Medicare Other

## 2013-03-13 LAB — GLUCOSE, CAPILLARY
Glucose-Capillary: 133 mg/dL — ABNORMAL HIGH (ref 70–99)
Glucose-Capillary: 191 mg/dL — ABNORMAL HIGH (ref 70–99)
Glucose-Capillary: 237 mg/dL — ABNORMAL HIGH (ref 70–99)
Glucose-Capillary: 172 mg/dL — ABNORMAL HIGH (ref 70–99)

## 2013-03-13 LAB — CBC
HCT: 26.6 % — ABNORMAL LOW (ref 36.0–46.0)
Hemoglobin: 8.9 g/dL — ABNORMAL LOW (ref 12.0–15.0)
MCH: 29.2 pg (ref 26.0–34.0)
MCHC: 33.5 g/dL (ref 30.0–36.0)
MCV: 87.2 fL (ref 78.0–100.0)
Platelets: 143 10*3/uL — ABNORMAL LOW (ref 150–400)
RBC: 3.05 MIL/uL — ABNORMAL LOW (ref 3.87–5.11)
RDW: 12.7 % (ref 11.5–15.5)
WBC: 9.2 10*3/uL (ref 4.0–10.5)

## 2013-03-13 LAB — BASIC METABOLIC PANEL
BUN: 15 mg/dL (ref 6–23)
CO2: 32 mEq/L (ref 19–32)
Calcium: 9 mg/dL (ref 8.4–10.5)
Chloride: 99 mEq/L (ref 96–112)
Creatinine, Ser: 0.92 mg/dL (ref 0.50–1.10)
GFR calc Af Amer: 76 mL/min — ABNORMAL LOW (ref >90)
GFR calc non Af Amer: 65 mL/min — ABNORMAL LOW (ref >90)
Glucose, Bld: 104 mg/dL — ABNORMAL HIGH (ref 70–99)
Potassium: 3.8 mEq/L (ref 3.5–5.1)
Sodium: 138 mEq/L (ref 135–145)

## 2013-03-13 MED ORDER — INSULIN DETEMIR 100 UNIT/ML ~~LOC~~ SOLN
10.0000 [IU] | Freq: Every day | SUBCUTANEOUS | Status: DC
  Administered 2013-03-13 – 2013-03-14 (×2): 10 [IU] via SUBCUTANEOUS
  Filled 2013-03-13 (×3): qty 0.1

## 2013-03-13 MED ORDER — METFORMIN HCL 500 MG PO TABS
1000.0000 mg | ORAL_TABLET | Freq: Two times a day (BID) | ORAL | Status: DC
  Administered 2013-03-13 – 2013-03-15 (×4): 1000 mg via ORAL
  Filled 2013-03-13 (×6): qty 2

## 2013-03-13 MED ORDER — POTASSIUM CHLORIDE CRYS ER 20 MEQ PO TBCR
20.0000 meq | EXTENDED_RELEASE_TABLET | Freq: Once | ORAL | Status: AC
  Administered 2013-03-13: 20 meq via ORAL
  Filled 2013-03-13: qty 1

## 2013-03-13 MED ORDER — LISINOPRIL 5 MG PO TABS
5.0000 mg | ORAL_TABLET | Freq: Every day | ORAL | Status: DC
  Administered 2013-03-13: 5 mg via ORAL
  Filled 2013-03-13 (×2): qty 1

## 2013-03-13 MED ORDER — FERROUS SULFATE 325 (65 FE) MG PO TABS
325.0000 mg | ORAL_TABLET | Freq: Every day | ORAL | Status: DC
  Administered 2013-03-13 – 2013-03-15 (×3): 325 mg via ORAL
  Filled 2013-03-13 (×4): qty 1

## 2013-03-13 NOTE — Discharge Instructions (Signed)
Activity: 1.May walk up steps                2.No lifting more than ten pounds for four weeks.                 3.No driving for four weeks.                4.Stop any activity that causes chest pain, shortness of breath, dizziness,  sweating or excessive weakness.                5.Avoid straining.                6.Continue with your breathing exercises daily.  Diet: Diabetic diet and Low fat, Low saltl diet  Wound Care: May shower.  Clean wounds with mild soap and water daily. Contact the office at [phone] if any problems arise.  Coronary Artery Bypass Grafting Care After Refer to this sheet in the next few weeks. These instructions provide you with information on caring for yourself after your procedure. Your caregiver may also give you more specific instructions. Your treatment has been planned according to current medical practices, but problems sometimes occur. Call your caregiver if you have any problems or questions after your procedure.  Recovery from open heart surgery will be different for everyone. Some people feel well after 3 or 4 weeks, while for others it takes longer. After heart surgery, it may be normal to:  Not have an appetite, feel nauseated by the smell of food, or only want to eat a small amount.  Be constipated because of changes in your diet, activity, and medicines. Eat foods high in fiber. Add fresh fruits and vegetables to your diet. Stool softeners may be helpful.  Feel sad or unhappy. You may be frustrated or cranky. You may have good days and bad days. Do not give up. Talk to your caregiver if you do not feel better.  Feel weakness and fatigue. You many need physical therapy or cardiac rehabilitation to get your strength back.  Develop an irregular heartbeat called atrial fibrillation. Symptoms of atrial fibrillation are a fast, irregular heartbeat or feelings of fluttery heartbeats, shortness of breath, low blood pressure, and dizziness. If these symptoms  develop, see your caregiver right away. MEDICATION  Have a list of all the medicines you will be taking when you leave the hospital. For every medicine, know the following:  Name.  Exact dose.  Time of day to be taken.  How often it should be taken.  Why you are taking it.  Ask which medicines should or should not be taken together. If you take more than one heart medicine, ask if it is okay to take them together. Some heart medicines should not be taken at the same time because they may lower your blood pressure too much.  Narcotic pain medicine can cause constipation. Eat fresh fruits and vegetables. Add fiber to your diet. Stool softener medicine may help relieve constipation.  Keep a copy of your medicines with you at all times.  Do not add or stop taking any medicine until you check with your caregiver.  Medicines can have side effects. Call your caregiver who prescribed the medicine if you:  Start throwing up, have diarrhea, or have stomach pain.  Feel dizzy or lightheaded when you stand up.  Feel your heart is skipping beats or is beating too fast or too slow.  Develop a rash.  Notice unusual bruising or bleeding. HOME  CARE INSTRUCTIONS  After heart surgery, it is important to learn how to take your pulse. Have your caregiver show you how to take your pulse.  Use your incentive spirometer. Ask your caregiver how long after surgery you need to use it. Care of your chest incision  Tell your caregiver right away if you notice clicking in your chest (sternum).  Support your chest with a pillow or your arms when you take deep breaths and cough.  Follow your caregiver's instructions about when you can bathe or swim.  Protect your incision from sunlight during the first year to keep the scar from getting dark.  Tell your caregiver if you notice:  Increased tenderness of your incision.  Increased redness or swelling around your incision.  Drainage or pus from  your incision. Care of your leg incision(s)  Avoid crossing your legs.  Avoid sitting for long periods of time. Change positions every half hour.  Elevate your leg(s) when you are sitting.  Check your leg(s) daily for swelling. Check the incisions for redness or drainage.  Wear your elastic stockings as told by your caregiver. Take them off at bedtime. Diet  Diet is very important to heart health.  Eat plenty of fresh fruits and vegetables. Meats should be lean cut. Avoid canned, processed, and fried foods.  Talk to a dietician. They can teach you how to make healthy food and drink choices. Weight  Weigh yourself every day. This is important because it helps to know if you are retaining fluid that may make your heart and lungs work harder.  Use the same scale each time.  Weigh yourself every morning at the same time. You should do this after you go to the bathroom, but before you eat breakfast.  Your weight will be more accurate if you do not wear any clothes.  Record your weight.  Tell your caregiver if you have gained 2 pounds or more overnight. Activity Stop any activity at once if you have chest pain, shortness of breath, irregular heartbeats, or dizziness. Get help right away if you have any of these symptoms.  Bathing.  Avoid soaking in a bath or hot tub until your incisions are healed.  Rest. You need a balance of rest and activity.  Exercise. Exercise per your caregiver's advice. You may need physical therapy or cardiac rehabilitation to help strengthen your muscles and build your endurance.  Climbing stairs. Unless your caregiver tells you not to climb stairs, go up stairs slowly and rest if you tire. Do not pull yourself up by the handrail.  Driving a car. Follow your caregiver's advice on when you may drive. You may ride as a passenger at any time. When traveling for long periods of time in a car, get out of the car and walk around for a few minutes every 2  hours.  Lifting. Avoid lifting, pushing, or pulling anything heavier than 10 pounds for 6 weeks after surgery or as told by your caregiver.  Returning to work. Check with your caregiver. People heal at different rates. Most people will be able to go back to work 6 to 12 weeks after surgery.  Sexual activity. You may resume sexual relations as told by your caregiver. SEEK MEDICAL CARE IF:  Any of your incisions are red, painful, or have any type of drainage coming from them.  You have an oral temperature above 102 F (38.9 C).  You have ankle or leg swelling.  You have pain in your legs.  You have weight gain of 2 or more pounds a day.  You feel dizzy or lightheaded when you stand up. SEEK IMMEDIATE MEDICAL CARE IF:  You have angina or chest pain that goes to your jaw or arms. Call your local emergency services right away.  You have shortness of breath at rest or with activity.  You have a fast or irregular heartbeat (arrhythmia).  There is a "clicking" in your sternum when you move.  You have numbness or weakness in your arms or legs. MAKE SURE YOU:  Understand these instructions.  Will watch your condition.  Will get help right away if you are not doing well or get worse. Document Released: 03/01/2005 Document Revised: 11/04/2011 Document Reviewed: 10/17/2010 [facility] Patient Information 2014 [place], [place].

## 2013-03-13 NOTE — Progress Notes (Addendum)
      [address]       [place] [address]             [phone]        3 Days Post-Op Procedure(s) (LRB): CORONARY ARTERY BYPASS GRAFTING (CABG) (N/A) INTRAOPERATIVE TRANSESOPHAGEAL ECHOCARDIOGRAM (N/A) ENDOVEIN HARVEST OF GREATER SAPHENOUS VEIN (Right)  Subjective: Patient has minor incisional pain. No other complaints.  Objective: Vital signs in last 24 hours: Temp:  [98.1 F (36.7 C)-98.6 F (37 C)] 98.1 F (36.7 C) (07/19 0459) Pulse Rate:  [77-84] 79 (07/19 0459) Cardiac Rhythm:  [-] Normal sinus rhythm (07/18 1500) Resp:  [18-27] 18 (07/19 0459) BP: (113-161)/(44-67) 135/63 mmHg (07/19 0459) SpO2:  [91 %-97 %] 95 % (07/19 0459) Weight:  [74.345 kg (163 lb 14.4 oz)] 74.345 kg (163 lb 14.4 oz) (07/19 0459)  Pre op weight 71 kg Current Weight  03/13/13 74.345 kg (163 lb 14.4 oz)      Intake/Output from previous day: 07/18 0701 - 07/19 0700 In: 291.9 [P.O.:240; I.V.:1.9; IV Piggyback:50] Out: 350 [Urine:350]   Physical Exam:  Cardiovascular: RRR, no murmurs, gallops, or rubs. Pulmonary: Slightly diminished at bases; no rales, wheezes, or rhonchi. Abdomen: Soft, non tender, bowel sounds present. Extremities: Mild bilateral lower extremity edema. Wounds: Clean and dry.  No erythema or signs of infection.  Lab Results: CBC: Recent Labs  03/12/13 0410 03/13/13 0500  WBC 11.2* 9.2  HGB 9.4* 8.9*  HCT 26.9* 26.6*  PLT 135* 143*   BMET:  Recent Labs  03/12/13 0410 03/13/13 0500  NA 134* 138  K 3.7 3.8  CL 98 99  CO2 28 32  GLUCOSE 101* 104*  BUN 16 15  CREATININE 0.91 0.92  CALCIUM 8.1* 9.0    PT/INR:  Lab Results  Component Value Date   INR 1.39 03/10/2013   INR 0.99 03/08/2013   ABG:  INR: Will add last result for INR, ABG once components are confirmed Will add last 4 CBG results once components are confirmed  Assessment/Plan:  1. CV - SR. On Atenolol 25 bid. Will restart low dose Lisinopril for better BP  control. 2.  Pulmonary - CXR this am shows no pneumothorax, low lung volumes, bibasilar atelectasis and small effusions.  Encourage incentive spirometer. 3. Volume Overload - Continue Lasix 40 daily 4.  Acute blood loss anemia - H and H 8.9 and 26.6. Will start Ferrous. 5.Supplement potassium 6.DM- CBGs 171/187/133. Pre op HGA1C 6.6. On Insulin. Will restart Metformin and decrease Insulin. Will restart Glipizide in am. 7.Continue CRPI 8.Remove EPW in am  ZIMMERMAN,DONIELLE MPA-C 03/13/2013,8:41 AM  Patient seen and examined. Agree with above.

## 2013-03-13 NOTE — Discharge Summary (Signed)
Physician Discharge Summary       [address] [place]       [name] [address]             [phone]    Patient ID: Darlene Berry MRN: [id number] DOB/AGE: 02-Jun-1951 62 y.o.  Admit date: 03/10/2013 Discharge date: 03/15/2013  Admission Diagnoses: 1. CAD 2.History of DM 3.History of hyperlipidemia 4.History of hypertension 5.History of carotid artery stenosis 6.History of tobacco abuse 7.History of endometriosis  Discharge Diagnoses:  1. CAD 2.History of DM 3.History of hyperlipidemia 4.History of hypertension 5.History of carotid artery stenosis 6.History of tobacco abuse 7.History of endometriosis 8.ABL anemia 9.Mild thrombocytopenia  Procedure (s):  Coronary Artery Bypass Grafting x 2  Left Internal Mammary Artery to Distal Left Anterior Descending Coronary Artery  Saphenous Vein Graft to Distal Right Coronary Artery  Endoscopic Vein Harvest from Right Thigh by Dr. 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 The patient has undergone cardiac catheterization twice previously in the distant past for occasional symptoms of chest pain. Over the past several months the patient has developed increasing episodes of substernal chest tightness which typically occurs with physical activity and relieved by rest. 3 weeks ago she was working in her garden and doing some fairly strenuous digging when she developed a particular severe episode of substernal chest tightness which radiated to the jaw and was associated with severe shortness of breath. She stopped and rested and laid down, and the symptoms gradually went away. She was seen by her primary care physician and referred to [facility] in [place] where she underwent  diagnostic cardiac catheterization. She was found to have three-vessel coronary artery disease with normal left ventricular systolic function. It was recommended that she undergo surgical revascularization and she was seen in consultation by one of the cardiothoracic surgeons. However, the patient insisted on referral to [facility] for definitive management. Because the patient's symptoms remained stable and her coronary anatomy was not felt to be critical, she was discharged from the hospital and referred for elective cardiothoracic surgical consultation.  The patient states that she has been having mild symptoms of tightness across her chest and shortness of breath with exertion off and on for several months. She had the one particular a severe episode of chest discomfort as described above when she was performing strenuous activity several weeks ago. Since her hospital discharge recently she had 1 brief episode of chest discomfort which resolved promptly with administration of sublingual nitroglycerin. She has stable symptoms of exertional shortness of breath. She denies resting shortness of breath, PND, orthopnea, or lower extremity edema. She has not had palpitations nor syncope.   Brief Hospital Course:  The patient was extubated the evening of surgery without difficulty. He/she remained afebrile and hemodynamically stable. Theone Murdoch, a line, chest tubes, and foley were removed early in the post operative course. Lopressor was started and titrated accordingly. She was volume over loaded and diuresed. She was weaned off the insulin drip. Once she was tolerating a diet, home diabetic medicines were restarted as well as low dose insulin. The patient's HGA1C pre op was  6.6. The patient was felt surgically stable for transfer from the ICU to [facility] for further convalescence on 03/12/2013. She continues to progress with cardiac rehab. She  was ambulating on room air. She has been tolerating a  diet and has had a bowel movement. Epicardial pacing wires and chest tube sutures will be removed prior to discharge. Provided the patient remains afebrile, hemodynamically stable, and pending morning round evaluation, she will be surgically stable for discharge on 03/15/2013.   Latest Vital Signs: Blood pressure 108/65, pulse 79, temperature 98.5 F (36.9 C), temperature source Oral, resp. rate 18, height 5\' 1"  (1.549 m), weight 72.893 kg (160 lb 11.2 oz), SpO2 92.00%.  Physical Exam: Cardiovascular: RRR, no murmurs, gallops, or rubs.  Pulmonary: Slightly diminished at bases; no rales, wheezes, or rhonchi.  Abdomen: Soft, non tender, bowel sounds present.  Extremities: Mild bilateral lower extremity edema.  Wounds: Clean and dry. No erythema or signs of infection.   Discharge Condition:Stable  Recent laboratory studies:  Lab Results  Component Value Date   WBC 9.2 03/13/2013   HGB 8.9* 03/13/2013   HCT 26.6* 03/13/2013   MCV 87.2 03/13/2013   PLT 143* 03/13/2013   Lab Results  Component Value Date   NA 138 03/13/2013   K 3.8 03/13/2013   CL 99 03/13/2013   CO2 32 03/13/2013   CREATININE 0.92 03/13/2013   GLUCOSE 104* 03/13/2013      Diagnostic Studies: Dg Chest 2 View  03/13/2013   *RADIOLOGY REPORT*  Clinical Data: Follow up atelectasis  CHEST - 2 VIEW  Comparison: Hypertension, prior chest x-ray 03/12/2013  Findings: Improved inspiratory volumes with decreasing bibasilar effusions and improving atelectasis.  The right IJ vascular sheath has been removed.  Stable cardiac and mediastinal contours.  Status post median sternotomy with evidence of multivessel CABG including LIMA bypass.  Epicardial pacing leads remain in place.  Surgical clips noted in the soft tissues of the left neck.  No pneumothorax. No acute osseous abnormality.  IMPRESSION: Improving inspiratory volumes with decreasing bilateral effusions and bibasilar atelectasis.   Original Report Authenticated By: Malachy Moan,  M.D.    Discharge Medications:    Medication List    STOP taking these medications       nitroGLYCERIN 0.4 MG SL tablet  Commonly known as:  NITROSTAT      TAKE these medications       ALPRAZolam 0.5 MG tablet  Commonly known as:  XANAX  Take 0.5 mg by mouth at bedtime as needed for sleep.     aspirin 325 MG tablet  Take 325 mg by mouth daily.     atenolol 25 MG tablet  Commonly known as:  TENORMIN  Take 25 mg by mouth 2 (two) times daily.     buPROPion 150 MG 12 hr tablet  Commonly known as:  WELLBUTRIN SR  Take 150 mg by mouth 2 (two) times daily.     fenofibrate 160 MG tablet  Take 160 mg by mouth at bedtime.     ferrous sulfate 325 (65 FE) MG tablet  Take 1 tablet (325 mg total) by mouth daily with breakfast.     glipiZIDE 10 MG tablet  Commonly known as:  GLUCOTROL  Take 10 mg by mouth 2 (two) times daily before a meal.     insulin aspart 100 UNIT/ML injection  Commonly known as:  novoLOG  Inject 0-8 Units into the skin daily as needed for high blood sugar.     insulin glargine 100 UNIT/ML injection  Commonly known as:  LANTUS  Inject 70 Units into the skin at bedtime.     lisinopril 2.5 MG tablet  Commonly  known as:  PRINIVIL,ZESTRIL  Take 1 tablet (2.5 mg total) by mouth daily.     magnesium oxide 400 MG tablet  Commonly known as:  MAG-OX  Take 400 mg by mouth daily.     metformin 1000 MG (OSM) 24 hr tablet  Commonly known as:  FORTAMET  Take 1,000 mg by mouth 2 (two) times daily.     nitrofurantoin 50 MG capsule  Commonly known as:  MACRODANTIN  Take 50 mg by mouth at bedtime.     omeprazole 40 MG capsule  Commonly known as:  PRILOSEC  Take 40 mg by mouth daily with breakfast.     oxyCODONE 5 MG immediate release tablet  Commonly known as:  Oxy IR/ROXICODONE  Take 1-2 tablets (5-10 mg total) by mouth every 3 (three) hours as needed.     Potassium Gluconate 595 MG Caps  Take 595 mg by mouth daily.     pravastatin 40 MG tablet    Commonly known as:  PRAVACHOL  Take 40 mg by mouth at bedtime.     PRENATAL VITAMINS PO  Take by mouth daily.     zolpidem 5 MG tablet  Commonly known as:  AMBIEN  Take 5 mg by mouth at bedtime as needed for sleep.          The patient has been discharged on:   1.Beta Blocker:  Yes [x   ]                              No   [   ]                              If No, reason:  2.Ace Inhibitor/ARB: Yes [  x ]                                     No  [    ]                                     If No, reason:  3.Statin:   Yes [ x  ]                  No  [   ]                  If No, reason:  4.Ecasa:  Yes  [ x  ]                  No   [   ]                  If No, reason:  Follow Up Appointments: Follow-up Information   Follow up with Purcell Nails, MD. (PA/LAT CXR to be taken (at [facility] which is in the same building as Dr. Orvan July office) 1 hour prior to office appointment;Office will mail you an appointment date and time)    Contact information:   [address] [employer] [address] [place] [address] [phone]       Follow up with Verneita Griffes, MD. (Call for a follow up appointment for 2 weeks)    Contact information:   [address]  [place] [address] [phone]       Signed: Doree Fudge MPA-C 03/14/2013, 10:48 AM

## 2013-03-14 LAB — GLUCOSE, CAPILLARY
Glucose-Capillary: 145 mg/dL — ABNORMAL HIGH (ref 70–99)
Glucose-Capillary: 255 mg/dL — ABNORMAL HIGH (ref 70–99)
Glucose-Capillary: 225 mg/dL — ABNORMAL HIGH (ref 70–99)
Glucose-Capillary: 325 mg/dL — ABNORMAL HIGH (ref 70–99)

## 2013-03-14 MED ORDER — FENOFIBRATE 160 MG PO TABS
160.0000 mg | ORAL_TABLET | Freq: Every day | ORAL | Status: DC
  Administered 2013-03-14 – 2013-03-15 (×2): 160 mg via ORAL
  Filled 2013-03-14 (×2): qty 1

## 2013-03-14 MED ORDER — GLIPIZIDE 10 MG PO TABS
10.0000 mg | ORAL_TABLET | Freq: Two times a day (BID) | ORAL | Status: DC
  Administered 2013-03-14 – 2013-03-15 (×2): 10 mg via ORAL
  Filled 2013-03-14 (×4): qty 1

## 2013-03-14 MED ORDER — LISINOPRIL 2.5 MG PO TABS
2.5000 mg | ORAL_TABLET | Freq: Every day | ORAL | Status: DC
  Administered 2013-03-14 – 2013-03-15 (×2): 2.5 mg via ORAL
  Filled 2013-03-14 (×2): qty 1

## 2013-03-14 NOTE — Progress Notes (Signed)
Ventricle EPW DC'd per MD order and protocol without resistance, no bleeding or ectopy noted.  Atrial wire met with resistance, secured with tape to pt and paged Donielle PA who advised to leave secured to pt until [facility] able to DC or PA will address tomorrow morning.  Pt tolerated fairly well, somewhat nervous, reassured.  Bedrest and frequents started, advised pt to remain in bed x 1 hr.  Will continue to monitor closely. Ave Filter

## 2013-03-14 NOTE — Progress Notes (Addendum)
      [address]       [place] [address]             [phone]        4 Days Post-Op Procedure(s) (LRB): CORONARY ARTERY BYPASS GRAFTING (CABG) (N/A) INTRAOPERATIVE TRANSESOPHAGEAL ECHOCARDIOGRAM (N/A) ENDOVEIN HARVEST OF GREATER SAPHENOUS VEIN (Right)  Subjective: Patient had a "coughing spell" earlier and has incisional pain. Passing a lot of flatus but no bowel movement yet.  Objective: Vital signs in last 24 hours: Temp:  [98.4 F (36.9 C)-98.5 F (36.9 C)] 98.5 F (36.9 C) (07/20 0422) Pulse Rate:  [79-87] 79 (07/20 0422) Cardiac Rhythm:  [-]  Resp:  [18] 18 (07/20 0422) BP: (108-135)/(64-78) 108/65 mmHg (07/20 0422) SpO2:  [92 %-95 %] 92 % (07/20 0422) Weight:  [72.893 kg (160 lb 11.2 oz)] 72.893 kg (160 lb 11.2 oz) (07/20 0422)  Pre op weight 71 kg Current Weight  03/14/13 72.893 kg (160 lb 11.2 oz)      Intake/Output from previous day: 07/19 0701 - 07/20 0700 In: 480 [P.O.:480] Out: -    Physical Exam:  Cardiovascular: RRR, no murmurs, gallops, or rubs. Pulmonary: Slightly diminished at bases; no rales, wheezes, or rhonchi. Abdomen: Soft, non tender, bowel sounds present. Extremities: Trace bilateral lower extremity edema. Wounds: Clean and dry.  No erythema or signs of infection.  Lab Results: CBC:  Recent Labs  03/12/13 0410 03/13/13 0500  WBC 11.2* 9.2  HGB 9.4* 8.9*  HCT 26.9* 26.6*  PLT 135* 143*   BMET:   Recent Labs  03/12/13 0410 03/13/13 0500  NA 134* 138  K 3.7 3.8  CL 98 99  CO2 28 32  GLUCOSE 101* 104*  BUN 16 15  CREATININE 0.91 0.92  CALCIUM 8.1* 9.0    PT/INR:  Lab Results  Component Value Date   INR 1.39 03/10/2013   INR 0.99 03/08/2013   ABG:  INR: Will add last result for INR, ABG once components are confirmed Will add last 4 CBG results once components are confirmed  Assessment/Plan:  1. CV - SR. On Atenolol 25 bid. And Lisinopril 2.5 daily. 2.  Pulmonary - Encourage incentive  spirometer. 3. Volume Overload - Continue Lasix 40 daily 4.  Acute blood loss anemia - H and H 8.9 and 26.6. ContinueFerrous. 5.DM- CBGs 237/172/145. Pre op HGA1C 6.6. On Insulin. Continue Metformin and Insulin. Will restart Glipizide for better glucose control. 6.Continue CRPI 7.Remove EPW today and sutures in am 8.Restart fenofibrate for hyperlipidemia 9.Possible discharge in am  ZIMMERMAN,DONIELLE MPA-C 03/14/2013,8:03 AM  Addendum- RN unable to get atrial wires out. separated wires and pulled one at a time- removed without difficulty

## 2013-03-15 LAB — GLUCOSE, CAPILLARY: Glucose-Capillary: 173 mg/dL — ABNORMAL HIGH (ref 70–99)

## 2013-03-15 MED ORDER — LISINOPRIL 2.5 MG PO TABS: 2.5000 mg | ORAL_TABLET | Freq: Every day | ORAL | Status: DC

## 2013-03-15 MED ORDER — OXYCODONE HCL 5 MG PO TABS: 5.0000 mg | ORAL_TABLET | ORAL | Status: DC | PRN

## 2013-03-15 MED ORDER — FERROUS SULFATE 325 (65 FE) MG PO TABS: 325.0000 mg | ORAL_TABLET | Freq: Every day | ORAL | Status: DC

## 2013-03-15 NOTE — Progress Notes (Addendum)
[phone] Pt has been walking independently. Education completed with pt. Understanding voiced. Discussed CRP 2. The closest hospital to pt is [facility]. Checked on web to see if they have cardiac rehab and they only have pulmonary program. Discussed with pt if she changes her mind about going to program that is some distance for her that [place] and [place] have programs. She is to discuss with cardiologist if she changes her mind. Put on discharge video for pt to view. Follows diabetic diet and HGA1C 6.6. Also discussed smoking cessation and encouraged pt to call 1800quitnow if needed. She has quit cold [place] before and plans to quit that way. Sammy Cassar RNBSN

## 2013-03-15 NOTE — Care Management Note (Signed)
    Page 1 of 1   03/15/2013     3:31:05 PM   CARE MANAGEMENT NOTE 03/15/2013  Patient:  Darlene Berry, Darlene Berry   Account Number:  [id number]  Date Initiated:  03/15/2013  Documentation initiated by:  Shahla Betsill  Subjective/Objective Assessment:   PT S/Berry CABG X 2 ON 03/10/13.  PTA, PT LIVES ALONE AND IS INDEPENDENT.     Action/Plan:   WILL DISCHARGE HOME WITH ADULT CHILDREN.  NO HOME NEEDS.   Anticipated DC Date:  03/15/2013   Anticipated DC Plan:  HOME/SELF CARE      DC Planning Services  CM consult      Choice offered to / List presented to:             Status of service:  Completed, signed off Medicare Important Message given?   (If response is "NO", the following Medicare IM given date fields will be blank) Date Medicare IM given:   Date Additional Medicare IM given:    Discharge Disposition:  HOME/SELF CARE  Per UR Regulation:  Reviewed for med. necessity/level of care/duration of stay  If discussed at Long Length of Stay Meetings, dates discussed:    Comments:

## 2013-03-15 NOTE — Progress Notes (Signed)
      [address]       [name] [address]             [phone]      5 Days Post-Op Procedure(s) (LRB): CORONARY ARTERY BYPASS GRAFTING (CABG) (N/A) INTRAOPERATIVE TRANSESOPHAGEAL ECHOCARDIOGRAM (N/A) ENDOVEIN HARVEST OF GREATER SAPHENOUS VEIN (Right)  Subjective:  Darlene Berry is without complaints this morning.  She is ambulating, + BM  Objective: Vital signs in last 24 hours: Temp:  [97.9 F (36.6 C)-98.1 F (36.7 C)] 98.1 F (36.7 C) (07/21 0546) Pulse Rate:  [73-84] 73 (07/21 0546) Cardiac Rhythm:  [-]  Resp:  [18] 18 (07/21 0546) BP: (116-179)/(59-93) 120/66 mmHg (07/21 0546) SpO2:  [96 %-98 %] 96 % (07/21 0546) Weight:  [157 lb 12.2 oz (71.56 kg)] 157 lb 12.2 oz (71.56 kg) (07/21 0546) Intake/Output from previous day: 07/20 0701 - 07/21 0700 In: 840 [P.O.:840] Out: -   General appearance: alert, cooperative and no distress Neurologic: intact Heart: regular rate and rhythm Lungs: clear to auscultation bilaterally Abdomen: soft, non-tender; bowel sounds normal; no masses,  no organomegaly Extremities: edema none appreciated Wound: clean and dry  Lab Results:  Recent Labs  03/13/13 0500  WBC 9.2  HGB 8.9*  HCT 26.6*  PLT 143*   BMET:  Recent Labs  03/13/13 0500  NA 138  K 3.8  CL 99  CO2 32  GLUCOSE 104*  BUN 15  CREATININE 0.92  CALCIUM 9.0    PT/INR: No results found for this basename: LABPROT, INR,  in the last 72 hours ABG    Component Value Date/Time   PHART 7.347* 03/11/2013 0400   HCO3 24.5* 03/11/2013 0400   TCO2 21 03/11/2013 1646   ACIDBASEDEF 1.0 03/11/2013 0400   O2SAT 94.0 03/11/2013 0400   CBG (last 3)   Recent Labs  03/14/13 1631 03/14/13 2109 03/15/13 0615  GLUCAP 225* 325* 173*    Assessment/Plan: S/P Procedure(s) (LRB): CORONARY ARTERY BYPASS GRAFTING (CABG) (N/A) INTRAOPERATIVE TRANSESOPHAGEAL ECHOCARDIOGRAM (N/A) ENDOVEIN HARVEST OF GREATER SAPHENOUS VEIN (Right)  1. CV- NSR continue  Atenolol and LIsinopril 2. Pulm- continued use of IS at discharge 3. Volume overload- will continue Lasix at discharge 4. DM- CBGs controlled, continue current regimen 5. Dispo- patient doing well will d/c home today   LOS: 5 days    Darlene Berry, Darlene Berry 03/15/2013

## 2013-03-22 ENCOUNTER — Encounter: Payer: Self-pay | Admitting: Thoracic Surgery (Cardiothoracic Vascular Surgery)

## 2013-03-26 ENCOUNTER — Other Ambulatory Visit: Payer: Self-pay | Admitting: *Deleted

## 2013-03-26 DIAGNOSIS — I2510 Atherosclerotic heart disease of native coronary artery without angina pectoris: Secondary | ICD-10-CM

## 2013-03-29 ENCOUNTER — Ambulatory Visit (INDEPENDENT_AMBULATORY_CARE_PROVIDER_SITE_OTHER): Payer: Self-pay | Admitting: Physician Assistant

## 2013-03-29 ENCOUNTER — Ambulatory Visit
Admission: RE | Admit: 2013-03-29 | Discharge: 2013-03-29 | Disposition: A | Discharge status: Home / Self Care | Payer: Medicare Other | Source: Ambulatory Visit | Attending: Thoracic Surgery (Cardiothoracic Vascular Surgery) | Admitting: Thoracic Surgery (Cardiothoracic Vascular Surgery)

## 2013-03-29 VITALS — BP 114/73 | HR 78 | Resp 16 | Ht 62.0 in | Wt 153.0 lb

## 2013-03-29 DIAGNOSIS — Z951 Presence of aortocoronary bypass graft: Secondary | ICD-10-CM

## 2013-03-29 DIAGNOSIS — I2510 Atherosclerotic heart disease of native coronary artery without angina pectoris: Secondary | ICD-10-CM

## 2013-03-29 MED ORDER — OXYCODONE HCL 5 MG PO TABS: 5.0000 mg | ORAL_TABLET | ORAL | Status: DC | PRN

## 2013-03-29 NOTE — Progress Notes (Signed)
HPI:  Patient returns for routine postoperative follow-up having undergone CABG x 2 on 03/10/2013. The patient's early postoperative recovery while in the hospital was unremarkable other than expected post operative pain.  Since hospital discharge the patient reports that she is experiencing extreme pain along her right shoulder.  She states she is unable to sleep and can't get comfortable.  Patient has chronic rotator cuff tear and states her surgery has just really aggravated this injury.  She has minor incisional pain, but is out of pain medication and is requesting a refill.  She has not tried any other pain medication for relief.  She applied heat to the affected area and actually caused blister formation.  The patient is ambulating without difficulty.  Her appetite has returned to normal.  Current Outpatient Prescriptions  Medication Sig Dispense Refill  . ALPRAZolam (XANAX) 0.5 MG tablet Take 0.5 mg by mouth at bedtime as needed for sleep.      Marland Kitchen aspirin 325 MG tablet Take 325 mg by mouth daily.      Marland Kitchen atenolol (TENORMIN) 25 MG tablet Take 25 mg by mouth 2 (two) times daily.       Marland Kitchen buPROPion (WELLBUTRIN SR) 150 MG 12 hr tablet Take 150 mg by mouth 2 (two) times daily.      . fenofibrate 160 MG tablet Take 160 mg by mouth at bedtime.       . ferrous sulfate 325 (65 FE) MG tablet Take 1 tablet (325 mg total) by mouth daily with breakfast.  30 tablet  0  . glipiZIDE (GLUCOTROL) 10 MG tablet Take 10 mg by mouth 2 (two) times daily before a meal.      . insulin aspart (NOVOLOG) 100 UNIT/ML injection Inject 0-8 Units into the skin daily as needed for high blood sugar.      . insulin glargine (LANTUS) 100 UNIT/ML injection Inject 70 Units into the skin at bedtime.      Marland Kitchen lisinopril (PRINIVIL,ZESTRIL) 2.5 MG tablet Take 1 tablet (2.5 mg total) by mouth daily.  30 tablet  3  . magnesium oxide (MAG-OX) 400 MG tablet Take 400 mg by mouth daily.      . metformin (FORTAMET) 1000 MG (OSM) 24 hr tablet  Take 1,000 mg by mouth 2 (two) times daily.      . nitrofurantoin (MACRODANTIN) 50 MG capsule Take 50 mg by mouth at bedtime.      Marland Kitchen omeprazole (PRILOSEC) 40 MG capsule Take 40 mg by mouth daily with breakfast.      . oxyCODONE (OXY IR/ROXICODONE) 5 MG immediate release tablet Take 1-2 tablets (5-10 mg total) by mouth every 4 (four) hours as needed.  50 tablet  0  . Potassium Gluconate 595 MG CAPS Take 595 mg by mouth daily.      . pravastatin (PRAVACHOL) 40 MG tablet Take 40 mg by mouth at bedtime.       Marland Kitchen PRENATAL VITAMINS PO Take by mouth daily.      Marland Kitchen zolpidem (AMBIEN) 5 MG tablet Take 5 mg by mouth at bedtime as needed for sleep.       No current facility-administered medications for this visit.    Physical Exam:  BP 114/73  Pulse 78  Resp 16  Ht 5\' 2"  (1.575 m)  Wt 153 lb (69.4 kg)  BMI 27.98 kg/m2  SpO2 99%  Gen: no apparent distress Heart: RRR Lungs: CTA bilaterally Incisions: healing well no evidence of infection Ext: no edema appreciated  Diagnostic  Tests:  CXR looks good, no residual pleural fluid or atelectasis  A/P:  1. S/P CABG x2- patient is doing well from surgery standpoint 2. Right shoulder pain- chronic Rotator cuff tear, explained to patient that this injury has most likely be aggravated by surgery.  However, this is not something we will continue to prescribe pain medication for.  The patient was given a refill on her Oxy IR and encouraged to use Ibuprofen and Tylenol to help achieve pain control.  She was also encouraged to use ice on her shoulder and follow up with her Orthopedic surgeon 3.Dispo- we will try current pain medication recommendations for 2 weeks.  Patient will return for follow up with Dr. Cornelius Moras in 2 weeks.

## 2013-04-05 ENCOUNTER — Ambulatory Visit: Payer: Medicare Other

## 2013-04-09 ENCOUNTER — Other Ambulatory Visit: Payer: Self-pay | Admitting: *Deleted

## 2013-04-09 DIAGNOSIS — I2510 Atherosclerotic heart disease of native coronary artery without angina pectoris: Secondary | ICD-10-CM

## 2013-04-13 ENCOUNTER — Ambulatory Visit (INDEPENDENT_AMBULATORY_CARE_PROVIDER_SITE_OTHER): Payer: Self-pay | Admitting: Thoracic Surgery (Cardiothoracic Vascular Surgery)

## 2013-04-13 ENCOUNTER — Encounter: Payer: Self-pay | Admitting: Thoracic Surgery (Cardiothoracic Vascular Surgery)

## 2013-04-13 VITALS — BP 156/80 | HR 79 | Resp 18 | Ht 62.0 in | Wt 158.0 lb

## 2013-04-13 DIAGNOSIS — Z951 Presence of aortocoronary bypass graft: Secondary | ICD-10-CM

## 2013-04-13 DIAGNOSIS — I2510 Atherosclerotic heart disease of native coronary artery without angina pectoris: Secondary | ICD-10-CM

## 2013-04-13 MED ORDER — TRAMADOL HCL 50 MG PO TABS: 50.0000 mg | ORAL_TABLET | Freq: Four times a day (QID) | ORAL | Status: DC | PRN

## 2013-04-13 NOTE — Progress Notes (Signed)
[address]       [place] [address]             [phone]     CARDIOTHORACIC SURGERY OFFICE NOTE  Referring Provider is Dara Hoyer, MD PCP is Verneita Griffes, MD   HPI:  Patient returns for routine followup status post coronary artery bypass grafting x2 on 03/10/2013. Her postoperative recovery has been uncomplicated. She was last seen here in our office on 03/29/2013. Since then she has continued to make steady improvement. She still has soreness in her chest which bothers her particularly at night when she tries to sleep. She is unable to lay flat in bed. However, this is gradually improving. She is walking a fair amount and she states that she still gets tired easily and feels tired and sore at the end of the day. However, her exercise tolerance has been gradually improving and overall she is pleased with her progress. She is not smoking and she reports that her blood sugars have remained under good control. Overall she is happy with how things are going. She has not been contacted about participating in cardiac rehabilitation program, although she hopes to do this in [place] as it is the closest program to where she currently lives.  In the future she plans to followup with Dr. Excell Seltzer for long-term management of her coronary artery disease, and she is scheduled to see him sometime next week.   Current Outpatient Prescriptions  Medication Sig Dispense Refill  . ALPRAZolam (XANAX) 0.5 MG tablet Take 0.5 mg by mouth at bedtime as needed for sleep.      Marland Kitchen aspirin 325 MG tablet Take 325 mg by mouth daily.      Marland Kitchen atenolol (TENORMIN) 25 MG tablet Take 25 mg by mouth 2 (two) times daily.       Marland Kitchen buPROPion (WELLBUTRIN SR) 150 MG 12 hr tablet Take 150 mg by mouth 2 (two) times daily.      . fenofibrate 160 MG tablet Take 160 mg by mouth at bedtime.       . ferrous sulfate 325 (65 FE) MG tablet Take 1 tablet (325 mg total) by mouth daily with breakfast.  30  tablet  0  . glipiZIDE (GLUCOTROL) 10 MG tablet Take 10 mg by mouth 2 (two) times daily before a meal.      . insulin aspart (NOVOLOG) 100 UNIT/ML injection Inject 0-8 Units into the skin daily as needed for high blood sugar.      . insulin glargine (LANTUS) 100 UNIT/ML injection Inject 70 Units into the skin at bedtime.      Marland Kitchen lisinopril (PRINIVIL,ZESTRIL) 2.5 MG tablet Take 1 tablet (2.5 mg total) by mouth daily.  30 tablet  3  . magnesium oxide (MAG-OX) 400 MG tablet Take 400 mg by mouth daily.      . metformin (FORTAMET) 1000 MG (OSM) 24 hr tablet Take 1,000 mg by mouth 2 (two) times daily.      . nitrofurantoin (MACRODANTIN) 50 MG capsule Take 50 mg by mouth at bedtime.      Marland Kitchen omeprazole (PRILOSEC) 40 MG capsule Take 40 mg by mouth daily with breakfast.      . oxyCODONE (OXY IR/ROXICODONE) 5 MG immediate release tablet Take 1-2 tablets (5-10 mg total) by mouth every 4 (four) hours as needed.  50 tablet  0  . Potassium Gluconate 595 MG CAPS Take 595 mg by mouth daily.      Marland Kitchen  pravastatin (PRAVACHOL) 40 MG tablet Take 40 mg by mouth at bedtime.       Marland Kitchen PRENATAL VITAMINS PO Take by mouth daily.      Marland Kitchen zolpidem (AMBIEN) 5 MG tablet Take 5 mg by mouth at bedtime as needed for sleep.      . traMADol (ULTRAM) 50 MG tablet Take 1 tablet (50 mg total) by mouth every 6 (six) hours as needed for pain.  100 tablet  0   No current facility-administered medications for this visit.      Physical Exam:   BP 156/80  Pulse 79  Resp 18  Ht 5\' 2"  (1.575 m)  Wt 158 lb (71.668 kg)  BMI 28.89 kg/m2  SpO2 98%  General:  Well-appearing  Chest:   Clear to auscultation with symmetrical breath sounds  CV:   Regular rate and rhythm without murmur  Incisions:  Clean and dry and healing nicely, sternum is stable  Abdomen:  Soft and nontender  Extremities:  Warm and well-perfused  Diagnostic Tests:  n/a   Impression:  Patient is progressing well following coronary artery bypass grafting x2 on  03/10/2013.  Plan:  I've encouraged patient to continue to increase her physical activity as tolerated with her primary limitation remaining that she refrain from heavy lifting or strenuous use of her arms or shoulders for least another 2 months. I've encouraged her to participate in the cardiac rehabilitation program. I've reminded her how important will be for her to keep her blood sugars under good control and was also discussed how imperative is that she cannot go back to smoking cigarettes. All of her questions been addressed. In the future she will call and return to see [place] as needed.   Salvatore Decent. Cornelius Moras, MD 04/13/2013 5:20 PM

## 2013-04-13 NOTE — Patient Instructions (Signed)
The patient should continue to avoid any heavy lifting or strenuous use of arms or shoulders for at least a total of three months from the time of surgery.  The patient is encouraged to enroll and participate in the outpatient cardiac rehab program beginning as soon as practical.  

## 2013-04-20 ENCOUNTER — Encounter: Payer: Medicare Other | Admitting: Cardiovascular Disease

## 2013-04-30 ENCOUNTER — Encounter: Payer: Medicare Other | Admitting: Cardiovascular Disease

## 2013-05-19 ENCOUNTER — Encounter: Payer: Self-pay | Admitting: Cardiovascular Disease

## 2013-05-19 ENCOUNTER — Ambulatory Visit (INDEPENDENT_AMBULATORY_CARE_PROVIDER_SITE_OTHER): Payer: Medicare Other | Admitting: Cardiovascular Disease

## 2013-05-19 VITALS — BP 168/75 | HR 77 | Ht 62.0 in | Wt 153.6 lb

## 2013-05-19 DIAGNOSIS — I2510 Atherosclerotic heart disease of native coronary artery without angina pectoris: Secondary | ICD-10-CM

## 2013-05-19 DIAGNOSIS — Z951 Presence of aortocoronary bypass graft: Secondary | ICD-10-CM

## 2013-05-19 MED ORDER — LISINOPRIL 10 MG PO TABS: 10.0000 mg | ORAL_TABLET | Freq: Every day | ORAL | Status: DC

## 2013-05-19 NOTE — Patient Instructions (Addendum)
Your physician has recommended you make the following change in your medication: INCREASE Lisinopril to 10mg  take one by mouth daily  Your physician recommends that you schedule a follow-up appointment in: 3 MONTHS with Dr Excell Seltzer

## 2013-05-19 NOTE — Progress Notes (Signed)
HPI:   62 year old woman presenting for heart attack evaluation. The patient has coronary artery disease and she underwent CABG 03/10/2013. She was treated with the LIMA to LAD and saphenous vein graft to distal RCA.  She presented with exertional angina. She described increasing episodes of substernal chest discomfort and shortness of breath. After her initial evaluation, she underwent cardiac catheterization at [facility] in [facility]. This demonstrated three-vessel coronary artery disease with preserved left ventricular systolic function. She was found to have severe diffuse LAD stenosis and severe RCA stenosis. Her left ventricular function has been preserved.  Since surgery, she has done quite well. She has had no further exertional chest pain or shortness of breath. She denies leg pain or swelling. She denies palpitations, lightheadedness, orthopnea, or PND. She has been compliant with her medications. Her only major concern at this point is that her blood pressure has been elevated. Systolic readings have been in the 150s consistently. Her other concern is that she has been more forgetful than in the past. She denies any medication changes since her surgery.  Outpatient Encounter Prescriptions as of 05/19/2013  Medication Sig Dispense Refill  . ALPRAZolam (XANAX) 0.5 MG tablet Take 0.5 mg by mouth at bedtime as needed for sleep.      Marland Kitchen aspirin 325 MG tablet Take 325 mg by mouth daily.      Marland Kitchen atenolol (TENORMIN) 25 MG tablet Take 25 mg by mouth 2 (two) times daily.       Marland Kitchen buPROPion (WELLBUTRIN SR) 150 MG 12 hr tablet Take 150 mg by mouth 2 (two) times daily.      . fenofibrate 160 MG tablet Take 160 mg by mouth at bedtime.       . ferrous sulfate 325 (65 FE) MG tablet Take 1 tablet (325 mg total) by mouth daily with breakfast.  30 tablet  0  . glipiZIDE (GLUCOTROL) 10 MG tablet Take 10 mg by mouth 2 (two) times daily before a meal.      . insulin aspart (NOVOLOG)  100 UNIT/ML injection Inject 0-8 Units into the skin daily as needed for high blood sugar.      . insulin glargine (LANTUS) 100 UNIT/ML injection Inject 70 Units into the skin at bedtime.      Marland Kitchen lisinopril (PRINIVIL,ZESTRIL) 10 MG tablet Take 1 tablet (10 mg total) by mouth daily.  90 tablet  3  . magnesium oxide (MAG-OX) 400 MG tablet Take 400 mg by mouth daily.      . metformin (FORTAMET) 1000 MG (OSM) 24 hr tablet Take 1,000 mg by mouth 2 (two) times daily.      . nitrofurantoin (MACRODANTIN) 50 MG capsule Take 50 mg by mouth at bedtime.      Marland Kitchen omeprazole (PRILOSEC) 40 MG capsule Take 40 mg by mouth daily with breakfast.      . Potassium Gluconate 595 MG CAPS Take 595 mg by mouth daily.      . pravastatin (PRAVACHOL) 40 MG tablet Take 40 mg by mouth at bedtime.       Marland Kitchen PRENATAL VITAMINS PO Take by mouth daily.      . traMADol (ULTRAM) 50 MG tablet Take 1 tablet (50 mg total) by mouth every 6 (six) hours as needed for pain.  100 tablet  0  . [DISCONTINUED] lisinopril (PRINIVIL,ZESTRIL) 2.5 MG tablet Take 1 tablet (2.5 mg total) by mouth daily.  30 tablet  3  . [DISCONTINUED] oxyCODONE (OXY IR/ROXICODONE) 5 MG immediate  release tablet Take 1-2 tablets (5-10 mg total) by mouth every 4 (four) hours as needed.  50 tablet  0  . [DISCONTINUED] zolpidem (AMBIEN) 5 MG tablet Take 5 mg by mouth at bedtime as needed for sleep.       No facility-administered encounter medications on file as of 05/19/2013.    No Known Allergies  Past Medical History  Diagnosis Date  . Endometriosis   . Carotid artery stenosis   . CAD (coronary artery disease)   . Hyperlipemia   . Hypertension   . Hx of transfusion   . Anxiety   . Arthritis   . Diabetes mellitus     diagnosed - 1999  . GERD (gastroesophageal reflux disease)     "I have lots of acid"   . Cancer     basal cell - nose   . Anemia     after tubal preg. - rec'd bld. transfusion    . Sleep apnea     uses CPAP q night - assessment, 2011- in  [facility]   . 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    LIMA to LAD, SVG to RCA, EVH via right thigh    ROS: Negative except as per HPI  BP 168/75  Pulse 77  Ht 5\' 2"  (1.575 m)  Wt 153 lb 9.6 oz (69.673 kg)  BMI 28.09 kg/m2  PHYSICAL EXAM: Pt is alert and oriented, NAD HEENT: normal Neck: JVP - normal, carotids 2+= without bruits Lungs: CTA bilaterally CV: RRR without murmur or gallop Chest: Sternotomy scar healing well Abd: soft, NT, Positive BS, no hepatomegaly Ext: no C/C/E, distal pulses intact and equal Skin: warm/dry no rash  EKG:  Sinus rhythm 78 beats per minute, cannot rule out age-indeterminate anterior infarct, cannot rule out age indeterminate inferior infarct, otherwise within normal limits   ASSESSMENT AND PLAN: 1. Coronary artery disease, native vessel. The patient is status post 2 vessel CABG with the LIMA to LAD and vein graft to RCA. She is now 2 months out from surgery. She is stable on her current medical program with no anginal symptoms. She has had an uncomplicated early post CABG course and will return in 3 months for followup.  2. Essential hypertension. Blood pressure control is suboptimal. Recommend increase lisinopril from 2.5 to10 mg daily. She will continue on atenolol 25 mg twice daily. The patient will continue to monitor her blood pressure at home and if readings are consistently greater than 140/90, she will call and. Otherwise I will see her back in 3 months.  3. Hyperlipidemia. Lipids have been followed by her primary care physician. She takes pravastatin 40 mg daily.  4. Type 2 diabetes. She's treated with a combination of insulin, metformin, and glipizide. Also followed by her primary care physician.  For followup, I will see her back in 3 months. Again, she will call back prior to that visit for med titration if her blood pressure remains elevated.  Tonny Bollman 05/19/2013 6:04 PM

## 2013-06-28 ENCOUNTER — Telehealth: Payer: Self-pay | Admitting: Cardiovascular Disease

## 2013-06-28 NOTE — Telephone Encounter (Signed)
New problem   Need to know it pt need to be premedicated b/f dental procedure. Please advise

## 2013-06-28 NOTE — Telephone Encounter (Signed)
I spoke with Kennith Center at Dr. Kandis Mannan office who questioned whether patient needs antibiotics prior to dental cleaning/exam/xrays tomorrow and/or if further work is needed following exam.  I advised that patient does not need antibiotics unless patient has a hx of previous endocarditis which was not documented upon chart review.  I advised Kennith Center to question patient regarding this and advised that CABG is not an indication for antibiotic therapy.  Tracey verbalized understanding.

## 2013-09-24 ENCOUNTER — Encounter: Payer: Self-pay | Admitting: Nurse Practitioner

## 2013-09-24 ENCOUNTER — Ambulatory Visit (INDEPENDENT_AMBULATORY_CARE_PROVIDER_SITE_OTHER): Payer: Medicare Other | Admitting: Cardiovascular Disease

## 2013-09-24 ENCOUNTER — Encounter: Payer: Self-pay | Admitting: Cardiovascular Disease

## 2013-09-24 VITALS — BP 208/92 | HR 75 | Ht 62.0 in | Wt 168.4 lb

## 2013-09-24 DIAGNOSIS — I2510 Atherosclerotic heart disease of native coronary artery without angina pectoris: Secondary | ICD-10-CM

## 2013-09-24 DIAGNOSIS — Z951 Presence of aortocoronary bypass graft: Secondary | ICD-10-CM

## 2013-09-24 LAB — BASIC METABOLIC PANEL
BUN: 20 mg/dL (ref 6–23)
CO2: 25 mEq/L (ref 19–32)
Calcium: 10 mg/dL (ref 8.4–10.5)
Chloride: 95 mEq/L — ABNORMAL LOW (ref 96–112)
Creat: 1.37 mg/dL — ABNORMAL HIGH (ref 0.50–1.10)
Glucose, Bld: 80 mg/dL (ref 70–99)
Potassium: 3.9 mEq/L (ref 3.5–5.3)
Sodium: 133 mEq/L — ABNORMAL LOW (ref 135–145)

## 2013-09-24 LAB — LIPID PANEL
Cholesterol: 193 mg/dL (ref 0–200)
HDL: 37 mg/dL — ABNORMAL LOW (ref >39)
LDL Cholesterol: 110 mg/dL — ABNORMAL HIGH (ref 0–99)
Total CHOL/HDL Ratio: 5.2 Ratio
Triglycerides: 229 mg/dL — ABNORMAL HIGH (ref <150)
VLDL: 46 mg/dL — ABNORMAL HIGH (ref 0–40)

## 2013-09-24 LAB — PROTIME-INR
INR: 1 (ref <1.50)
Prothrombin Time: 13.1 seconds (ref 11.6–15.2)

## 2013-09-24 MED ORDER — AMLODIPINE BESYLATE 5 MG PO TABS: 5.0000 mg | ORAL_TABLET | Freq: Every day | ORAL | Status: DC

## 2013-09-24 MED ORDER — LISINOPRIL 20 MG PO TABS: 20.0000 mg | ORAL_TABLET | Freq: Every day | ORAL | Status: DC

## 2013-09-24 NOTE — Patient Instructions (Addendum)
Your physician has requested that you have a cardiac catheterization. Cardiac catheterization is used to diagnose and/or treat various heart conditions. Doctors may recommend this procedure for a number of different reasons. The most common reason is to evaluate chest pain. Chest pain can be a symptom of coronary artery disease (CAD), and cardiac catheterization can show whether plaque is narrowing or blocking your heart's arteries. This procedure is also used to evaluate the valves, as well as measure the blood flow and oxygen levels in different parts of your heart. For further information please visit [email or web address]. Please follow instruction sheet, as given.  Your physician recommends that you have lab work today for pre-catheterization labs  Your physician has recommended you make the following change in your medication:  INCREASE Lisinopril to 20 mg Daily START Amlodipine 5 mg Daily

## 2013-09-24 NOTE — Progress Notes (Signed)
HPI:   63 year old woman presenting for follow-up evaluation. The patient has coronary artery disease and she was diagnosed with 3 vessel CAD after undergoing cardiac cath at [facility] in June 2014. She ultimately underwent CABG 03/10/2013. She was treated with the LIMA to LAD and saphenous vein graft to distal RCA.   I initially saw her after CABG and she was doing well. She has been compliant with her medications. Unfortunately she has developed chest discomfort with exertion, even at a fairly low level of activity. Several weeks after CABG she began to develop dyspnea with exertion and a 'squeezing sensation' in her chest. Symptoms are resolved with rest. She has had chest pain even with 'moving too fast' within her house. Symptoms are predictable with certain activities. She also has noted increased blood pressures on a consistent basis. Home BP is generally running around 170 mmHg.   She denies edema, orthopnea, or PND. No fever, chills, cough, or other complaints. .  Outpatient Encounter Prescriptions as of 09/24/2013  Medication Sig  . ALPRAZolam (XANAX) 0.5 MG tablet Take 0.5 mg by mouth at bedtime as needed for sleep.  Marland Kitchen aspirin 325 MG tablet Take 325 mg by mouth daily.  Marland Kitchen atenolol (TENORMIN) 25 MG tablet Take 25 mg by mouth 2 (two) times daily.   Marland Kitchen buPROPion (WELLBUTRIN SR) 150 MG 12 hr tablet Take 150 mg by mouth 2 (two) times daily.  . fenofibrate 160 MG tablet Take 160 mg by mouth at bedtime.   . ferrous sulfate 325 (65 FE) MG tablet Take 1 tablet (325 mg total) by mouth daily with breakfast.  . glipiZIDE (GLUCOTROL) 10 MG tablet Take 10 mg by mouth 2 (two) times daily before a meal.  . insulin aspart (NOVOLOG) 100 UNIT/ML injection Inject 0-8 Units into the skin daily as needed for high blood sugar.  . insulin glargine (LANTUS) 100 UNIT/ML injection Inject 70 Units into the skin at bedtime.  Marland Kitchen lisinopril (PRINIVIL,ZESTRIL) 10 MG tablet Take 1 tablet (10 mg total) by mouth  daily.  . magnesium oxide (MAG-OX) 400 MG tablet Take 400 mg by mouth daily.  . metformin (FORTAMET) 1000 MG (OSM) 24 hr tablet Take 1,000 mg by mouth 2 (two) times daily.  . nitrofurantoin (MACRODANTIN) 50 MG capsule Take 50 mg by mouth at bedtime.  Marland Kitchen omeprazole (PRILOSEC) 40 MG capsule Take 40 mg by mouth daily with breakfast.  . pravastatin (PRAVACHOL) 40 MG tablet Take 40 mg by mouth at bedtime.   . [DISCONTINUED] Potassium Gluconate 595 MG CAPS Take 595 mg by mouth daily.  . [DISCONTINUED] PRENATAL VITAMINS PO Take by mouth daily.  . [DISCONTINUED] traMADol (ULTRAM) 50 MG tablet Take 1 tablet (50 mg total) by mouth every 6 (six) hours as needed for pain.    No Known Allergies  Past Medical History  Diagnosis Date  . Endometriosis   . Carotid artery stenosis   . CAD (coronary artery disease)   . Hyperlipemia   . Hypertension   . Hx of transfusion   . Anxiety   . Arthritis   . Diabetes mellitus     diagnosed - 1999  . GERD (gastroesophageal reflux disease)     "I have lots of acid"   . Cancer     basal cell - nose   . Anemia     after tubal preg. - rec'd bld. transfusion    . Sleep apnea     uses CPAP q night - assessment, 2011- in [place]   . 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    LIMA to LAD, SVG to RCA, EVH via right thigh    ROS: Negative except as per HPI  BP 208/92  Pulse 75  Ht 5' 2" (1.575 m)  Wt 168 lb 6.4 oz (76.386 kg)  BMI 30.79 kg/m2  PHYSICAL EXAM: Pt is alert and oriented, pleasant overweight woman in NAD HEENT: normal Neck: JVP - normal, carotids 2+= with soft bilateral bruits Lungs: CTA bilaterally CV: RRR with grade 2/6 systolic ejection murmur at the LSB Chest: Sternotomy scar healing well Abd: soft, NT, Positive BS, no hepatomegaly Ext: no C/C/E, distal pulses intact and equal Skin: warm/dry no rash  EKG:  Normal sinus rhythm 75 beats per minute, rightward axis, otherwise within normal limits.  ASSESSMENT AND PLAN: 1. Coronary artery disease,  native vessel. The patient is status post 2 vessel CABG with the LIMA to LAD and vein graft to RCA. She has developed recurrent symptoms of angina, CCS Class 3. Possibilities include early SVG failure, progressive native vessel disease (LCx small but not grafted), and/or hypertensive-related. I have recommended cardiac cath and possible PCI to definitively evaluate considering progressive symptoms. Reviewed risks, indication, and alternatives with the patient who understands and agrees to proceed.  2. HTN, uncontrolled. BP recheck is 178/84. Recommend add amlodipine 5 mg daily and increase lisinopril to 20 mg. Continue to follow.  3. Hyperlipidemia. Lipids have been followed by her primary care physician. She takes pravastatin 40 mg daily.  4. Type 2 diabetes. She's treated with a combination of insulin, metformin, and glipizide. Followed by her primary care physician. Needs to hold metformin prior to cath.  Alvester Eads 09/24/2013 3:41 PM      

## 2013-09-27 ENCOUNTER — Telehealth: Payer: Self-pay | Admitting: Cardiovascular Disease

## 2013-09-27 NOTE — Telephone Encounter (Addendum)
Called patient concerning her BP. She states that her BP over the weekend was 160 to 170 over 85 to 90 and HR 69 to 74. She is concerned about her BP because she is scheduled for a heart cath on 2/5. Currently taking Norvasc 5 mg, Atenolol 25mg  BID, and Lisinopril 20mg  every day. She states that last night she took an extra Lisinopril 10mg  because her pressure was 175/90. Advised will forward message to Dr.Cooper and call her back.

## 2013-09-27 NOTE — Telephone Encounter (Signed)
New Problem:  Pt is c/o her BP. Pt states her BP has been high. She states Dr. Burt Knack changed her meds on 1/30.  Pt states yesterday morning her BP was 175/90 and this morning it was 169/85. PT would like to speak to a nurse.

## 2013-09-27 NOTE — Telephone Encounter (Signed)
Line busy, will try again.

## 2013-09-28 ENCOUNTER — Encounter (HOSPITAL_COMMUNITY): Payer: Self-pay | Admitting: Pharmacy Technician

## 2013-09-28 MED ORDER — AMLODIPINE BESYLATE 10 MG PO TABS: 10.0000 mg | ORAL_TABLET | Freq: Every day | ORAL | Status: DC

## 2013-09-28 NOTE — Telephone Encounter (Signed)
Please increase norvasc to 10 mg. thx

## 2013-09-28 NOTE — Telephone Encounter (Signed)
Called and left message for call back.

## 2013-09-28 NOTE — Telephone Encounter (Signed)
Patient called back. She will increase Norvasc to 10mg  every day.

## 2013-09-29 ENCOUNTER — Telehealth: Payer: Self-pay | Admitting: Nurse Practitioner

## 2013-09-29 DIAGNOSIS — I2510 Atherosclerotic heart disease of native coronary artery without angina pectoris: Secondary | ICD-10-CM

## 2013-09-29 NOTE — Telephone Encounter (Signed)
Message copied by Emmaline Life on Wed Sep 29, 2013  9:30 AM ------      Message from: [name]      Created: Tue Sep 28, 2013 10:28 PM       Labs reviewed. Advise push fluids. Should hold metformin and lisinopril day before and day of cath. Repeat BMET on arrival for procedure. thx ------

## 2013-09-29 NOTE — Telephone Encounter (Signed)
Results and plan of care reviewed with patient (see lab encounter) and order for repeat bmet in epic for 2/5 pre-cath.

## 2013-09-30 ENCOUNTER — Encounter (HOSPITAL_COMMUNITY): Payer: Self-pay | Admitting: General Practice

## 2013-09-30 ENCOUNTER — Encounter (HOSPITAL_COMMUNITY)
Admission: RE | Disposition: A | Discharge status: Home / Self Care | Payer: Medicare Other | Source: Ambulatory Visit | Attending: Cardiovascular Disease

## 2013-09-30 ENCOUNTER — Ambulatory Visit (HOSPITAL_COMMUNITY)
Admission: RE | Admit: 2013-09-30 | Discharge: 2013-10-01 | Disposition: A | Discharge status: Home / Self Care | Payer: Medicare Other | Source: Ambulatory Visit | Attending: Cardiovascular Disease | Admitting: Cardiovascular Disease

## 2013-09-30 DIAGNOSIS — M129 Arthropathy, unspecified: Secondary | ICD-10-CM | POA: Insufficient documentation

## 2013-09-30 DIAGNOSIS — E119 Type 2 diabetes mellitus without complications: Secondary | ICD-10-CM | POA: Insufficient documentation

## 2013-09-30 DIAGNOSIS — E785 Hyperlipidemia, unspecified: Secondary | ICD-10-CM | POA: Insufficient documentation

## 2013-09-30 DIAGNOSIS — I6529 Occlusion and stenosis of unspecified carotid artery: Secondary | ICD-10-CM | POA: Insufficient documentation

## 2013-09-30 DIAGNOSIS — I2510 Atherosclerotic heart disease of native coronary artery without angina pectoris: Secondary | ICD-10-CM | POA: Insufficient documentation

## 2013-09-30 DIAGNOSIS — F411 Generalized anxiety disorder: Secondary | ICD-10-CM | POA: Insufficient documentation

## 2013-09-30 DIAGNOSIS — K219 Gastro-esophageal reflux disease without esophagitis: Secondary | ICD-10-CM | POA: Insufficient documentation

## 2013-09-30 DIAGNOSIS — Z792 Long term (current) use of antibiotics: Secondary | ICD-10-CM | POA: Insufficient documentation

## 2013-09-30 DIAGNOSIS — Z951 Presence of aortocoronary bypass graft: Secondary | ICD-10-CM

## 2013-09-30 DIAGNOSIS — N809 Endometriosis, unspecified: Secondary | ICD-10-CM | POA: Insufficient documentation

## 2013-09-30 DIAGNOSIS — I10 Essential (primary) hypertension: Secondary | ICD-10-CM | POA: Insufficient documentation

## 2013-09-30 DIAGNOSIS — Z794 Long term (current) use of insulin: Secondary | ICD-10-CM | POA: Insufficient documentation

## 2013-09-30 DIAGNOSIS — Z7982 Long term (current) use of aspirin: Secondary | ICD-10-CM | POA: Insufficient documentation

## 2013-09-30 DIAGNOSIS — G4730 Sleep apnea, unspecified: Secondary | ICD-10-CM | POA: Insufficient documentation

## 2013-09-30 DIAGNOSIS — I209 Angina pectoris, unspecified: Secondary | ICD-10-CM | POA: Insufficient documentation

## 2013-09-30 HISTORY — PX: LEFT HEART CATHETERIZATION WITH CORONARY/GRAFT ANGIOGRAM: SHX5450

## 2013-09-30 HISTORY — PX: CORONARY STENT PLACEMENT: SHX1402

## 2013-09-30 LAB — GLUCOSE, CAPILLARY
Glucose-Capillary: 212 mg/dL — ABNORMAL HIGH (ref 70–99)
Glucose-Capillary: 322 mg/dL — ABNORMAL HIGH (ref 70–99)
Glucose-Capillary: 283 mg/dL — ABNORMAL HIGH (ref 70–99)

## 2013-09-30 LAB — BASIC METABOLIC PANEL
BUN: 22 mg/dL (ref 6–23)
CO2: 24 mEq/L (ref 19–32)
Calcium: 9.7 mg/dL (ref 8.4–10.5)
Chloride: 89 mEq/L — ABNORMAL LOW (ref 96–112)
Creatinine, Ser: 1.18 mg/dL — ABNORMAL HIGH (ref 0.50–1.10)
GFR calc Af Amer: 56 mL/min — ABNORMAL LOW (ref >90)
GFR calc non Af Amer: 48 mL/min — ABNORMAL LOW (ref >90)
Glucose, Bld: 243 mg/dL — ABNORMAL HIGH (ref 70–99)
Potassium: 4.4 mEq/L (ref 3.7–5.3)
Sodium: 132 mEq/L — ABNORMAL LOW (ref 137–147)

## 2013-09-30 LAB — CBC
HCT: 40.1 % (ref 36.0–46.0)
Hemoglobin: 14.5 g/dL (ref 12.0–15.0)
MCH: 30 pg (ref 26.0–34.0)
MCHC: 36.2 g/dL — ABNORMAL HIGH (ref 30.0–36.0)
MCV: 82.9 fL (ref 78.0–100.0)
Platelets: 242 10*3/uL (ref 150–400)
RBC: 4.84 MIL/uL (ref 3.87–5.11)
RDW: 12.4 % (ref 11.5–15.5)
WBC: 7.6 10*3/uL (ref 4.0–10.5)

## 2013-09-30 LAB — POCT ACTIVATED CLOTTING TIME: Activated Clotting Time: 337 seconds

## 2013-09-30 SURGERY — LEFT HEART CATHETERIZATION WITH CORONARY/GRAFT ANGIOGRAM
Anesthesia: LOCAL

## 2013-09-30 MED ORDER — MAGNESIUM OXIDE 400 (241.3 MG) MG PO TABS
400.0000 mg | ORAL_TABLET | Freq: Every day | ORAL | Status: DC
  Administered 2013-09-30: 400 mg via ORAL
  Filled 2013-09-30 (×2): qty 1

## 2013-09-30 MED ORDER — AMLODIPINE BESYLATE 10 MG PO TABS
10.0000 mg | ORAL_TABLET | Freq: Every day | ORAL | Status: DC
  Filled 2013-09-30: qty 1

## 2013-09-30 MED ORDER — ASPIRIN 325 MG PO TABS
325.0000 mg | ORAL_TABLET | Freq: Every day | ORAL | Status: DC
  Filled 2013-09-30 (×3): qty 1

## 2013-09-30 MED ORDER — MIDAZOLAM HCL 2 MG/2ML IJ SOLN
INTRAMUSCULAR | Status: AC
  Filled 2013-09-30: qty 2

## 2013-09-30 MED ORDER — HEPARIN (PORCINE) IN NACL 2-0.9 UNIT/ML-% IJ SOLN
INTRAMUSCULAR | Status: AC
  Filled 2013-09-30: qty 1000

## 2013-09-30 MED ORDER — FENOFIBRATE 160 MG PO TABS
160.0000 mg | ORAL_TABLET | Freq: Every day | ORAL | Status: DC
  Administered 2013-09-30: 160 mg via ORAL
  Filled 2013-09-30 (×2): qty 1

## 2013-09-30 MED ORDER — CLOPIDOGREL BISULFATE 300 MG PO TABS
ORAL_TABLET | ORAL | Status: AC
  Filled 2013-09-30: qty 2

## 2013-09-30 MED ORDER — PRAVASTATIN SODIUM 40 MG PO TABS
40.0000 mg | ORAL_TABLET | Freq: Every day | ORAL | Status: DC
  Administered 2013-09-30: 40 mg via ORAL
  Filled 2013-09-30 (×2): qty 1

## 2013-09-30 MED ORDER — LIDOCAINE HCL (PF) 1 % IJ SOLN
INTRAMUSCULAR | Status: AC
  Filled 2013-09-30: qty 30

## 2013-09-30 MED ORDER — SODIUM CHLORIDE 0.9 % IJ SOLN
3.0000 mL | INTRAMUSCULAR | Status: DC | PRN
Start: 2013-09-30 — End: 2013-10-01

## 2013-09-30 MED ORDER — ACETAMINOPHEN 325 MG PO TABS: 650.0000 mg | ORAL_TABLET | ORAL | Status: DC | PRN

## 2013-09-30 MED ORDER — NITROGLYCERIN 0.2 MG/ML ON CALL CATH LAB
INTRAVENOUS | Status: AC
  Filled 2013-09-30: qty 1

## 2013-09-30 MED ORDER — BIVALIRUDIN 250 MG IV SOLR
INTRAVENOUS | Status: AC
  Filled 2013-09-30: qty 250

## 2013-09-30 MED ORDER — SODIUM CHLORIDE 0.9 % IV SOLN
250.0000 mL | INTRAVENOUS | Status: DC | PRN
Start: 2013-09-30 — End: 2013-09-30

## 2013-09-30 MED ORDER — INSULIN ASPART 100 UNIT/ML ~~LOC~~ SOLN
0.0000 [IU] | Freq: Three times a day (TID) | SUBCUTANEOUS | Status: DC
  Administered 2013-09-30: 11 [IU] via SUBCUTANEOUS
  Administered 2013-10-01: 15 [IU] via SUBCUTANEOUS

## 2013-09-30 MED ORDER — PANTOPRAZOLE SODIUM 40 MG PO TBEC
40.0000 mg | DELAYED_RELEASE_TABLET | Freq: Every day | ORAL | Status: DC
  Administered 2013-09-30 – 2013-10-01 (×2): 40 mg via ORAL
  Filled 2013-09-30 (×3): qty 1

## 2013-09-30 MED ORDER — SODIUM CHLORIDE 0.9 % IJ SOLN: 3.0000 mL | INTRAMUSCULAR | Status: DC | PRN

## 2013-09-30 MED ORDER — SODIUM CHLORIDE 0.9 % IV SOLN: 250.0000 mL | INTRAVENOUS | Status: DC | PRN

## 2013-09-30 MED ORDER — SIMVASTATIN 10 MG PO TABS: 10.0000 mg | ORAL_TABLET | Freq: Every day | ORAL | Status: DC

## 2013-09-30 MED ORDER — ONDANSETRON HCL 4 MG/2ML IJ SOLN
4.0000 mg | Freq: Four times a day (QID) | INTRAMUSCULAR | Status: DC | PRN
Start: 2013-09-30 — End: 2013-10-01

## 2013-09-30 MED ORDER — ASPIRIN 81 MG PO CHEW
81.0000 mg | CHEWABLE_TABLET | ORAL | Status: AC
  Administered 2013-09-30: 81 mg via ORAL
  Filled 2013-09-30: qty 1

## 2013-09-30 MED ORDER — MAGNESIUM OXIDE 400 MG PO TABS
400.0000 mg | ORAL_TABLET | Freq: Every day | ORAL | Status: DC
Start: 2013-09-30 — End: 2013-09-30

## 2013-09-30 MED ORDER — SODIUM CHLORIDE 0.9 % IJ SOLN: 3.0000 mL | Freq: Two times a day (BID) | INTRAMUSCULAR | Status: DC

## 2013-09-30 MED ORDER — FENTANYL CITRATE 0.05 MG/ML IJ SOLN
INTRAMUSCULAR | Status: AC
  Filled 2013-09-30: qty 2

## 2013-09-30 MED ORDER — INSULIN GLARGINE 100 UNIT/ML ~~LOC~~ SOLN
80.0000 [IU] | Freq: Every day | SUBCUTANEOUS | Status: DC
  Administered 2013-09-30: 80 [IU] via SUBCUTANEOUS
  Filled 2013-09-30: qty 0.8

## 2013-09-30 MED ORDER — OXYCODONE-ACETAMINOPHEN 5-325 MG PO TABS: 1.0000 | ORAL_TABLET | ORAL | Status: DC | PRN

## 2013-09-30 MED ORDER — GLIPIZIDE 10 MG PO TABS
10.0000 mg | ORAL_TABLET | Freq: Two times a day (BID) | ORAL | Status: DC
  Administered 2013-09-30 – 2013-10-01 (×2): 10 mg via ORAL
  Filled 2013-09-30 (×4): qty 1

## 2013-09-30 MED ORDER — LISINOPRIL 20 MG PO TABS
20.0000 mg | ORAL_TABLET | Freq: Every day | ORAL | Status: DC
  Administered 2013-09-30: 20 mg via ORAL
  Filled 2013-09-30 (×2): qty 1

## 2013-09-30 MED ORDER — NITROFURANTOIN MACROCRYSTAL 50 MG PO CAPS
50.0000 mg | ORAL_CAPSULE | Freq: Every day | ORAL | Status: DC
  Administered 2013-09-30: 50 mg via ORAL
  Filled 2013-09-30 (×2): qty 1

## 2013-09-30 MED ORDER — ALPRAZOLAM 0.25 MG PO TABS
0.5000 mg | ORAL_TABLET | Freq: Every evening | ORAL | Status: DC | PRN
  Administered 2013-09-30: 0.5 mg via ORAL
  Filled 2013-09-30: qty 2

## 2013-09-30 MED ORDER — SODIUM CHLORIDE 0.9 % IV SOLN
INTRAVENOUS | Status: DC
  Administered 2013-09-30: 07:00:00 via INTRAVENOUS

## 2013-09-30 MED ORDER — CLOPIDOGREL BISULFATE 75 MG PO TABS
75.0000 mg | ORAL_TABLET | Freq: Every day | ORAL | Status: DC
  Administered 2013-10-01: 75 mg via ORAL
  Filled 2013-09-30 (×2): qty 1

## 2013-09-30 MED ORDER — BUPROPION HCL ER (SR) 150 MG PO TB12
150.0000 mg | ORAL_TABLET | Freq: Two times a day (BID) | ORAL | Status: DC
  Administered 2013-09-30: 150 mg via ORAL
  Filled 2013-09-30 (×4): qty 1

## 2013-09-30 MED ORDER — ATENOLOL 25 MG PO TABS
25.0000 mg | ORAL_TABLET | Freq: Two times a day (BID) | ORAL | Status: DC
  Administered 2013-09-30: 25 mg via ORAL
  Filled 2013-09-30 (×3): qty 1

## 2013-09-30 MED ORDER — SODIUM CHLORIDE 0.9 % IV SOLN: 1.0000 mL/kg/h | INTRAVENOUS | Status: AC

## 2013-09-30 NOTE — Interval H&P Note (Signed)
History and Physical Interval Note:  09/30/2013 10:02 AM  Darlene Berry  has presented today for surgery, with the diagnosis of Chest pain  The various methods of treatment have been discussed with the patient and family. After consideration of risks, benefits and other options for treatment, the patient has consented to  Procedure(s): LEFT HEART CATHETERIZATION WITH CORONARY/GRAFT ANGIOGRAM (N/A) as a surgical intervention .  The patient's history has been reviewed, patient examined, no change in status, stable for surgery.  I have reviewed the patient's chart and labs.  Questions were answered to the patient's satisfaction.    Cath Lab Visit (complete for each Cath Lab visit)  Clinical Evaluation Leading to the Procedure:   ACS: no  Non-ACS:    Anginal Classification: CCS III  Anti-ischemic medical therapy: Maximal Therapy (2 or more classes of medications)  Non-Invasive Test Results: No non-invasive testing performed  Prior CABG: Previous CABG       [name]

## 2013-09-30 NOTE — CV Procedure (Signed)
Cardiac Catheterization Procedure Note  Name: Darlene Berry MRN: [id number] DOB: [birth year]  Procedure: Left Heart Cath, Selective Coronary Angiography, LV angiography, SVG angiography, LIMA angiography, PTCA/Stent of the native LCx, Perclose of the RFA  Indication: CCS class III angina on optimal medical therapy. The patient is status post recent CABG. She underwent 2 vessel bypass with the LIMA to LAD and vein graft RCA. Her left circumflex was small and nongraftable. She is continuing to have significant angina after surgery and is brought in for cardiac catheterization and possible PCI.  Diagnostic Procedure Details: The right groin was prepped, draped, and anesthetized with 1% lidocaine. Using the modified Seldinger technique, a 5 French sheath was introduced into the right femoral artery. Standard Judkins catheters were used for selective coronary angiography, vein graft angiography,  LIMA angiography, and left ventriculography. Catheter exchanges were performed over a wire.  The diagnostic procedure was well-tolerated without immediate complications.  PROCEDURAL FINDINGS Hemodynamics: AO 112/50 LV 111/10  Coronary angiography: Coronary dominance: right  Left mainstem: The left main is widely patent. It divides into the LAD and left circumflex.  Left anterior descending (LAD): The LAD is moderately calcified. The proximal vessel has diffuse 50% stenosis. The first diagonal has ostial 70% stenosis. The mid LAD has 50% stenosis. There is diffuse 70% stenosis just before the LIMA insertion site. The LIMA is widely patent with competitive filling of the mid and distal LAD. The second diagonal is larger in caliber with no significant disease. The third diagonal is medium in caliber with no significant disease. The first septal perforator is a long vessel with marked intramyocardial bridging present.  Left circumflex (LCx): The left circumflex is diffusely diseased. There is a long  segment 40-50% proximal vessel stenosis. There is an eccentric 80% stenosis in the first obtuse marginal. That branch divides into twin vessels.  Right coronary artery (RCA): The native right coronary artery is moderately calcified. The vessel is very large in distribution. There is competitive filling of the distal vessel. The mid vessel has a long segment 70% stenosis. The distal vessel has mild irregularity. The PDA and PLA branches are patent.  Saphenous vein graft RCA: The graft is widely patent. The distal anastomosis is just at the bifurcation of the PDA and PLA branch. There is mild stenosis at the PLA ostium. The PDA is widely patent. Both branch vessels are very large in caliber without significant disease.  LIMA to LAD: Widely patent without significant stenosis. The distal insertion site is widely patent.  Left ventriculography: Left ventricular systolic function is normal, LVEF is estimated at 65%, there is no significant mitral regurgitation   PCI Procedure Note:  Following the diagnostic procedure, the decision was made to proceed with PCI of the left circumflex. This vessel was then grafted. It is small to medium in caliber but there is a significant eccentric stenosis in the first obtuse marginal branch. The proximal vessel has mild to moderate diffuse disease which I think is most appropriate for medical therapy. The patient was loaded with Plavix 600 mg on the table. Weight-based bivalirudin was given for anticoagulation. Once a therapeutic ACT was achieved, a 5 [place] EBU guide catheter was inserted.  A cougar coronary guidewire was used to cross the lesion.   The lesion was then primarily stented with a 2.25x15 mm Xience Alpine drug-eluting stent.  The stent was postdilated with a 2.25 mm noncompliant balloon to 14 atm.  Following PCI, there was 0% residual stenosis and TIMI-3 flow.  Final angiography confirmed an excellent result. Femoral hemostasis was achieved with a Perclose device.   The patient tolerated the PCI procedure well. There were no immediate procedural complications.  The patient was transferred to the post catheterization recovery area for further monitoring.  PCI Data: Vessel - LCx/Segment - OM1 Percent Stenosis (pre)  80 TIMI-flow 3 Stent 2.25x15 mm Xience DES Percent Stenosis (post) 0 TIMI-flow (post) 3  Final Conclusions:   1. Severe three-vessel coronary artery disease with continued patency of the LIMA to LAD and saphenous vein graft to distal RCA 2. Severe residual stenosis of the native left circumflex (ungrafted branch) treated successfully with PCI using a single drug-eluting stent 3. Normal left ventricular systolic function  Recommendations: DAPT with ASA and plavix at least 12 months. Continue aggressive medical therapy.  [name] 09/30/2013, 11:12 AM

## 2013-09-30 NOTE — H&P (View-Only) (Signed)
HPI:   63 year old woman presenting for follow-up evaluation. The patient has coronary artery disease and she was diagnosed with 3 vessel CAD after undergoing cardiac cath at [facility] in June 2014. She ultimately underwent CABG 03/10/2013. She was treated with the LIMA to LAD and saphenous vein graft to distal RCA.   I initially saw her after CABG and she was doing well. She has been compliant with her medications. Unfortunately she has developed chest discomfort with exertion, even at a fairly low level of activity. Several weeks after CABG she began to develop dyspnea with exertion and a 'squeezing sensation' in her chest. Symptoms are resolved with rest. She has had chest pain even with 'moving too fast' within her house. Symptoms are predictable with certain activities. She also has noted increased blood pressures on a consistent basis. Home BP is generally running around 170 mmHg.   She denies edema, orthopnea, or PND. No fever, chills, cough, or other complaints. .  Outpatient Encounter Prescriptions as of 09/24/2013  Medication Sig  . ALPRAZolam (XANAX) 0.5 MG tablet Take 0.5 mg by mouth at bedtime as needed for sleep.  Marland Kitchen aspirin 325 MG tablet Take 325 mg by mouth daily.  Marland Kitchen atenolol (TENORMIN) 25 MG tablet Take 25 mg by mouth 2 (two) times daily.   Marland Kitchen buPROPion (WELLBUTRIN SR) 150 MG 12 hr tablet Take 150 mg by mouth 2 (two) times daily.  . fenofibrate 160 MG tablet Take 160 mg by mouth at bedtime.   . ferrous sulfate 325 (65 FE) MG tablet Take 1 tablet (325 mg total) by mouth daily with breakfast.  . glipiZIDE (GLUCOTROL) 10 MG tablet Take 10 mg by mouth 2 (two) times daily before a meal.  . insulin aspart (NOVOLOG) 100 UNIT/ML injection Inject 0-8 Units into the skin daily as needed for high blood sugar.  . insulin glargine (LANTUS) 100 UNIT/ML injection Inject 70 Units into the skin at bedtime.  Marland Kitchen lisinopril (PRINIVIL,ZESTRIL) 10 MG tablet Take 1 tablet (10 mg total) by mouth  daily.  . magnesium oxide (MAG-OX) 400 MG tablet Take 400 mg by mouth daily.  . metformin (FORTAMET) 1000 MG (OSM) 24 hr tablet Take 1,000 mg by mouth 2 (two) times daily.  . nitrofurantoin (MACRODANTIN) 50 MG capsule Take 50 mg by mouth at bedtime.  Marland Kitchen omeprazole (PRILOSEC) 40 MG capsule Take 40 mg by mouth daily with breakfast.  . pravastatin (PRAVACHOL) 40 MG tablet Take 40 mg by mouth at bedtime.   . [DISCONTINUED] Potassium Gluconate 595 MG CAPS Take 595 mg by mouth daily.  . [DISCONTINUED] PRENATAL VITAMINS PO Take by mouth daily.  . [DISCONTINUED] traMADol (ULTRAM) 50 MG tablet Take 1 tablet (50 mg total) by mouth every 6 (six) hours as needed for pain.    No Known Allergies  Past Medical History  Diagnosis Date  . Endometriosis   . Carotid artery stenosis   . CAD (coronary artery disease)   . Hyperlipemia   . Hypertension   . Hx of transfusion   . Anxiety   . Arthritis   . Diabetes mellitus     diagnosed - 1999  . GERD (gastroesophageal reflux disease)     "I have lots of acid"   . Cancer     basal cell - nose   . Anemia     after tubal preg. - rec'd bld. transfusion    . Sleep apnea     uses CPAP q night - assessment, 2011- in [place]   . 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    LIMA to LAD, SVG to RCA, EVH via right thigh    ROS: Negative except as per HPI  BP 208/92  Pulse 75  Ht 5\' 2"  (1.575 m)  Wt 168 lb 6.4 oz (76.386 kg)  BMI 30.79 kg/m2  PHYSICAL EXAM: Pt is alert and oriented, pleasant overweight woman in NAD HEENT: normal Neck: JVP - normal, carotids 2+= with soft bilateral bruits Lungs: CTA bilaterally CV: RRR with grade 2/6 systolic ejection murmur at the LSB Chest: Sternotomy scar healing well Abd: soft, NT, Positive BS, no hepatomegaly Ext: no C/C/E, distal pulses intact and equal Skin: warm/dry no rash  EKG:  Normal sinus rhythm 75 beats per minute, rightward axis, otherwise within normal limits.  ASSESSMENT AND PLAN: 1. Coronary artery disease,  native vessel. The patient is status post 2 vessel CABG with the LIMA to LAD and vein graft to RCA. She has developed recurrent symptoms of angina, CCS Class 3. Possibilities include early SVG failure, progressive native vessel disease (LCx small but not grafted), and/or hypertensive-related. I have recommended cardiac cath and possible PCI to definitively evaluate considering progressive symptoms. Reviewed risks, indication, and alternatives with the patient who understands and agrees to proceed.  2. HTN, uncontrolled. BP recheck is 178/84. Recommend add amlodipine 5 mg daily and increase lisinopril to 20 mg. Continue to follow.  3. Hyperlipidemia. Lipids have been followed by her primary care physician. She takes pravastatin 40 mg daily.  4. Type 2 diabetes. She's treated with a combination of insulin, metformin, and glipizide. Followed by her primary care physician. Needs to hold metformin prior to cath.  [name] 09/24/2013 3:41 PM

## 2013-10-01 DIAGNOSIS — I209 Angina pectoris, unspecified: Secondary | ICD-10-CM

## 2013-10-01 DIAGNOSIS — Z951 Presence of aortocoronary bypass graft: Secondary | ICD-10-CM

## 2013-10-01 DIAGNOSIS — I2510 Atherosclerotic heart disease of native coronary artery without angina pectoris: Secondary | ICD-10-CM

## 2013-10-01 LAB — GLUCOSE, CAPILLARY
Glucose-Capillary: 191 mg/dL — ABNORMAL HIGH (ref 70–99)
Glucose-Capillary: 481 mg/dL — ABNORMAL HIGH (ref 70–99)
Glucose-Capillary: 470 mg/dL — ABNORMAL HIGH (ref 70–99)
Glucose-Capillary: 510 mg/dL — ABNORMAL HIGH (ref 70–99)
Glucose-Capillary: 332 mg/dL — ABNORMAL HIGH (ref 70–99)

## 2013-10-01 LAB — BASIC METABOLIC PANEL
BUN: 23 mg/dL (ref 6–23)
CO2: 22 mEq/L (ref 19–32)
Calcium: 9.3 mg/dL (ref 8.4–10.5)
Chloride: 95 mEq/L — ABNORMAL LOW (ref 96–112)
Creatinine, Ser: 1.27 mg/dL — ABNORMAL HIGH (ref 0.50–1.10)
GFR calc Af Amer: 51 mL/min — ABNORMAL LOW (ref >90)
GFR calc non Af Amer: 44 mL/min — ABNORMAL LOW (ref >90)
Glucose, Bld: 365 mg/dL — ABNORMAL HIGH (ref 70–99)
Potassium: 4.6 mEq/L (ref 3.7–5.3)
Sodium: 131 mEq/L — ABNORMAL LOW (ref 137–147)

## 2013-10-01 LAB — CBC
HCT: 36.4 % (ref 36.0–46.0)
Hemoglobin: 12.6 g/dL (ref 12.0–15.0)
MCH: 29.3 pg (ref 26.0–34.0)
MCHC: 34.6 g/dL (ref 30.0–36.0)
MCV: 84.7 fL (ref 78.0–100.0)
Platelets: 213 10*3/uL (ref 150–400)
RBC: 4.3 MIL/uL (ref 3.87–5.11)
RDW: 12.5 % (ref 11.5–15.5)
WBC: 6 10*3/uL (ref 4.0–10.5)

## 2013-10-01 MED ORDER — PANTOPRAZOLE SODIUM 40 MG PO TBEC: 40.0000 mg | DELAYED_RELEASE_TABLET | Freq: Every day | ORAL | Status: DC

## 2013-10-01 MED ORDER — CLOPIDOGREL BISULFATE 75 MG PO TABS: 75.0000 mg | ORAL_TABLET | Freq: Every day | ORAL | Status: DC

## 2013-10-01 MED ORDER — INSULIN ASPART 100 UNIT/ML ~~LOC~~ SOLN
15.0000 [IU] | Freq: Once | SUBCUTANEOUS | Status: AC
  Administered 2013-10-01: 10:00:00 15 [IU] via SUBCUTANEOUS

## 2013-10-01 MED FILL — Sodium Chloride IV Soln 0.9%: INTRAVENOUS | Qty: 50 | Status: AC

## 2013-10-01 NOTE — Discharge Instructions (Addendum)
Angiography, Care After °Refer to this sheet in the next few weeks. These instructions provide you with information on caring for yourself after your procedure. Your health care provider may also give you more specific instructions. Your treatment has been planned according to current medical practices, but problems sometimes occur. Call your health care provider if you have any problems or questions after your procedure.  °WHAT TO EXPECT AFTER THE PROCEDURE °After your procedure, it is typical to have the following sensations: °· Minor discomfort or tenderness and a small bump at the catheter insertion site. The bump should usually decrease in size and tenderness within 1 to 2 weeks. °· Any bruising will usually fade within 2 to 4 weeks. °HOME CARE INSTRUCTIONS  °· You may need to keep taking blood thinners if they were prescribed for you. Only take over-the-counter or prescription medicines for pain, fever, or discomfort as directed by your health care provider. °· Do not apply powder or lotion to the site. °· Do not sit in a bathtub, swimming pool, or whirlpool for 5 to 7 days. °· You may shower 24 hours after the procedure. Remove the bandage (dressing) and gently wash the site with plain soap and water. Gently pat the site dry. °· Inspect the site at least twice daily. °· Limit your activity for the first 48 hours. Do not bend, squat, or lift anything over 20 lb (9 kg) or as directed by your health care provider. °· Do not drive home if you are discharged the day of the procedure. Have someone else drive you. Follow instructions about when you can drive or return to work. °SEEK MEDICAL CARE IF: °· You get lightheaded when standing up. °· You have drainage (other than a small amount of blood on the dressing). °· You have chills. °· You have a fever. °· You have redness, warmth, swelling, or pain at the insertion site. °SEEK IMMEDIATE MEDICAL CARE IF:  °· You develop chest pain or shortness of breath, feel faint,  or pass out. °· You have bleeding, swelling larger than a walnut, or drainage from the catheter insertion site. °· You develop pain, discoloration, coldness, or severe bruising in the leg or arm that held the catheter. °· You develop bleeding from any other place, such as the bowels. You may see bright red blood in your urine or stools, or your stools may appear black and tarry. °· You have heavy bleeding from the site. If this happens, hold pressure on the site. °MAKE SURE YOU: °· Understand these instructions. °· Will watch your condition. °· Will get help right away if you are not doing well or get worse. °Document Released: 02/28/2005 Document Revised: 04/14/2013 Document Reviewed: 01/04/2013 °Exit[employer]® Patient Information ©2014 [employer]. ° °

## 2013-10-01 NOTE — Progress Notes (Signed)
Subjective: Denies SP/SOB. Right femoral access site is slightly sore but she denies severe anterior groin, flank and back pain. She ambulated the halls earlier this am w/o difficulty.  Objective: Vital signs in last 24 hours: Temp:  [97.5 F (36.4 C)-97.9 F (36.6 C)] 97.5 F (36.4 C) (02/06 0544) Pulse Rate:  [70-76] 71 (02/06 0544) Resp:  [16] 16 (02/05 1616) BP: (104-152)/(39-55) 130/42 mmHg (02/06 0544) SpO2:  [96 %-100 %] 98 % (02/06 0544) Weight:  [165 lb (74.844 kg)] 165 lb (74.844 kg) (02/06 0544) Last BM Date: 09/30/13  Intake/Output from previous day: 02/05 0701 - 02/06 0700 In: 1212.1 [P.O.:760; I.V.:452.1] Out: -  Intake/Output this shift: Total I/O In: 280 [P.O.:280] Out: -   Medications Current Facility-Administered Medications  Medication Dose Route Frequency Provider Last Rate Last Dose  . 0.9 %  sodium chloride infusion  250 mL Intravenous PRN [name], MD      . acetaminophen (TYLENOL) tablet 650 mg  650 mg Oral Q4H PRN [name], MD      . ALPRAZolam Duanne Moron) tablet 0.5 mg  0.5 mg Oral QHS PRN [name], MD   0.5 mg at 09/30/13 2155  . amLODipine (NORVASC) tablet 10 mg  10 mg Oral Daily [name], MD      . aspirin tablet 325 mg  325 mg Oral Daily [name], MD      . atenolol (TENORMIN) tablet 25 mg  25 mg Oral BID [name], MD   25 mg at 09/30/13 2151  . buPROPion [facility] SR) 12 hr tablet 150 mg  150 mg Oral BID [name], MD   150 mg at 09/30/13 2151  . clopidogrel (PLAVIX) tablet 75 mg  75 mg Oral Q breakfast [name], MD      . fenofibrate tablet 160 mg  160 mg Oral QHS [name], MD   160 mg at 09/30/13 2151  . glipiZIDE (GLUCOTROL) tablet 10 mg  10 mg Oral BID AC [name], MD   10 mg at 09/30/13 1642  . insulin aspart (novoLOG) injection 0-15 Units  0-15 Units Subcutaneous TID WC [name], MD   11 Units at 09/30/13 1825  . insulin glargine (LANTUS) injection 80 Units  80 Units  Subcutaneous QHS [name], MD   80 Units at 09/30/13 2156  . lisinopril (PRINIVIL,ZESTRIL) tablet 20 mg  20 mg Oral Daily [name], MD   20 mg at 09/30/13 1600  . magnesium oxide (MAG-OX) tablet 400 mg  400 mg Oral Daily [name], MD   400 mg at 09/30/13 1600  . nitrofurantoin (MACRODANTIN) capsule 50 mg  50 mg Oral QHS [name], MD   50 mg at 09/30/13 2151  . ondansetron (ZOFRAN) injection 4 mg  4 mg Intravenous Q6H PRN [name], MD      . oxyCODONE-acetaminophen (PERCOCET/ROXICET) 5-325 MG per tablet 1-2 tablet  1-2 tablet Oral Q4H PRN [name], MD      . pantoprazole (PROTONIX) EC tablet 40 mg  40 mg Oral Daily [name], MD   40 mg at 09/30/13 1642  . pravastatin (PRAVACHOL) tablet 40 mg  40 mg Oral q1800 [name], MD   40 mg at 09/30/13 1639  . sodium chloride 0.9 % injection 3 mL  3 mL Intravenous Q12H [name], MD      . sodium chloride 0.9 % injection 3 mL  3 mL Intravenous PRN [name], MD  PE: General appearance: alert, cooperative and no distress Lungs: clear to auscultation bilaterally Heart: regular rate and rhythm, S1, S2 normal, no murmur, click, rub or gallop Extremities: No LEE, right groin: no ecchymosis, slightly tender, no hematoma Pulses: 2+ and symmetric Skin: warm and dry Neurologic: Grossly normal  Lab Results:   Recent Labs  09/30/13 0709 10/01/13 0410  WBC 7.6 6.0  HGB 14.5 12.6  HCT 40.1 36.4  PLT 242 213   BMET  Recent Labs  09/30/13 0709 10/01/13 0410  NA 132* 131*  K 4.4 4.6  CL 89* 95*  CO2 24 22  GLUCOSE 243* 365*  BUN 22 23  CREATININE 1.18* 1.27*  CALCIUM 9.7 9.3   PT/INR No results found for this basename: LABPROT, INR,  in the last 72 hours Cholesterol No results found for this basename: CHOL,  in the last 72 hours Cardiac Enzymes No components found with this basename: TROPONIN,  CKMB,   Studies/Results:  [facility] 09/30/13   Final Conclusions:  1. Severe  three-vessel coronary artery disease with continued patency of the LIMA to LAD and saphenous vein graft to distal RCA  2. Severe residual stenosis of the native left circumflex (ungrafted branch) treated successfully with PCI using a single drug-eluting stent  3. Normal left ventricular systolic function   Assessment/Plan  Active Problems:   Other and unspecified angina pectoris  Plan: day 1 s/p LHC in the setting of CCS class III angina on optimal medical therapy. She was found to have severe three-vessel coronary artery disease with continued patency of the LIMA to LAD and saphenous vein graft to distal RCA. She had severe residual stenosis of the native left circumflex (ungrafted branch) treated successfully with PCI using a single drug-eluting stent. Systolic function was normal with an EF of 65%. No CP/SOB. Right femoral access site is stable, w/o hematoma. Resume DAPT with ASA + Plavix. HR and BP both stable. Also resume BB, ACE-I, CCB, statin and fenofibrate. She had a slight bump in SCr post cath from 1.18 to 1.27. Will have patient ambulate with cardiac rehab. If she continues to remain stable, plan for discharge home later today.      LOS: 1 day    Brittainy M. Ladoris Gene 10/01/2013 6:55 AM  Patient seen and examined and history reviewed. Agree with above findings and plan. Doing well post PCI. No groin hematoma. Ecg shows poor R wave progression but no change. Renal function is stable. OK for DC today on DAPT. Follow up with Dr. Burt Knack in 2 weeks.  Collier [place] [facility] 10/01/2013 7:29 AM

## 2013-10-01 NOTE — Discharge Summary (Addendum)
Physician Discharge Summary  Patient ID: Darlene Berry MRN: [id number] DOB/AGE: 64-09-1950 64 y.o.  Admit date: 09/30/2013 Discharge date: 10/01/2013  Admission Diagnoses: Angina Pectoris  Discharge Diagnoses:  Active Problems:   Other and unspecified angina pectoris   Discharged Condition: stable  Hospital Course: The patient is a 63 y/o female, followed by Dr. Burt Knack. She has coronary artery disease and she was diagnosed with 3 vessel CAD after undergoing cardiac cath at [facility] in June 2014. She ultimately underwent CABG on 03/10/2013. She was treated with a LIMA to the LAD and saphenous vein graft to the distal RCA. She recently was seen by Dr. Burt Knack in clinic and had complained of exertional angina, even at a fairly low level of activity. She was referred for a LHC to redefine her coronary anatomy.   She presented to [facility] on 09/30/13 to undergo the planned procedure. The cath was performed by Dr. Burt Knack, via the right femoral artery. She was found to have severe three-vessel coronary artery disease with continued patency of the LIMA to LAD and saphenous vein graft to distal RCA.There was severe residual stenosis of the native left circumflex (ungrafted branch) treated successfully with PCI using a single drug-eluting stent. She had normal left ventricular systolic function. She left the cath lab in stable condition. She had no post-operative complications. She had no further chest pain. She ambulated with cardiac rehab w/o difficulty. The right femoral access site remained stable, free from hematoma and bruit. HR, BP and renal function all remained stable. She was placed on DAPT with ASA + Plavix. Her Prilosec was changed to Protonix to limit PPI vs Plavix interaction. She was last seen and examined by Dr. Martinique, who determined she was stable for discharge home. She will f/u with Richardson Dopp, PA-C, in clinic on 10/15/13.    Consults: None  Significant Diagnostic Studies:   LHC  09/30/13 Final Conclusions:  1. Severe three-vessel coronary artery disease with continued patency of the LIMA to LAD and saphenous vein graft to distal RCA  2. Severe residual stenosis of the native left circumflex (ungrafted branch) treated successfully with PCI using a single drug-eluting stent  3. Normal left ventricular systolic function   Treatments: See Hospital Course  Discharge Exam: Blood pressure 108/76, pulse 78, temperature 97.4 F (36.3 C), temperature source Oral, resp. rate 16, height 5\' 2"  (1.575 m), weight 165 lb (74.844 kg), SpO2 98.00%.   Disposition: 01-Home or Self Care      Discharge Orders   Future Appointments Provider Department Dept Phone   10/15/2013 11:10 AM Liliane Shi, PA-C [facility] [phone]   Future Orders Complete By Expires   Diet - low sodium heart healthy  As directed    Discharge instructions  As directed    Comments:     Wait until Sunday to resume Metformin   Driving Restrictions  As directed    Comments:     No driving for 3 days   Increase activity slowly  As directed    Lifting restrictions  As directed    Comments:     No heavy lifting for 3 days       Medication List    STOP taking these medications       omeprazole 20 MG capsule  Commonly known as:  PRILOSEC  Replaced by:  pantoprazole 40 MG tablet      TAKE these medications       ALPRAZolam 0.5 MG tablet  Commonly  known as:  XANAX  Take 0.5 mg by mouth at bedtime as needed for sleep.     amLODipine 10 MG tablet  Commonly known as:  NORVASC  Take 1 tablet (10 mg total) by mouth daily.     aspirin 325 MG tablet  Take 325 mg by mouth daily.     atenolol 25 MG tablet  Commonly known as:  TENORMIN  Take 25 mg by mouth 2 (two) times daily.     buPROPion 150 MG 12 hr tablet  Commonly known as:  WELLBUTRIN SR  Take 150 mg by mouth 2 (two) times daily.     clopidogrel 75 MG tablet  Commonly known as:  PLAVIX  Take 1 tablet (75 mg  total) by mouth daily with breakfast.     fenofibrate 160 MG tablet  Take 160 mg by mouth at bedtime.     glipiZIDE 10 MG tablet  Commonly known as:  GLUCOTROL  Take 10 mg by mouth 2 (two) times daily before a meal.     insulin aspart 100 UNIT/ML injection  Commonly known as:  novoLOG  Inject 0-8 Units into the skin daily as needed for high blood sugar.     insulin glargine 100 UNIT/ML injection  Commonly known as:  LANTUS  Inject 80-90 Units into the skin at bedtime.     lisinopril 20 MG tablet  Commonly known as:  PRINIVIL,ZESTRIL  Take 1 tablet (20 mg total) by mouth daily.     magnesium oxide 400 MG tablet  Commonly known as:  MAG-OX  Take 400 mg by mouth daily.     metformin 1000 MG (OSM) 24 hr tablet  Commonly known as:  FORTAMET  Take 1,000 mg by mouth 2 (two) times daily.     nitrofurantoin 50 MG capsule  Commonly known as:  MACRODANTIN  Take 50 mg by mouth at bedtime.     pantoprazole 40 MG tablet  Commonly known as:  PROTONIX  Take 1 tablet (40 mg total) by mouth daily.     pravastatin 40 MG tablet  Commonly known as:  PRAVACHOL  Take 40 mg by mouth at bedtime.       Follow-up Information   Follow up with Richardson Dopp, PA-C On 10/15/2013. (11:10 am)    Specialty:  Physician Assistant   Contact information:   [address] [place] [address] [phone]      TIME SPENT ON DISCHARGE, INCLUDING PHYSICIAN TIME: >30 MINUTES Signed: Lyda Jester 10/01/2013, 9:17 AM

## 2013-10-01 NOTE — Progress Notes (Signed)
CARDIAC REHAB PHASE I   PRE:  Rate/Rhythm: 77SR  BP:  Supine: 108/76  Sitting:   Standing:    SaO2:   MODE:  Ambulation: 1000 ft   POST:  Rate/Rhythm: 84SR  BP:  Supine: 130/82  Sitting:   Standing:    SaO2:  [id number] Pt walked 1000 ft with steady gait. Tolerated well.  Right groin still a little sore. Stated breathing better after walk. Education completed with pt . Understanding voiced. Declined CRP 2 as too far to drive to program but [facility] and [facility]. Encouraged pt to get back to [facility] and then to [facility]. Reviewed NTG use and plavix. Pt has quit smoking since July with having maybe a couple since then. Congratulated her and reviewed how nicotine affects arteries. Discussed carb counting and handout given.   Graylon Good, RN BSN  10/01/2013 8:32 AM

## 2013-10-01 NOTE — Progress Notes (Signed)
Notified [place] about CBG elevated and followed up until in 300's she is able to d/c home patient denies any complications.

## 2013-10-08 ENCOUNTER — Other Ambulatory Visit: Payer: Self-pay | Admitting: *Deleted

## 2013-10-11 ENCOUNTER — Encounter: Payer: Self-pay | Admitting: Cardiovascular Disease

## 2013-10-15 ENCOUNTER — Ambulatory Visit (INDEPENDENT_AMBULATORY_CARE_PROVIDER_SITE_OTHER): Payer: Medicare Other | Admitting: Physician Assistant

## 2013-10-15 ENCOUNTER — Encounter: Payer: Self-pay | Admitting: Physician Assistant

## 2013-10-15 VITALS — BP 150/70 | HR 75 | Ht 62.0 in | Wt 174.0 lb

## 2013-10-15 DIAGNOSIS — R0602 Shortness of breath: Secondary | ICD-10-CM

## 2013-10-15 DIAGNOSIS — I739 Peripheral vascular disease, unspecified: Secondary | ICD-10-CM

## 2013-10-15 DIAGNOSIS — E785 Hyperlipidemia, unspecified: Secondary | ICD-10-CM

## 2013-10-15 DIAGNOSIS — I10 Essential (primary) hypertension: Secondary | ICD-10-CM

## 2013-10-15 DIAGNOSIS — I2510 Atherosclerotic heart disease of native coronary artery without angina pectoris: Secondary | ICD-10-CM

## 2013-10-15 MED ORDER — HYDROCHLOROTHIAZIDE 12.5 MG PO CAPS: 12.5000 mg | ORAL_CAPSULE | Freq: Every day | ORAL | Status: DC

## 2013-10-15 NOTE — Patient Instructions (Signed)
START HCTZ 12.5 MG DAILY; RX SENT IN  YOU HAVE BEEN GIVEN AN RX FOR LAB WORK WITH YOUR PCP IN 1 WEEK WITH THE RESULTS TO BE FAXED TO [place], [place]  Your physician recommends that you schedule a follow-up appointment in: 12/06/13 @ 3:45  WITH DR. Burt Knack  PLEASE SCHEDULE A LOWER EXTREMITY DOPPLER WITH ABI'S DX CLAUDICATION

## 2013-10-15 NOTE — Progress Notes (Signed)
[address], [place], [place]  [address] Phone: [phone] Fax:  [phone]  Date:  10/15/2013   ID:  [name], [name] 01-07-1951, MRN [id number]  PCP:  Darlene Brooms, MD  Cardiologist:  Dr. [name]     History of Present Illness: Darlene Berry is a 63 y.o. female with a hx of CAD, s/p CABG in 02/2013, DM, HTN, HL, normal LVF, carotid stenosis s/p L CEA.  She was recently evaluated by Dr. [name] with complaints of chest pain c/w CCS Class 3 angina.  Cardiac cath was arranged.  This demonstrated significant stenosis in the native left circumflex which was un-grafted. This was treated with PCI using a DES.  DAPT recommended x 1 year.     Echo (01/2013 at [facility]):  Mild LVH, EF 65-60%, Gr 2 DD. LHC (09/30/13):  Proximal LAD 50, ostial B1 70, mid LAD 50, 70 prior to LIMA insertion, proximal circumflex 40-50, OM1 80, mid RCA 70, LIMA-LAD patent, SVG-RCA patent; EF 65%.  PCI:  Xience (2.25 x 15 mm) DES to the CFX/OM1 (ungrafted).    Pre-CABG Dopplers:  Bilateral ABIs normal;  Bilateral ICA < 50%.  Since discharge, she denies any further chest pain. She continues to note dyspnea on exertion. She is NYHA 2b-3.  This has been a chronic symptom of hers for quite some time. She denies orthopnea or PND. She does have some mild pedal edema without significant change. She denies syncope or near syncope. She is particularly bothered by bilateral calf pain with exertion. This has been ongoing for years and has gradually gotten worse. She denies any nonhealing ulcers.  Recent Labs: 03/08/2013: ALT 49*  09/24/2013: HDL Cholesterol 37*; LDL (calc) 110*  10/01/2013: Creatinine 1.27*; Hemoglobin 12.6; Potassium 4.6   Wt Readings from Last 3 Encounters:  10/15/13 174 lb (78.926 kg)  10/01/13 165 lb (74.844 kg)  10/01/13 165 lb (74.844 kg)     Past Medical History  Diagnosis Date  . Endometriosis   . Carotid artery stenosis   . CAD (coronary artery disease)   .  Hyperlipemia   . Hypertension   . Hx of transfusion   . Anxiety   . Arthritis   . GERD (gastroesophageal reflux disease)     "I have lots of acid"   . Cancer     basal cell - nose   . Anemia     after tubal preg. - rec'd bld. transfusion    . Sleep apnea     uses CPAP q night - assessment, 2011- in [facility]   . 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    LIMA to LAD, SVG to RCA, EVH via right thigh  . Anginal pain   . Shortness of breath   . Diabetes mellitus     diagnosed - 1999    Current Outpatient Prescriptions  Medication Sig Dispense Refill  . ALPRAZolam (XANAX) 0.5 MG tablet Take 0.5 mg by mouth at bedtime as needed for sleep.      Marland Kitchen amLODipine (NORVASC) 10 MG tablet Take 1 tablet (10 mg total) by mouth daily.  30 tablet  6  . aspirin 325 MG tablet Take 325 mg by mouth daily.      Marland Kitchen atenolol (TENORMIN) 25 MG tablet Take 25 mg by mouth 2 (two) times daily.       Marland Kitchen buPROPion (WELLBUTRIN SR) 150 MG 12 hr tablet Take 150 mg by mouth 2 (two) times daily.      Marland Kitchen  clopidogrel (PLAVIX) 75 MG tablet Take 75 mg by mouth Nightly.      . fenofibrate 160 MG tablet Take 160 mg by mouth at bedtime.       Marland Kitchen glipiZIDE (GLUCOTROL) 10 MG tablet Take 10 mg by mouth 2 (two) times daily before a meal.      . insulin aspart (NOVOLOG) 100 UNIT/ML injection Inject 0-8 Units into the skin daily as needed for high blood sugar.      . insulin glargine (LANTUS) 100 UNIT/ML injection Inject 80-90 Units into the skin at bedtime.       Marland Kitchen lisinopril (PRINIVIL,ZESTRIL) 20 MG tablet Take 1 tablet (20 mg total) by mouth daily.  90 tablet  3  . magnesium oxide (MAG-OX) 400 MG tablet Take 400 mg by mouth daily.      . metformin (FORTAMET) 1000 MG (OSM) 24 hr tablet Take 1,000 mg by mouth 2 (two) times daily.      . nitrofurantoin (MACRODANTIN) 50 MG capsule Take 50 mg by mouth at bedtime.      . pantoprazole (PROTONIX) 40 MG tablet Take 1 tablet (40 mg total) by mouth daily.  30 tablet  5  . pravastatin (PRAVACHOL) 40 MG  tablet Take 40 mg by mouth at bedtime.        No current facility-administered medications for this visit.    Allergies:   Review of patient's allergies indicates not on file.   Social History:  The patient  reports that she has been smoking Cigarettes.  She has been smoking about 0.50 packs per day. She has never used smokeless tobacco. She reports that she does not drink alcohol or use illicit drugs.   Family History:  The patient's family history includes Cancer in her mother; Diabetes in her brother and father; Heart disease in her brother and father; Hyperlipidemia in her brother and father; Hypertension in her brother and father; Leukemia in her sister.   ROS:  Please see the history of present illness.      All other systems reviewed and negative.   PHYSICAL EXAM: VS:  BP 150/70  Pulse 75  Ht 5\' 2"  (1.575 m)  Wt 174 lb (78.926 kg)  BMI 31.82 kg/m2 Well nourished, well developed, in no acute distress HEENT: normal Neck: no JVD Cardiac:  normal S1, S2; RRR; no murmur Lungs:  clear to auscultation bilaterally, no wheezing, rhonchi or rales Abd: soft, nontender, no hepatomegaly Ext: trace bilateral ankle edema;  Right groin without pulsatile mass or tenderness Vascular: Bilateral femoral artery bruits noted Skin: warm and dry Neuro:  CNs 2-12 intact, no focal abnormalities noted  EKG:  NSR, HR 75, normal axis, nonspecific ST-T wave changes     ASSESSMENT AND PLAN:  1. CAD:  She is doing well after recent PCI with DES to the ungrafted circumflex.  We discussed the importance of dual antiplatelet therapy. She is not interested in cardiac rehabilitation. She will continue to increase activity on her own. Continue beta blocker, statin, aspirin, Plavix. 2. Dyspnea: I suspect that this is multifactorial and related to deconditioning, obesity and coronary artery disease. Question if she has an element of heart failure with preserved ejection fraction (HFpEF).  Her blood pressure  remains elevated. I will add low-dose thiazide diuretic with HCTZ 12.5 mg daily. I have encouraged her to continue to increase her activity. 3. Claudication: She has bilateral leg pain that sounds consistent with intermittent claudication. She had normal ABIs prior to her bypass. She does have  bilateral femoral artery bruits. No arterial insufficiency ulcers on exam.  Her DP and PT are diminished bilaterally.  I will arrange ABIs with arterial Dopplers.  Continue walking program. 4. Hypertension:  Uncontrolled. Add HCTZ as noted. Check a basic metabolic panel in one week. 5. Hyperlipidemia:  Continue statin. 6. Diabetes Mellitus:  Follow up with primary care. 7. Disposition:  Follow up with Dr. Burt Knack in 6-8 weeks to  Signed, Richardson Dopp, PA-C  10/15/2013 11:40 AM

## 2013-10-18 ENCOUNTER — Other Ambulatory Visit: Payer: Self-pay | Admitting: *Deleted

## 2013-10-18 ENCOUNTER — Other Ambulatory Visit (HOSPITAL_COMMUNITY): Payer: Self-pay | Admitting: Cardiology

## 2013-10-18 ENCOUNTER — Encounter: Payer: Self-pay | Admitting: Cardiology

## 2013-10-18 ENCOUNTER — Ambulatory Visit (HOSPITAL_COMMUNITY): Payer: Medicare Other | Attending: Cardiology

## 2013-10-18 DIAGNOSIS — I2510 Atherosclerotic heart disease of native coronary artery without angina pectoris: Secondary | ICD-10-CM

## 2013-10-18 DIAGNOSIS — I739 Peripheral vascular disease, unspecified: Secondary | ICD-10-CM

## 2013-10-18 DIAGNOSIS — I70219 Atherosclerosis of native arteries of extremities with intermittent claudication, unspecified extremity: Secondary | ICD-10-CM

## 2013-10-18 DIAGNOSIS — I10 Essential (primary) hypertension: Secondary | ICD-10-CM | POA: Insufficient documentation

## 2013-10-18 DIAGNOSIS — E785 Hyperlipidemia, unspecified: Secondary | ICD-10-CM | POA: Insufficient documentation

## 2013-10-18 DIAGNOSIS — E119 Type 2 diabetes mellitus without complications: Secondary | ICD-10-CM | POA: Insufficient documentation

## 2013-10-18 DIAGNOSIS — Z951 Presence of aortocoronary bypass graft: Secondary | ICD-10-CM

## 2013-10-18 NOTE — Telephone Encounter (Signed)
Refills requested through [facility] - faxed to Dr. Burt Knack for authorization.  Patient has never been seen at [facility].

## 2013-10-25 ENCOUNTER — Telehealth: Payer: Self-pay | Admitting: *Deleted

## 2013-10-25 NOTE — Telephone Encounter (Signed)
lmom LEA normal and to f/u w/PCP about leg pain

## 2013-10-27 ENCOUNTER — Other Ambulatory Visit: Payer: Self-pay

## 2013-10-27 MED ORDER — PANTOPRAZOLE SODIUM 40 MG PO TBEC: 40.0000 mg | DELAYED_RELEASE_TABLET | Freq: Every day | ORAL | Status: DC

## 2013-10-27 MED ORDER — AMLODIPINE BESYLATE 10 MG PO TABS: 10.0000 mg | ORAL_TABLET | Freq: Every day | ORAL | Status: DC

## 2013-10-27 MED ORDER — CLOPIDOGREL BISULFATE 75 MG PO TABS: 75.0000 mg | ORAL_TABLET | Freq: Every evening | ORAL | Status: DC

## 2013-10-29 ENCOUNTER — Telehealth: Payer: Self-pay | Admitting: Cardiovascular Disease

## 2013-10-29 NOTE — Telephone Encounter (Signed)
New Message:  Pt is requesting she be set up to have cardiac rehab. Pt would like a call back from then nurse

## 2013-10-29 NOTE — Telephone Encounter (Signed)
Returned call to patient she stated she is ready to set up cardiac rehab.

## 2013-10-29 NOTE — Telephone Encounter (Signed)
Spoke to [place] at Cardiac Rehab she will fax Dr.Cooper cardiac rehab form to sign.

## 2013-11-03 NOTE — Telephone Encounter (Signed)
Signed order placed in HIM for faxing

## 2013-11-11 ENCOUNTER — Encounter (HOSPITAL_COMMUNITY)
Admission: RE | Admit: 2013-11-11 | Discharge: 2013-11-11 | Disposition: A | Discharge status: Home / Self Care | Payer: Medicare Other | Source: Ambulatory Visit | Attending: Cardiovascular Disease | Admitting: Cardiovascular Disease

## 2013-11-11 DIAGNOSIS — Z9861 Coronary angioplasty status: Secondary | ICD-10-CM | POA: Insufficient documentation

## 2013-11-11 DIAGNOSIS — Z5189 Encounter for other specified aftercare: Secondary | ICD-10-CM | POA: Insufficient documentation

## 2013-11-11 DIAGNOSIS — Z951 Presence of aortocoronary bypass graft: Secondary | ICD-10-CM | POA: Insufficient documentation

## 2013-11-11 NOTE — Progress Notes (Signed)
Cardiac Rehab Medication Review by a Pharmacist  Does the patient  feel that his/her medications are working for him/her?  yes  Has the patient been experiencing any side effects to the medications prescribed?  no  Does the patient measure his/her own blood pressure or blood glucose at home?  yes   Does the patient have any problems obtaining medications due to transportation or finances?   no  Understanding of regimen: good Understanding of indications: good Potential of compliance: good    Pharmacist comments: Patient's blood pressure has been high in the middle of the day, stating that she feels flushed and her heart racing.  She said it has improved with increase atenolol and lisinopril.  She has had several low blood sugars, patient states they go as low as the 30s.  We talked about appropriate management of lows and not taking a nap until she has rechecked and sugars have normalized.  Explained the dangers of napping with lows.  She states she understands.    Thank you, Vivia Ewing, PharmD Clinical Pharmacist - Resident Pager: [phone] Pharmacy: [phone] 11/11/2013 9:28 AM

## 2013-11-17 ENCOUNTER — Encounter (HOSPITAL_COMMUNITY)
Admission: RE | Admit: 2013-11-17 | Discharge: 2013-11-17 | Disposition: A | Discharge status: Home / Self Care | Payer: Medicare Other | Source: Ambulatory Visit | Attending: Cardiovascular Disease | Admitting: Cardiovascular Disease

## 2013-11-17 LAB — GLUCOSE, CAPILLARY
Glucose-Capillary: 151 mg/dL — ABNORMAL HIGH (ref 70–99)
Glucose-Capillary: 131 mg/dL — ABNORMAL HIGH (ref 70–99)

## 2013-11-17 NOTE — Progress Notes (Signed)
Pt started cardiac rehab today.  Pt tolerated light exercise without difficulty. Telemetry rhythm Sinus. Vital signs stable.  Exit blood pressure 138/62.Will continue to monitor the patient throughout  the program. Darlene Berry has a $50.00 co pay and will only be attending 4 sessions of cardiac rehab.

## 2013-11-19 ENCOUNTER — Encounter (HOSPITAL_COMMUNITY)
Admission: RE | Admit: 2013-11-19 | Discharge: 2013-11-19 | Disposition: A | Discharge status: Home / Self Care | Payer: Medicare Other | Source: Ambulatory Visit | Attending: Cardiovascular Disease | Admitting: Cardiovascular Disease

## 2013-11-19 NOTE — Progress Notes (Signed)
Patient called to report that she will not be returning to cardiac rehab due to her high insurance copay.

## 2013-11-22 ENCOUNTER — Other Ambulatory Visit: Payer: Self-pay

## 2013-11-22 MED ORDER — HYDROCHLOROTHIAZIDE 12.5 MG PO CAPS: 12.5000 mg | ORAL_CAPSULE | Freq: Every day | ORAL | Status: DC

## 2013-11-24 ENCOUNTER — Encounter (HOSPITAL_COMMUNITY): Payer: Medicare Other

## 2013-11-26 ENCOUNTER — Encounter (HOSPITAL_COMMUNITY): Payer: Medicare Other

## 2013-12-01 ENCOUNTER — Encounter (HOSPITAL_COMMUNITY): Payer: Medicare Other

## 2013-12-03 ENCOUNTER — Encounter (HOSPITAL_COMMUNITY): Payer: Medicare Other

## 2013-12-06 ENCOUNTER — Encounter: Payer: Self-pay | Admitting: Cardiovascular Disease

## 2013-12-06 ENCOUNTER — Ambulatory Visit (INDEPENDENT_AMBULATORY_CARE_PROVIDER_SITE_OTHER): Payer: Medicare Other | Admitting: Cardiovascular Disease

## 2013-12-06 VITALS — BP 110/60 | HR 70 | Ht 62.0 in | Wt 170.0 lb

## 2013-12-06 DIAGNOSIS — M791 Myalgia, unspecified site: Secondary | ICD-10-CM

## 2013-12-06 DIAGNOSIS — E785 Hyperlipidemia, unspecified: Secondary | ICD-10-CM

## 2013-12-06 DIAGNOSIS — I2510 Atherosclerotic heart disease of native coronary artery without angina pectoris: Secondary | ICD-10-CM

## 2013-12-06 DIAGNOSIS — IMO0001 Reserved for inherently not codable concepts without codable children: Secondary | ICD-10-CM

## 2013-12-06 MED ORDER — COENZYME Q10 200 MG PO TABS: 200.0000 mg | ORAL_TABLET | Freq: Every day | ORAL | Status: DC

## 2013-12-06 NOTE — Progress Notes (Signed)
HPI:  63 year old woman presenting for followup evaluation. The patient is followed for coronary artery disease status post CABG in 2014. She also has diabetes, hypertension, hyperlipidemia, and carotid stenosis status post left carotid endarterectomy. In February 2015 she presented with progressive symptoms of angina. She underwent cardiac catheterization demonstrating patency of her bypass grafts. There was severe stenosis of a nongrafted left circumflex and she was treated with a drug-eluting stent. She saw Richardson Dopp back in followup after PCI and hydrochlorothiazide was added for better blood pressure control.  Overall her symptoms have improved since PCI. She continues to complain of exertional discomfort in her 'heart.' She points to an area over her left chest and states that it feels like a 'sharp pain' in the heart. She no longer has chest pressure or heaviness. Denies dyspnea, edema, or palpitations. Notes BP is improved. She's concerned about her prognosis from a cardiac perspective.   Outpatient Encounter Prescriptions as of 12/06/2013  Medication Sig  . ALPRAZolam (XANAX) 0.5 MG tablet Take 0.5 mg by mouth at bedtime as needed for sleep.  Marland Kitchen amLODipine (NORVASC) 10 MG tablet Take 1 tablet (10 mg total) by mouth daily.  Marland Kitchen aspirin 325 MG tablet Take 325 mg by mouth daily.  Marland Kitchen atenolol (TENORMIN) 25 MG tablet Take 25 mg by mouth 2 (two) times daily.   Marland Kitchen buPROPion (WELLBUTRIN SR) 150 MG 12 hr tablet Take 150 mg by mouth 2 (two) times daily.  . clopidogrel (PLAVIX) 75 MG tablet Take 1 tablet (75 mg total) by mouth Nightly.  . fenofibrate 160 MG tablet Take 160 mg by mouth at bedtime.   Marland Kitchen glipiZIDE (GLUCOTROL) 10 MG tablet Take 10 mg by mouth 2 (two) times daily before a meal.  . hydrochlorothiazide (MICROZIDE) 12.5 MG capsule Take 1 capsule (12.5 mg total) by mouth daily.  . insulin aspart (NOVOLOG) 100 UNIT/ML injection Inject 0-8 Units into the skin daily as needed for high blood  sugar.  . insulin glargine (LANTUS) 100 UNIT/ML injection Inject 60-70 Units into the skin at bedtime.   Marland Kitchen lisinopril (PRINIVIL,ZESTRIL) 20 MG tablet Take 1 tablet (20 mg total) by mouth daily.  . magnesium oxide (MAG-OX) 400 MG tablet Take 400 mg by mouth daily.  . metformin (FORTAMET) 1000 MG (OSM) 24 hr tablet Take 1,000 mg by mouth 2 (two) times daily.  . Multiple Vitamins-Minerals (WOMENS 50+ MULTI VITAMIN/MIN PO) Take by mouth.  . nitrofurantoin (MACRODANTIN) 50 MG capsule Take 50 mg by mouth at bedtime.  . pantoprazole (PROTONIX) 40 MG tablet Take 1 tablet (40 mg total) by mouth daily.  . potassium phosphate, monobasic, (K-PHOS ORIGINAL) 500 MG tablet Take 325 mg by mouth daily.  . pravastatin (PRAVACHOL) 40 MG tablet Take 40 mg by mouth at bedtime.     Not on File  Past Medical History  Diagnosis Date  . Endometriosis   . Carotid artery stenosis   . CAD (coronary artery disease)   . Hyperlipemia   . Hypertension   . Hx of transfusion   . Anxiety   . Arthritis   . GERD (gastroesophageal reflux disease)     "I have lots of acid"   . Cancer     basal cell - nose   . Anemia     after tubal preg. - rec'd bld. transfusion    . Sleep apnea     uses CPAP q night - assessment, 2011- in [facility]   . 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  LIMA to LAD, SVG to RCA, EVH via right thigh  . Anginal pain   . Shortness of breath   . Diabetes mellitus     diagnosed - 1999    ROS: Negative except as per HPI  BP 110/60  Pulse 70  Ht 5\' 2"  (1.575 m)  Wt 170 lb (77.111 kg)  BMI 31.09 kg/m2  PHYSICAL EXAM: Pt is alert and oriented, NAD HEENT: normal Neck: JVP - normal, carotids 2+= without bruits Lungs: CTA bilaterally CV: RRR without murmur or gallop Abd: soft, NT, Positive BS, no hepatomegaly Ext: no C/C/E, distal pulses intact and equal Skin: warm/dry no rash  ASSESSMENT AND PLAN: 1. CAD, native vessel. Recent cath findings reviewed in detail with patient. Her pain is exertional  but has atypical features, unlike previous angina. Advised continue current medical program (meds reviewed - antiplatelets include ASA and plavix and antianginals include amlodipine and atenolol).   2. Hyperlipidemia. Continue pravastatin. Leg myalgias noted. Advised CoQ10 200 mg daily.  3. HTN - BP controlled on multidrug Rx.  Reviewed overall prognosis as this is her primary concern. With recent CABG and patent grafts, preserved LV function, prognosis is good pending continued med adherence and treatment of HTN/diabetes, etc.  [name] 12/06/2013 3:55 PM

## 2013-12-06 NOTE — Patient Instructions (Addendum)
Your physician has recommended you make the following change in your medication: START Coenzyme Q10 200mg  take one by mouth daily (over the counter)  Your physician wants you to follow-up in: 6 MONTHS with Dr Burt Knack.  You will receive a reminder letter in the mail two months in advance. If you don't receive a letter, please call our office to schedule the follow-up appointment.

## 2013-12-08 ENCOUNTER — Encounter (HOSPITAL_COMMUNITY): Payer: Medicare Other

## 2013-12-08 ENCOUNTER — Encounter: Payer: Self-pay | Admitting: Cardiovascular Disease

## 2013-12-10 ENCOUNTER — Encounter (HOSPITAL_COMMUNITY): Payer: Medicare Other

## 2013-12-15 ENCOUNTER — Encounter (HOSPITAL_COMMUNITY): Payer: Medicare Other

## 2013-12-17 ENCOUNTER — Encounter (HOSPITAL_COMMUNITY): Payer: Medicare Other

## 2013-12-22 ENCOUNTER — Encounter (HOSPITAL_COMMUNITY): Payer: Medicare Other

## 2013-12-24 ENCOUNTER — Encounter (HOSPITAL_COMMUNITY): Payer: Medicare Other

## 2013-12-29 ENCOUNTER — Encounter (HOSPITAL_COMMUNITY): Payer: Medicare Other

## 2013-12-31 ENCOUNTER — Encounter (HOSPITAL_COMMUNITY): Payer: Medicare Other

## 2014-01-05 ENCOUNTER — Encounter (HOSPITAL_COMMUNITY): Payer: Medicare Other

## 2014-01-07 ENCOUNTER — Encounter (HOSPITAL_COMMUNITY): Payer: Medicare Other

## 2014-01-12 ENCOUNTER — Encounter (HOSPITAL_COMMUNITY): Payer: Medicare Other

## 2014-01-14 ENCOUNTER — Encounter (HOSPITAL_COMMUNITY): Payer: Medicare Other

## 2014-01-19 ENCOUNTER — Encounter (HOSPITAL_COMMUNITY): Payer: Medicare Other

## 2014-01-21 ENCOUNTER — Encounter (HOSPITAL_COMMUNITY): Payer: Medicare Other

## 2014-01-26 ENCOUNTER — Encounter (HOSPITAL_COMMUNITY): Payer: Medicare Other

## 2014-01-28 ENCOUNTER — Encounter (HOSPITAL_COMMUNITY): Payer: Medicare Other

## 2014-01-31 ENCOUNTER — Telehealth: Payer: Self-pay | Admitting: Cardiovascular Disease

## 2014-01-31 MED ORDER — NITROGLYCERIN 0.4 MG SL SUBL
0.4000 mg | SUBLINGUAL_TABLET | SUBLINGUAL | Status: DC | PRN
Start: 2014-01-31 — End: 2021-05-09

## 2014-01-31 NOTE — Telephone Encounter (Signed)
New message     Patient calling C/O having some chest pain. Wanted a prescription for nitro . Then the patient hung up the phone stating she will call back.

## 2014-01-31 NOTE — Telephone Encounter (Signed)
Pt called because she sometime work hard and gets Chest pain sometime she has taken the SL, NTG prn prescribed by another doctor and she  is out of refills. A prescription sent to pt's pharmacy. Pt aware.

## 2014-02-02 ENCOUNTER — Encounter (HOSPITAL_COMMUNITY): Payer: Medicare Other

## 2014-02-04 ENCOUNTER — Encounter (HOSPITAL_COMMUNITY): Payer: Medicare Other

## 2014-02-09 ENCOUNTER — Encounter (HOSPITAL_COMMUNITY): Payer: Medicare Other

## 2014-02-11 ENCOUNTER — Encounter (HOSPITAL_COMMUNITY): Payer: Medicare Other

## 2014-02-16 ENCOUNTER — Encounter (HOSPITAL_COMMUNITY): Payer: Medicare Other

## 2014-02-18 ENCOUNTER — Encounter (HOSPITAL_COMMUNITY): Payer: Medicare Other

## 2014-02-23 ENCOUNTER — Encounter (HOSPITAL_COMMUNITY): Payer: Medicare Other

## 2014-02-25 ENCOUNTER — Encounter (HOSPITAL_COMMUNITY): Payer: Medicare Other

## 2014-03-02 ENCOUNTER — Encounter (HOSPITAL_COMMUNITY): Payer: Medicare Other

## 2014-03-04 ENCOUNTER — Encounter (HOSPITAL_COMMUNITY): Payer: Medicare Other

## 2014-03-09 ENCOUNTER — Encounter (HOSPITAL_COMMUNITY): Payer: Medicare Other

## 2014-03-11 ENCOUNTER — Encounter (HOSPITAL_COMMUNITY): Payer: Medicare Other

## 2014-03-16 ENCOUNTER — Encounter (HOSPITAL_COMMUNITY): Payer: Medicare Other

## 2014-03-18 ENCOUNTER — Encounter (HOSPITAL_COMMUNITY): Payer: Medicare Other

## 2014-05-11 ENCOUNTER — Other Ambulatory Visit: Payer: Self-pay | Admitting: Cardiology

## 2014-05-30 ENCOUNTER — Encounter: Payer: Self-pay | Admitting: Cardiovascular Disease

## 2014-05-31 ENCOUNTER — Other Ambulatory Visit: Payer: Self-pay | Admitting: Cardiology

## 2014-05-31 ENCOUNTER — Other Ambulatory Visit: Payer: Self-pay | Admitting: Cardiovascular Disease

## 2014-06-23 ENCOUNTER — Ambulatory Visit: Payer: Medicare Other | Admitting: Cardiovascular Disease

## 2014-07-28 ENCOUNTER — Encounter: Payer: Self-pay | Admitting: Cardiovascular Disease

## 2014-07-28 ENCOUNTER — Ambulatory Visit (INDEPENDENT_AMBULATORY_CARE_PROVIDER_SITE_OTHER): Payer: Medicare Other | Admitting: Cardiovascular Disease

## 2014-07-28 VITALS — BP 120/68 | HR 77 | Ht 62.0 in | Wt 164.8 lb

## 2014-07-28 DIAGNOSIS — I2510 Atherosclerotic heart disease of native coronary artery without angina pectoris: Secondary | ICD-10-CM

## 2014-07-28 DIAGNOSIS — R0989 Other specified symptoms and signs involving the circulatory and respiratory systems: Secondary | ICD-10-CM

## 2014-07-28 NOTE — Patient Instructions (Signed)
Your physician has requested that you have a carotid duplex. This test is an ultrasound of the carotid arteries in your neck. It looks at blood flow through these arteries that supply the brain with blood. Allow one hour for this exam. There are no restrictions or special instructions.  Your physician recommends that you continue on your current medications as directed. Please refer to the Current Medication list given to you today.  Your physician wants you to follow-up in: 6 MONTHS with Dr Burt Knack.  You will receive a reminder letter in the mail two months in advance. If you don't receive a letter, please call our office to schedule the follow-up appointment.  You are okay to schedule Endoscopy in February 2016 or after, at this time you can hold plavix 5-7 days prior to procedure

## 2014-07-28 NOTE — Progress Notes (Signed)
Background: The patient is followed for coronary artery disease status post CABG in 2014. She also has diabetes, hypertension, hyperlipidemia, and carotid stenosis status post left carotid endarterectomy. In February 2015 she presented with progressive symptoms of angina. She underwent cardiac catheterization demonstrating patency of her bypass grafts. There was severe stenosis of a nongrafted left circumflex and she was treated with a drug-eluting stent  HPI:  63 year old woman presenting for follow-up evaluation. She was last seen in April 2015. The patient is doing well. She has an occasional twinge in her chest but no substernal pain or pressure. She has no symptoms associated with physical exertion. She gets out and works in the yard during the summer and fall, but has a tendency to be very sedentary over the winter months. She specifically denies chest pressure, shortness of breath, edema, or heart palpitations. She is tolerating her medications well. She admits to easy bruising on dual antiplatelet therapy, but no major bleeding problems reported. She's compliant with her medications.  Outpatient Encounter Prescriptions as of 07/28/2014  Medication Sig  . ALPRAZolam (XANAX) 0.5 MG tablet Take 0.5 mg by mouth at bedtime as needed for sleep.  Marland Kitchen amLODipine (NORVASC) 10 MG tablet Take 1 tablet by mouth  daily  . aspirin 325 MG tablet Take 325 mg by mouth daily.  Marland Kitchen atenolol (TENORMIN) 25 MG tablet Take 25 mg by mouth 2 (two) times daily.   . clopidogrel (PLAVIX) 75 MG tablet Take 1 tablet by mouth  nightly  . Coenzyme Q10 200 MG TABS Take 1 tablet (200 mg total) by mouth daily.  . DULoxetine (CYMBALTA) 30 MG capsule   . fenofibrate 160 MG tablet Take 160 mg by mouth at bedtime.   . ferrous fumarate (HEMOCYTE - 106 MG FE) 325 (106 FE) MG TABS tablet Take 1 tablet by mouth daily.  Marland Kitchen glipiZIDE (GLUCOTROL) 10 MG tablet Take 10 mg by mouth 2 (two) times daily before a meal.  . hydrochlorothiazide  (MICROZIDE) 12.5 MG capsule Take 1 capsule by mouth  daily  . insulin aspart (NOVOLOG) 100 UNIT/ML injection Inject 0-8 Units into the skin daily as needed for high blood sugar.  . insulin glargine (LANTUS) 100 UNIT/ML injection Inject 60-70 Units into the skin at bedtime.   Marland Kitchen lisinopril (PRINIVIL,ZESTRIL) 20 MG tablet Take 1 tablet (20 mg total) by mouth daily.  . magnesium oxide (MAG-OX) 400 MG tablet Take 400 mg by mouth daily.  . metformin (FORTAMET) 1000 MG (OSM) 24 hr tablet Take 1,000 mg by mouth 2 (two) times daily.  . metFORMIN (GLUCOPHAGE) 1000 MG tablet   . Multiple Vitamins-Minerals (WOMENS 50+ MULTI VITAMIN/MIN PO) Take by mouth.  . nitrofurantoin (MACRODANTIN) 50 MG capsule Take 50 mg by mouth at bedtime.  . nitroGLYCERIN (NITROSTAT) 0.4 MG SL tablet Place 1 tablet (0.4 mg total) under the tongue every 5 (five) minutes as needed for chest pain.  . pantoprazole (PROTONIX) 40 MG tablet Take 1 tablet by mouth  daily  . potassium phosphate, monobasic, (K-PHOS ORIGINAL) 500 MG tablet Take 325 mg by mouth daily.  . pravastatin (PRAVACHOL) 40 MG tablet Take 40 mg by mouth at bedtime.   . [DISCONTINUED] buPROPion (WELLBUTRIN SR) 150 MG 12 hr tablet Take 150 mg by mouth 2 (two) times daily.    Not on File  Past Medical History  Diagnosis Date  . Endometriosis   . Carotid artery stenosis   . CAD (coronary artery disease)   . Hyperlipemia   . Hypertension   .  Hx of transfusion   . Anxiety   . Arthritis   . GERD (gastroesophageal reflux disease)     "I have lots of acid"   . Cancer     basal cell - nose   . Anemia     after tubal preg. - rec'd bld. transfusion    . Sleep apnea     uses CPAP q night - assessment, 2011- in [facility]   . 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    LIMA to LAD, SVG to RCA, EVH via right thigh  . Anginal pain   . Shortness of breath   . Diabetes mellitus     diagnosed - 1999    family history includes Cancer in her mother; Diabetes in her brother and  father; Heart disease in her brother and father; Hyperlipidemia in her brother and father; Hypertension in her brother and father; Leukemia in her sister.   ROS: Negative except as per HPI  BP 120/68 mmHg  Pulse 77  Ht 5\' 2"  (1.575 m)  Wt 164 lb 12.8 oz (74.753 kg)  BMI 30.13 kg/m2  PHYSICAL EXAM: Pt is alert and oriented, NAD HEENT: normal Neck: JVP - normal, carotids 2+= with right greater than left carotid bruits Lungs: CTA bilaterally CV: RRR without murmur or gallop Abd: soft, NT, Positive BS, no hepatomegaly Ext: no C/C/E, distal pulses intact and equal Skin: warm/dry no rash  EKG:  Normal sinus rhythm 77 bpm, nonspecific ST and T-wave abnormality.  ASSESSMENT AND PLAN: 1. Coronary artery disease, native vessel, without symptoms of angina. The patient is doing well on her current medical program which will be continued. She has CCS class I symptoms. I advised her to try to increase her walking program and aim for regular exercise. I will see her back in 6 months.  2. Hyperlipidemia: The patient is on a statin drug. She is now followed by her primary care physician.  3. Essential hypertension: Blood pressure is well controlled on her current medical program.  4. Right carotid bruit. Previously noted to have nonobstructive carotid stenosis prior to CABG. Last study on file is from June 2014. I've suggested a repeat duplex scan.  The patient will return for follow-up in 6 months. She is due for an endoscopic evaluation. Her PCI procedure was in February 2015. I advised her it would be best to schedule anytime after February 1st of next year. She can hold Plavix 5-7 days before the procedure if necessary.  [name], MD 07/28/2014 10:39 AM

## 2014-08-01 ENCOUNTER — Telehealth: Payer: Self-pay | Admitting: Cardiovascular Disease

## 2014-08-01 NOTE — Telephone Encounter (Signed)
FYI!   Called patient to inform her that we had changed her appointment time due to scheduling error for her Carotid. She told me that she would call back to schedule after the holidays because she has to much going on right now.  Darlene Berry

## 2014-08-01 NOTE — Telephone Encounter (Signed)
Okay 

## 2014-08-04 ENCOUNTER — Encounter (HOSPITAL_COMMUNITY): Payer: Self-pay | Admitting: Cardiovascular Disease

## 2014-08-10 ENCOUNTER — Encounter (HOSPITAL_COMMUNITY): Payer: Medicare Other

## 2014-09-08 ENCOUNTER — Ambulatory Visit (HOSPITAL_COMMUNITY): Payer: Medicare Other | Attending: Cardiovascular Disease | Admitting: Cardiology

## 2014-09-08 DIAGNOSIS — E785 Hyperlipidemia, unspecified: Secondary | ICD-10-CM | POA: Diagnosis not present

## 2014-09-08 DIAGNOSIS — I2510 Atherosclerotic heart disease of native coronary artery without angina pectoris: Secondary | ICD-10-CM | POA: Diagnosis not present

## 2014-09-08 DIAGNOSIS — Z951 Presence of aortocoronary bypass graft: Secondary | ICD-10-CM | POA: Diagnosis not present

## 2014-09-08 DIAGNOSIS — I10 Essential (primary) hypertension: Secondary | ICD-10-CM | POA: Insufficient documentation

## 2014-09-08 DIAGNOSIS — E119 Type 2 diabetes mellitus without complications: Secondary | ICD-10-CM | POA: Insufficient documentation

## 2014-09-08 DIAGNOSIS — I6523 Occlusion and stenosis of bilateral carotid arteries: Secondary | ICD-10-CM

## 2014-09-08 DIAGNOSIS — R0989 Other specified symptoms and signs involving the circulatory and respiratory systems: Secondary | ICD-10-CM | POA: Diagnosis present

## 2014-09-08 NOTE — Progress Notes (Signed)
Carotid duplex performed 

## 2014-09-29 IMAGING — CR DG CHEST 1V PORT
1 series · 1 of 1 positions shown · non-contrast
Comparison: .  03/08/2013.

CLINICAL DATA: Post CABG appear

PORTABLE CHEST - 1 VIEW

[AP]
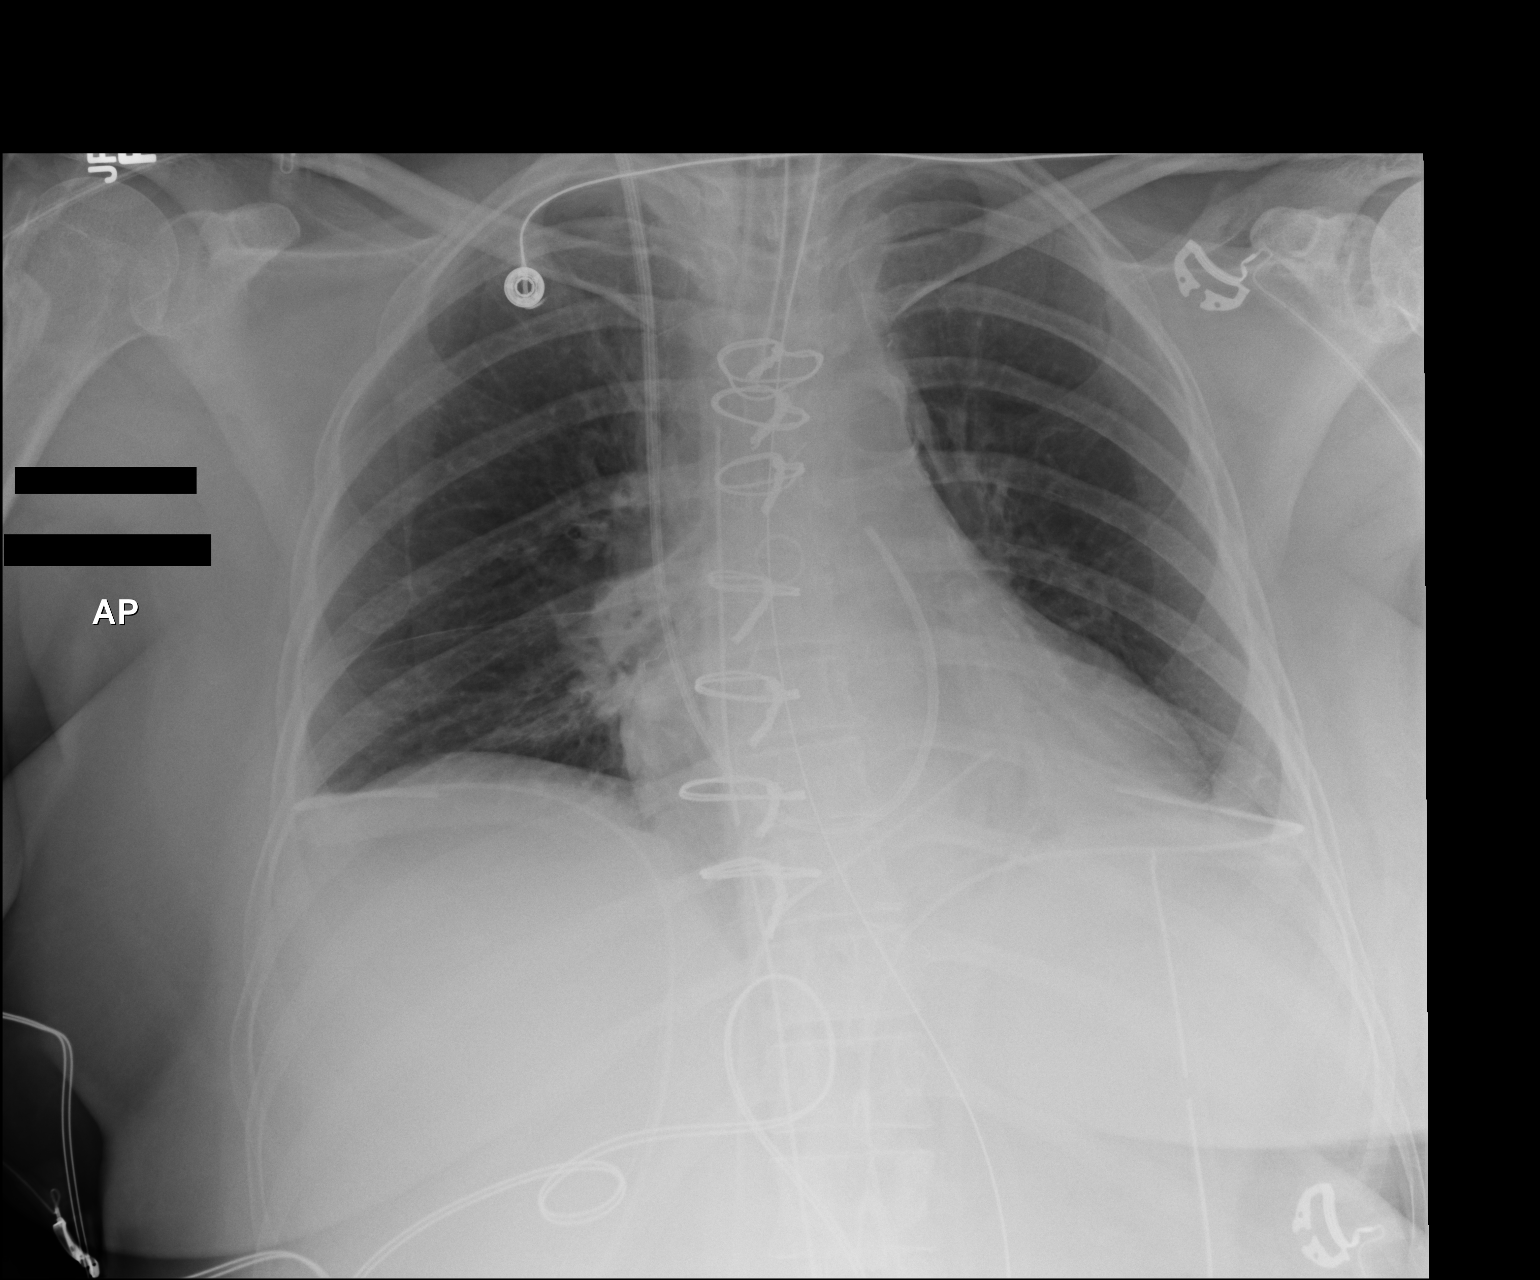

[1 of 1 positions shown; findings below may reference images not displayed]

FINDINGS: Endotracheal tube tip 3.4 cm above the carina.  Right
Swan-Ganz catheter tip main pulmonary artery level.

Bilateral chest tubes and mediastinal drain in place.  No gross
pneumothorax.  Basilar atelectasis.

Heart slightly enlarged.

Mild central pulmonary vascular prominence without pulmonary edema.

Nasogastric tube appears curled upon itself with the tip at the
level of the gastric fundus.

Calcified aorta.
IMPRESSION: Post CABG with support apparatus/lines appearing in the
satisfactory position as noted above.

## 2014-10-12 ENCOUNTER — Other Ambulatory Visit: Payer: Self-pay | Admitting: Cardiovascular Disease

## 2014-10-18 IMAGING — CR DG CHEST 2V
2 series · 2 of 2 positions shown · non-contrast
Comparison: March 13, 2013

CLINICAL DATA: Chest pain

CHEST - 2 VIEW

[view not recorded (1 of 2)]
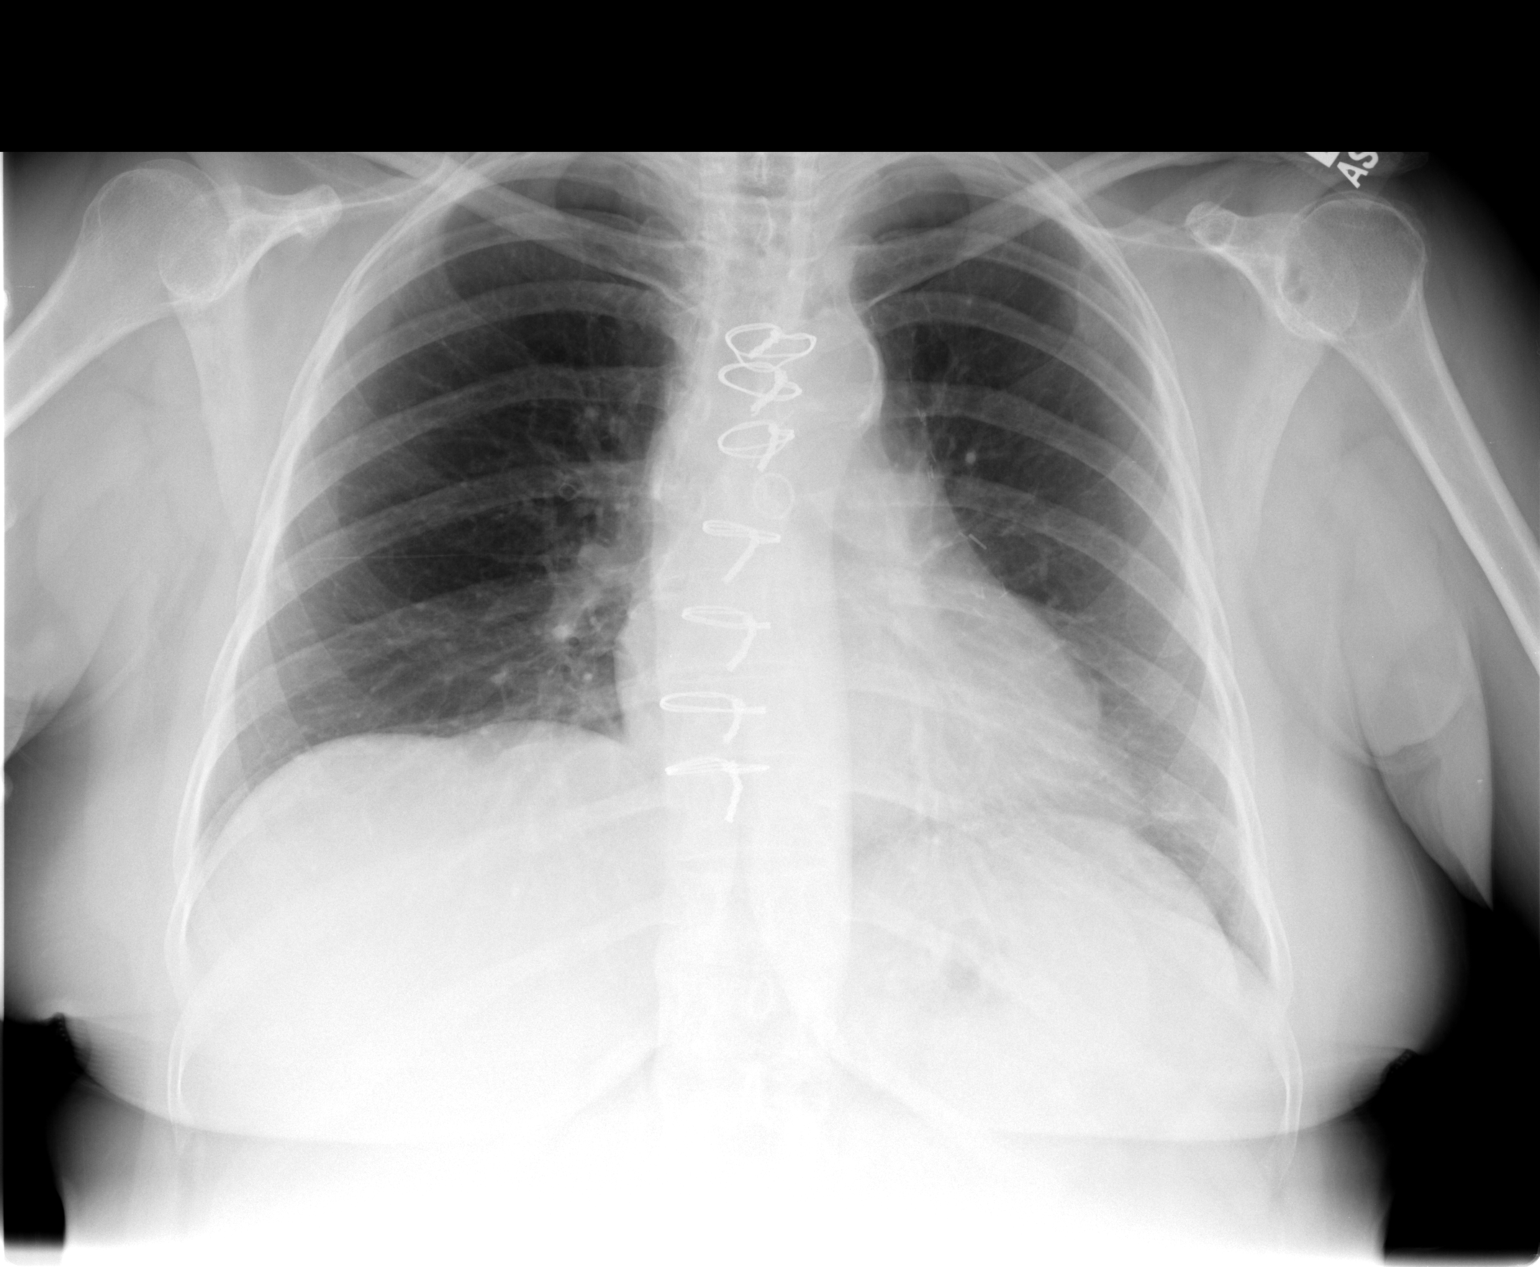

[view not recorded (2 of 2)]
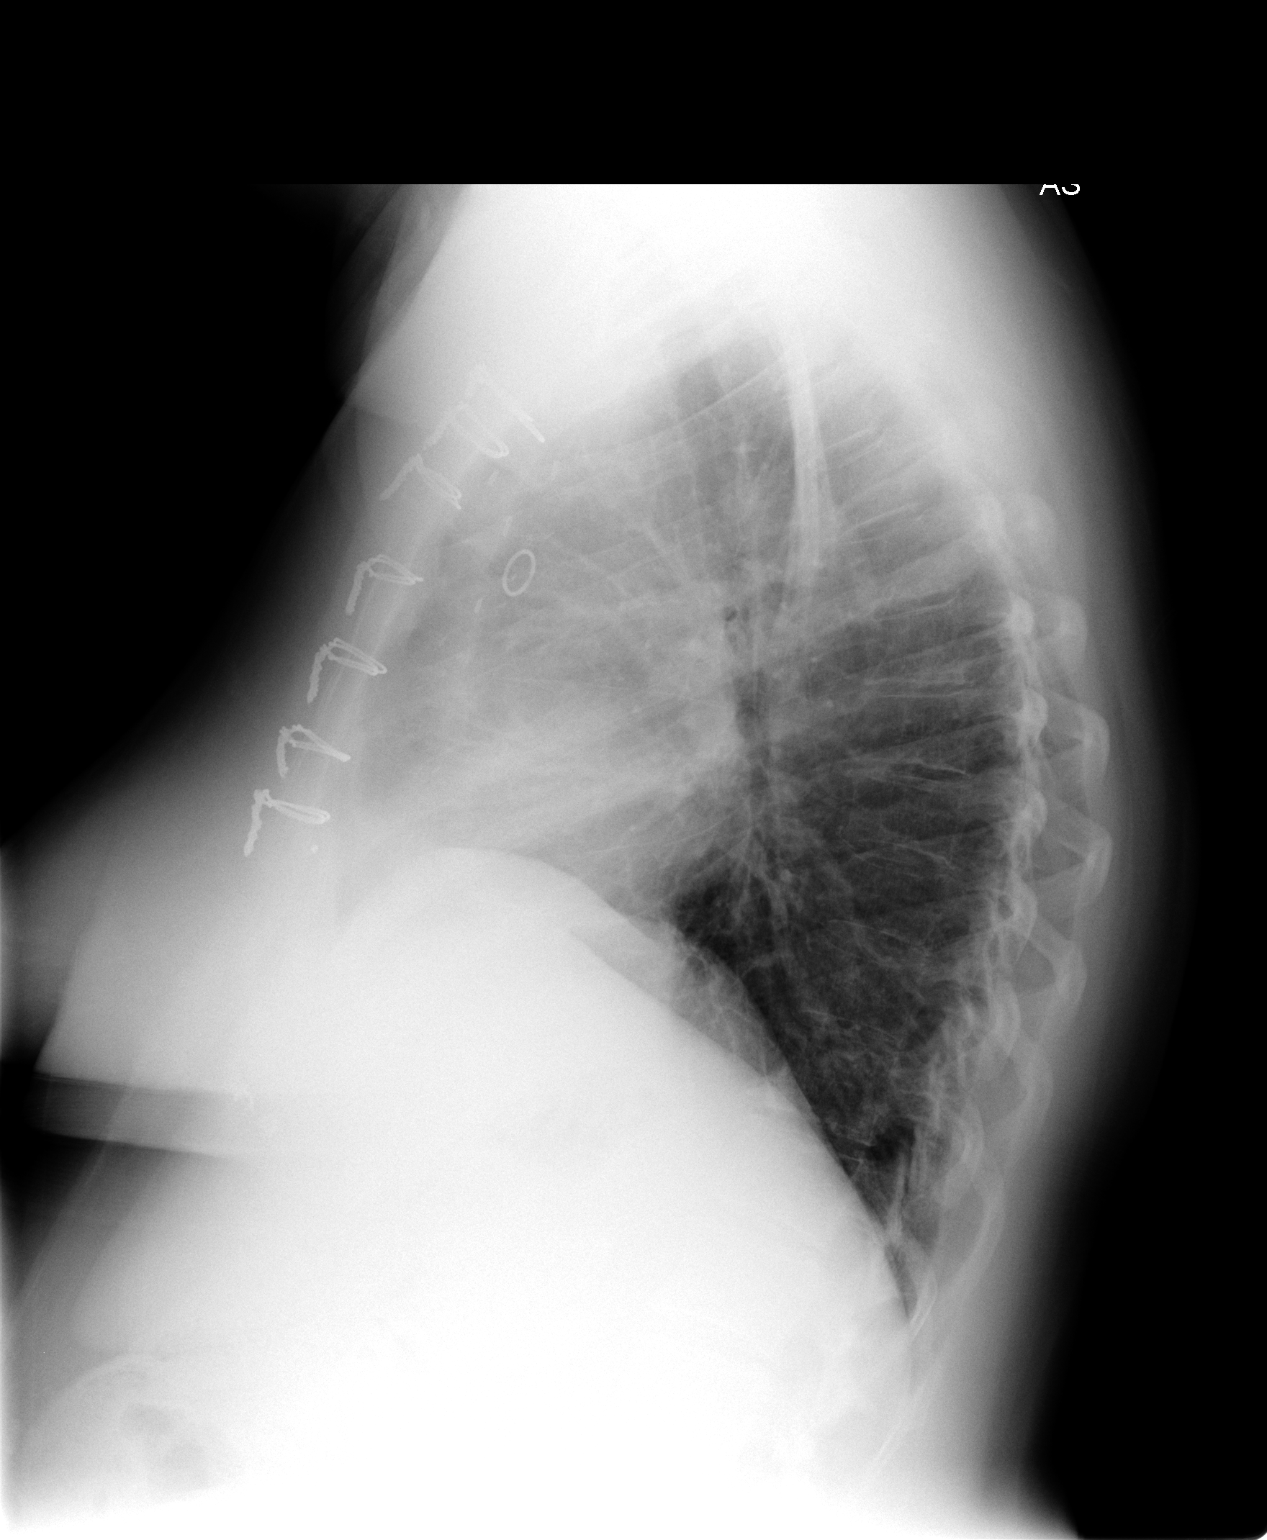

[2 of 2 positions shown; findings below may reference images not displayed]

FINDINGS: There is no focal infiltrate, pulmonary edema, or pleural
effusion.  There is chronic elevation of right hemidiaphragm.  The
patient status post prior CABG.  Heart size is normal.  The soft
tissues and osseous structures are stable.
IMPRESSION: No acute cardiopulmonary disease identified.

## 2014-12-23 ENCOUNTER — Other Ambulatory Visit: Payer: Self-pay | Admitting: Cardiovascular Disease

## 2014-12-23 NOTE — Telephone Encounter (Signed)
We cannot prescribe the pt's insulin. Okay to refill cardiac medications.

## 2015-02-24 ENCOUNTER — Telehealth: Payer: Self-pay | Admitting: Cardiovascular Disease

## 2015-02-24 ENCOUNTER — Encounter: Payer: Self-pay | Admitting: *Deleted

## 2015-02-24 NOTE — Telephone Encounter (Signed)
Letter of clearance faxed to Dr. Harl Bowie office Pt is aware she can hold the plavix & is cleared for her colonoscopy.  Horton Chin RN

## 2015-02-24 NOTE — Telephone Encounter (Signed)
This is fine. He can hold plavix x 5 days prior to endoscopy then resume when OK with Dr Harl Bowie after the procedure.   Darlene Berry 02/24/2015 2:19 PM

## 2015-02-24 NOTE — Telephone Encounter (Signed)
lmtcb Forwarded to Dr. Burt Knack & Otho Ket RN

## 2015-02-24 NOTE — Telephone Encounter (Signed)
New message     Request for surgical clearance:  1. What type of surgery is being performed? Colonoscopy  2. When is this surgery scheduled? July 12  3. Are there any medications that need to be held prior to surgery and how long?Plavix; 4 days   4. Name of physician performing surgery? Dr. Shirleen Schirmer  5. What is your office phone and fax number? Phone [phone]  Fax [phone]  Please advise

## 2015-05-10 NOTE — Progress Notes (Signed)
Cardiology Office Note   Date:  05/11/2015   ID:  [name], [name] Jul 22, 1951, MRN [id number]  PCP:  Candi Leash, PA-C  Cardiologist:  Dr. [name]   Electrophysiologist:  n/a  Chief Complaint  Patient presents with  . Coronary Artery Disease     History of Present Illness: Darlene Berry is a 64 y.o. female with a hx of CAD status post CABG in 2014, diabetes, HTN, HL, carotid stenosis status post left CEA. She underwent LHC in 2/15 for unstable angina. Bypass grafts were patent but she had severe stenosis of a nongrafted LCx which was treated with a DES. Last seen by Dr. Burt Knack 12/15.  Returns for follow-up. Here today by herself. She recently had some dizziness and saw her ENT. She had some wax removed from her ear and has not had symptoms since.  She continues to have chest symptoms with more extreme activities. However, over the last several months, she believes that her chest symptoms have become more frequent. She does not take nitroglycerin. She denies any rest symptoms. She does do a lot of yard work and does lift very heavy objects. She denies orthopnea, PND or edema. She notes shortness of breath with more extreme activities. She is NYHA 2-2b. She denies syncope.   Studies/Reports Reviewed Today:  Carotid US 1/16 Bilateral ICA 1-39% - FU 2 years  Echo (01/2013 at [facility]):  Mild LVH, EF 65-60%, Gr 2 DD.  LHC 2/15 LM: Patent LAD: Proximal 50%, mid 50%, diffuse 70% before LIMA insertion, first septal perforator with marked intramyocardial bridging, ostial D1 70% LCx: Proximal 40-50%, OM1 80% RCA: Mid 70% SVG-RCA: Patent LIMA-LAD: Patent EF 65% PCI: 2.25 x 15 mm Xience DES to the OM1 Final Conclusions:  1. Severe three-vessel coronary artery disease with continued patency of the LIMA to LAD and saphenous vein graft to distal RCA 2. Severe residual stenosis of the native left circumflex (ungrafted branch) treated successfully with PCI using a single  drug-eluting stent 3. Normal left ventricular systolic function    Past Medical History  Diagnosis Date  . Endometriosis   . Carotid artery stenosis   . CAD (coronary artery disease)   . Hyperlipemia   . Hypertension   . Hx of transfusion   . Anxiety   . Arthritis   . GERD (gastroesophageal reflux disease)     "I have lots of acid"   . Cancer     basal cell - nose   . Anemia     after tubal preg. - rec'd bld. transfusion    . Sleep apnea     uses CPAP q night - assessment, 2011- in [facility]   . 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    LIMA to LAD, SVG to RCA, EVH via right thigh  . Anginal pain   . Shortness of breath   . Diabetes mellitus     diagnosed - 1999    Past Surgical History  Procedure Laterality Date  . Abdominal hysterectomy    . Tubal ligation    . Cardiac catheterization      x 3  . Carotid endarterectomy Left   . Shoulder surgery Left   . Appendectomy    . Breast surgery      R breast- biopsy- benign   . Fracture surgery      L foot- /w screw   . Coronary artery bypass graft N/A 03/10/2013    Procedure: CORONARY ARTERY BYPASS GRAFTING (CABG);  Surgeon:  Rexene Alberts, MD;  Location: [place];  Service: Open Heart Surgery;  Laterality: N/A;  x2 using right greater saphenous vein and left internal mammary.   . Intraoperative transesophageal echocardiogram N/A 03/10/2013    Procedure: INTRAOPERATIVE TRANSESOPHAGEAL ECHOCARDIOGRAM;  Surgeon: Rexene Alberts, MD;  Location: [place];  Service: Open Heart Surgery;  Laterality: N/A;  . Endovein harvest of greater saphenous vein Right 03/10/2013    Procedure: ENDOVEIN HARVEST OF GREATER SAPHENOUS VEIN;  Surgeon: Rexene Alberts, MD;  Location: [place];  Service: Open Heart Surgery;  Laterality: Right;  . Coronary stent placement  09/30/2013    DES  LEFT CIRCUMFLEX    DR COOPER  . Left heart catheterization with coronary/graft angiogram N/A 09/30/2013    Procedure: LEFT HEART CATHETERIZATION WITH Beatrix Fetters;   Surgeon: Blane Ohara, MD;  Location: [facility] C[facility] LAB;  Service: Cardiovascular;  Laterality: N/A;     Current Outpatient Prescriptions  Medication Sig Dispense Refill  . ALPRAZolam (XANAX) 0.5 MG tablet Take 0.5 mg by mouth daily.     Marland Kitchen amLODipine (NORVASC) 10 MG tablet Take 1 tablet by mouth  daily 90 tablet 1  . aspirin 81 MG tablet Take 81 mg by mouth daily.    Marland Kitchen atenolol (TENORMIN) 25 MG tablet Take 25 mg by mouth 2 (two) times daily.     . clopidogrel (PLAVIX) 75 MG tablet Take 1 tablet by mouth  nightly 90 tablet 1  . COENZYME Q10 PO Take 100 mg by mouth daily.    . DULoxetine (CYMBALTA) 30 MG capsule Take 30 mg by mouth daily.     Marland Kitchen ESTRACE VAGINAL 0.1 MG/GM vaginal cream INSERT 1 G TWICE A WEEK BY VAGINAL ROUTE FOR 90 DAYS.  3  . fenofibrate 160 MG tablet Take 160 mg by mouth at bedtime.     Marland Kitchen FERROUS FUMARATE PO Take 65 mg by mouth daily.    Marland Kitchen glipiZIDE (GLUCOTROL) 10 MG tablet Take 10 mg by mouth 2 (two) times daily before a meal.    . HUMALOG 100 UNIT/ML injection AS DIRECTED 20 TO 30 UNITS TWICE DAILY SUBCUTANEOUS 30 DAYS  2  . hydrochlorothiazide (MICROZIDE) 12.5 MG capsule Take 12.5 mg by mouth daily.     . insulin glargine (LANTUS) 100 UNIT/ML injection Inject 60-70 Units into the skin at bedtime.     Marland Kitchen lisinopril (PRINIVIL,ZESTRIL) 20 MG tablet Take 1 tablet (20 mg total) by mouth daily. 90 tablet 3  . magnesium oxide (MAG-OX) 400 MG tablet Take 400 mg by mouth daily.    . metformin (FORTAMET) 1000 MG (OSM) 24 hr tablet Take 1,000 mg by mouth 2 (two) times daily.    . methenamine (HIPREX) 1 G tablet Take 1 g by mouth 2 (two) times daily.     . Multiple Vitamins-Minerals (WOMENS 50+ MULTI VITAMIN/MIN PO) Take by mouth.    . nitroGLYCERIN (NITROSTAT) 0.4 MG SL tablet Place 1 tablet (0.4 mg total) under the tongue every 5 (five) minutes as needed for chest pain. 25 tablet 3  . ONE TOUCH ULTRA TEST test strip 1 each by Other route daily.     . pantoprazole (PROTONIX) 40 MG  tablet Take 1 tablet by mouth  daily 90 tablet 1  . potassium phosphate, monobasic, (K-PHOS ORIGINAL) 500 MG tablet Take 325 mg by mouth daily.    . pravastatin (PRAVACHOL) 40 MG tablet Take 40 mg by mouth at bedtime.     . isosorbide mononitrate (IMDUR) 30 MG  24 hr tablet Take 0.5 tablets (15 mg total) by mouth daily. 30 tablet 11   No current facility-administered medications for this visit.    Allergies:   Review of patient's allergies indicates not on file.    Social History:  The patient  reports that she has been smoking Cigarettes.  She has been smoking about 0.50 packs per day. She has never used smokeless tobacco. She reports that she does not drink alcohol or use illicit drugs.   Family History:  The patient's family history includes Cancer in her mother; Diabetes in her brother and father; Heart disease in her brother and father; Hyperlipidemia in her brother and father; Hypertension in her brother and father; Leukemia in her sister.    ROS:   Please see the history of present illness.   Review of Systems  Musculoskeletal: Positive for joint pain and myalgias.  All other systems reviewed and are negative.     PHYSICAL EXAM: VS:  BP 130/56 mmHg  Pulse 68  Ht 5\' 2"  (1.575 m)  Wt 169 lb (76.658 kg)  BMI 30.90 kg/m2    Wt Readings from Last 3 Encounters:  05/11/15 169 lb (76.658 kg)  07/28/14 164 lb 12.8 oz (74.753 kg)  12/06/13 170 lb (77.111 kg)     GEN: Well nourished, well developed, in no acute distress HEENT: normal Neck: no JVD, no masses Cardiac:  Normal S1/S2, RRR; no murmur ,  no rubs or gallops, no edema   Respiratory:  clear to auscultation bilaterally, no wheezing, rhonchi or rales. GI: soft, nontender, nondistended, + BS MS: no deformity or atrophy Skin: warm and dry  Neuro:  CNs II-XII intact, Strength and sensation are intact Psych: Normal affect   EKG:  EKG is ordered today.  It demonstrates:   NSR, HR 68, normal axis, nonspecific ST-T wave  changes, poor R-wave progression, inferior Q waves, QTc 433   Recent Labs: No results found for requested labs within last 365 days.    Lipid Panel    Component Value Date/Time   CHOL 193 09/24/2013 1616   TRIG 229* 09/24/2013 1616   HDL 37* 09/24/2013 1616   CHOLHDL 5.2 09/24/2013 1616   VLDL 46* 09/24/2013 1616   LDLCALC 110* 09/24/2013 1616      ASSESSMENT AND PLAN:    CAD:  History of CABG in 2014 and PCI with DES to the unprotected LCx in February 2015. Patient noted some anginal symptoms at last visit with extreme exertion. She seems to feel that her symptoms have progressed over the last several months. I will advance her medical therapy by starting isosorbide 15 mg daily. I will arrange an exercise Myoview to rule out significant ischemia. Continue amlodipine, aspirin, Plavix, beta blocker, ACE inhibitor, statin.  HTN: controlled.  Hyperlipidemia:  Continue statin.  Carotid stenosis:  Recent carotid US with mild bilateral disease. Follow-up planned in 2018.  Diabetes:  Follow-up with primary care.    Medication Changes: Current medicines are reviewed at length with the patient today.  Concerns regarding medicines are as outlined above.  The following changes have been made:   Discontinued Medications   ASPIRIN 325 MG TABLET    Take 325 mg by mouth daily.   COENZYME Q10 200 MG TABS    Take 1 tablet (200 mg total) by mouth daily.   FERROUS FUMARATE (HEMOCYTE - 106 MG FE) 325 (106 FE) MG TABS TABLET    Take 1 tablet by mouth daily.   HYDROCHLOROTHIAZIDE (MICROZIDE) 12.5  MG CAPSULE    Take 1 capsule by mouth  daily   INSULIN ASPART (NOVOLOG) 100 UNIT/ML INJECTION    Inject 0-8 Units into the skin daily as needed for high blood sugar.   METFORMIN (GLUCOPHAGE) 1000 MG TABLET       NITROFURANTOIN (MACRODANTIN) 50 MG CAPSULE    Take 50 mg by mouth at bedtime.   Modified Medications   No medications on file   New Prescriptions   ISOSORBIDE MONONITRATE (IMDUR) 30 MG 24  HR TABLET    Take 0.5 tablets (15 mg total) by mouth daily.    Labs/ tests ordered today include:   Orders Placed This Encounter  Procedures  . Myocardial Perfusion Imaging  . EKG 12-Lead      Disposition:    FU with Dr. Burt Knack or me in 4 weeks to review response to nitrates and review findings and stress testing.   Signed, Versie Starks, MHS 05/11/2015 5:38 PM    [facility] [address], [place], [place]  [address] Phone: [phone]; Fax: [phone]

## 2015-05-11 ENCOUNTER — Ambulatory Visit (INDEPENDENT_AMBULATORY_CARE_PROVIDER_SITE_OTHER): Payer: Medicare Other | Admitting: Physician Assistant

## 2015-05-11 ENCOUNTER — Encounter: Payer: Self-pay | Admitting: Physician Assistant

## 2015-05-11 VITALS — BP 130/56 | HR 68 | Ht 62.0 in | Wt 169.0 lb

## 2015-05-11 DIAGNOSIS — R079 Chest pain, unspecified: Secondary | ICD-10-CM

## 2015-05-11 DIAGNOSIS — I25119 Atherosclerotic heart disease of native coronary artery with unspecified angina pectoris: Secondary | ICD-10-CM | POA: Diagnosis not present

## 2015-05-11 DIAGNOSIS — I6523 Occlusion and stenosis of bilateral carotid arteries: Secondary | ICD-10-CM

## 2015-05-11 DIAGNOSIS — E785 Hyperlipidemia, unspecified: Secondary | ICD-10-CM | POA: Diagnosis not present

## 2015-05-11 DIAGNOSIS — I2510 Atherosclerotic heart disease of native coronary artery without angina pectoris: Secondary | ICD-10-CM | POA: Diagnosis not present

## 2015-05-11 DIAGNOSIS — I10 Essential (primary) hypertension: Secondary | ICD-10-CM

## 2015-05-11 MED ORDER — ISOSORBIDE MONONITRATE ER 30 MG PO TB24: 15.0000 mg | ORAL_TABLET | Freq: Every day | ORAL | Status: DC

## 2015-05-11 NOTE — Patient Instructions (Signed)
Medication Instructions:  1. START IMDUR 30 MG TABLET WITH THE DIRECTIONS TO TAKE 1/2 TABLET DAILY = 15 MG DAILY  Labwork: NONE  Testing/Procedures: Your physician has requested that you have en exercise stress myoview. For further information please visit [email or web address]. Please follow instruction sheet, as given.   Follow-Up: SCOTT WEAVER, PAC IN ABOUT 4 WEEKS SAME DAY DR. Burt Knack IS IN THE OFFICE IF POSSIBLE  Any Other Special Instructions Will Be Listed Below (If Applicable).

## 2015-05-12 ENCOUNTER — Telehealth: Payer: Self-pay | Admitting: Cardiovascular Disease

## 2015-05-12 NOTE — Telephone Encounter (Signed)
New Message  Pt request a call back to discuss what medications she should take before Myocardial Perfusion. Please call

## 2015-05-12 NOTE — Telephone Encounter (Signed)
I spoke with the pt and advised that she hold Atenolol the night before and morning of stress test.  The pt will also not have anything to eat or drink the morning of test. I asked her to hold her diabetic medications and insulin the morning of test. Pt agreed with plan.

## 2015-05-15 ENCOUNTER — Telehealth (HOSPITAL_COMMUNITY): Payer: Self-pay | Admitting: *Deleted

## 2015-05-15 NOTE — Telephone Encounter (Signed)
Patient given detailed instructions per Myocardial Perfusion Study Information Sheet for test on 05/17/15 at 0730. Patient notified to arrive 15 minutes early and that it is imperative to arrive on time for appointment to keep from having the test rescheduled.  If you need to cancel or reschedule your appointment, please call the office within 24 hours of your appointment. Failure to do so may result in a cancellation of your appointment, and a $50 no show fee. Patient verbalized understanding. Hasspacher, Ranae Palms

## 2015-05-17 ENCOUNTER — Ambulatory Visit (HOSPITAL_COMMUNITY): Payer: Medicare Other | Attending: Internal Medicine

## 2015-05-17 DIAGNOSIS — R079 Chest pain, unspecified: Secondary | ICD-10-CM

## 2015-05-17 DIAGNOSIS — I779 Disorder of arteries and arterioles, unspecified: Secondary | ICD-10-CM | POA: Diagnosis not present

## 2015-05-17 DIAGNOSIS — I10 Essential (primary) hypertension: Secondary | ICD-10-CM | POA: Insufficient documentation

## 2015-05-17 DIAGNOSIS — E119 Type 2 diabetes mellitus without complications: Secondary | ICD-10-CM | POA: Diagnosis not present

## 2015-05-17 DIAGNOSIS — I25119 Atherosclerotic heart disease of native coronary artery with unspecified angina pectoris: Secondary | ICD-10-CM

## 2015-05-17 DIAGNOSIS — R0609 Other forms of dyspnea: Secondary | ICD-10-CM | POA: Insufficient documentation

## 2015-05-17 LAB — MYOCARDIAL PERFUSION IMAGING
LV dias vol: 89 mL
LV sys vol: 30 mL
Peak HR: 97 {beats}/min
RATE: 0.33
Rest HR: 63 {beats}/min
SDS: 2
SRS: 0
SSS: 2
TID: 1.05

## 2015-05-17 MED ORDER — TECHNETIUM TC 99M SESTAMIBI GENERIC - CARDIOLITE
32.4000 | Freq: Once | INTRAVENOUS | Status: AC | PRN
  Administered 2015-05-17: 32 via INTRAVENOUS

## 2015-05-17 MED ORDER — TECHNETIUM TC 99M SESTAMIBI GENERIC - CARDIOLITE
10.8000 | Freq: Once | INTRAVENOUS | Status: AC | PRN
  Administered 2015-05-17: 11 via INTRAVENOUS

## 2015-05-17 MED ORDER — REGADENOSON 0.4 MG/5ML IV SOLN
0.4000 mg | Freq: Once | INTRAVENOUS | Status: AC
  Administered 2015-05-17: 0.4 mg via INTRAVENOUS

## 2015-05-18 ENCOUNTER — Encounter: Payer: Self-pay | Admitting: Physician Assistant

## 2015-05-19 ENCOUNTER — Encounter: Payer: Self-pay | Admitting: *Deleted

## 2015-05-19 ENCOUNTER — Telehealth: Payer: Self-pay | Admitting: *Deleted

## 2015-05-19 NOTE — Telephone Encounter (Signed)
No answer. We have tried to reach pt by phone with no success. I will send out a letter today to pt tcb to go over stress test.

## 2015-05-22 ENCOUNTER — Telehealth: Payer: Self-pay | Admitting: *Deleted

## 2015-05-22 NOTE — Telephone Encounter (Signed)
Pt notified of myoview results by phone with verbal understanding. I stated to pt that a letter was mailed Friday 9/23 since I had not been able to reach her to go over results, pt advised ok to throw letter away, since reviewed results today.

## 2015-06-08 ENCOUNTER — Ambulatory Visit: Payer: Medicare Other | Admitting: Physician Assistant

## 2015-07-28 ENCOUNTER — Other Ambulatory Visit: Payer: Self-pay | Admitting: Cardiovascular Disease

## 2015-08-13 ENCOUNTER — Other Ambulatory Visit: Payer: Self-pay | Admitting: Cardiovascular Disease

## 2015-10-01 ENCOUNTER — Other Ambulatory Visit: Payer: Self-pay | Admitting: Cardiovascular Disease

## 2015-10-31 ENCOUNTER — Other Ambulatory Visit: Payer: Self-pay | Admitting: Cardiovascular Disease

## 2015-11-22 ENCOUNTER — Telehealth: Payer: Self-pay | Admitting: Cardiovascular Disease

## 2015-11-22 MED ORDER — CLOPIDOGREL BISULFATE 75 MG PO TABS: ORAL_TABLET | ORAL | Status: DC

## 2015-11-22 MED ORDER — AMLODIPINE BESYLATE 10 MG PO TABS: ORAL_TABLET | ORAL | Status: DC

## 2015-11-22 NOTE — Telephone Encounter (Signed)
New message   *STAT* If patient is at the pharmacy, call can be transferred to refill team.   1. Which medications need to be refilled? (please list name of each medication and dose if known)  amLODipine (NORVASC) 10 MG tablet clopidogrel (PLAVIX) 75 MG tablet pantoprazole (PROTONIX) 40 MG tablet   2. Which pharmacy/location (including street and city if local pharmacy) is medication to be sent to? [facility]   3. Do they need a 30 day or 90 day supply? 30 day supply

## 2015-11-29 DIAGNOSIS — R10A1 Flank pain, right side: Secondary | ICD-10-CM | POA: Insufficient documentation

## 2015-11-29 DIAGNOSIS — N390 Urinary tract infection, site not specified: Secondary | ICD-10-CM

## 2015-11-29 DIAGNOSIS — R109 Unspecified abdominal pain: Secondary | ICD-10-CM | POA: Insufficient documentation

## 2015-11-29 HISTORY — DX: Unspecified abdominal pain: R10.9

## 2015-11-29 HISTORY — DX: Urinary tract infection, site not specified: N39.0

## 2015-12-19 ENCOUNTER — Other Ambulatory Visit: Payer: Self-pay | Admitting: Cardiovascular Disease

## 2016-01-10 ENCOUNTER — Other Ambulatory Visit: Payer: Self-pay | Admitting: Cardiovascular Disease

## 2016-02-08 ENCOUNTER — Other Ambulatory Visit: Payer: Self-pay | Admitting: Cardiovascular Disease

## 2016-03-28 ENCOUNTER — Other Ambulatory Visit: Payer: Self-pay | Admitting: Cardiovascular Disease

## 2016-05-20 ENCOUNTER — Ambulatory Visit: Payer: Medicare Other | Admitting: Physician Assistant

## 2016-05-24 ENCOUNTER — Other Ambulatory Visit: Payer: Self-pay | Admitting: Physician Assistant

## 2016-05-24 DIAGNOSIS — I25119 Atherosclerotic heart disease of native coronary artery with unspecified angina pectoris: Secondary | ICD-10-CM

## 2016-05-24 DIAGNOSIS — R079 Chest pain, unspecified: Secondary | ICD-10-CM

## 2016-05-28 ENCOUNTER — Encounter: Payer: Self-pay | Admitting: Physician Assistant

## 2016-05-28 ENCOUNTER — Ambulatory Visit (INDEPENDENT_AMBULATORY_CARE_PROVIDER_SITE_OTHER): Payer: Medicare Other | Admitting: Physician Assistant

## 2016-05-28 ENCOUNTER — Encounter: Payer: Self-pay | Admitting: *Deleted

## 2016-05-28 VITALS — BP 178/60 | HR 74 | Ht 62.0 in | Wt 171.8 lb

## 2016-05-28 DIAGNOSIS — I25119 Atherosclerotic heart disease of native coronary artery with unspecified angina pectoris: Secondary | ICD-10-CM | POA: Diagnosis not present

## 2016-05-28 DIAGNOSIS — IMO0001 Reserved for inherently not codable concepts without codable children: Secondary | ICD-10-CM

## 2016-05-28 DIAGNOSIS — Z794 Long term (current) use of insulin: Secondary | ICD-10-CM

## 2016-05-28 DIAGNOSIS — E7800 Pure hypercholesterolemia, unspecified: Secondary | ICD-10-CM | POA: Diagnosis not present

## 2016-05-28 DIAGNOSIS — I779 Disorder of arteries and arterioles, unspecified: Secondary | ICD-10-CM

## 2016-05-28 DIAGNOSIS — I10 Essential (primary) hypertension: Secondary | ICD-10-CM

## 2016-05-28 DIAGNOSIS — I739 Peripheral vascular disease, unspecified: Secondary | ICD-10-CM

## 2016-05-28 DIAGNOSIS — E119 Type 2 diabetes mellitus without complications: Secondary | ICD-10-CM

## 2016-05-28 HISTORY — DX: Reserved for inherently not codable concepts without codable children: IMO0001

## 2016-05-28 LAB — CBC
HCT: 42.8 % (ref 35.0–45.0)
Hemoglobin: 14.9 g/dL (ref 11.7–15.5)
MCH: 31.2 pg (ref 27.0–33.0)
MCHC: 34.8 g/dL (ref 32.0–36.0)
MCV: 89.7 fL (ref 80.0–100.0)
MPV: 10 fL (ref 7.5–12.5)
Platelets: 207 10*3/uL (ref 140–400)
RBC: 4.77 MIL/uL (ref 3.80–5.10)
RDW: 12.1 % (ref 11.0–15.0)
WBC: 10.1 10*3/uL (ref 3.8–10.8)

## 2016-05-28 LAB — BASIC METABOLIC PANEL
BUN: 19 mg/dL (ref 7–25)
CO2: 25 mmol/L (ref 20–31)
Calcium: 10.2 mg/dL (ref 8.6–10.4)
Chloride: 95 mmol/L — ABNORMAL LOW (ref 98–110)
Creat: 1.04 mg/dL — ABNORMAL HIGH (ref 0.50–0.99)
Glucose, Bld: 197 mg/dL — ABNORMAL HIGH (ref 65–99)
Potassium: 4.6 mmol/L (ref 3.5–5.3)
Sodium: 133 mmol/L — ABNORMAL LOW (ref 135–146)

## 2016-05-28 LAB — PROTIME-INR
INR: 1
Prothrombin Time: 11.1 s (ref 9.0–11.5)

## 2016-05-28 MED ORDER — CARVEDILOL 6.25 MG PO TABS: 6.2500 mg | ORAL_TABLET | Freq: Two times a day (BID) | ORAL | 3 refills | Status: DC

## 2016-05-28 MED ORDER — ISOSORBIDE MONONITRATE ER 30 MG PO TB24: 30.0000 mg | ORAL_TABLET | Freq: Every day | ORAL | 11 refills | Status: DC

## 2016-05-28 NOTE — Patient Instructions (Addendum)
Medication Instructions:  1. STOP ATENOLOL 2. START COREG 6.25 MG 1 TABLET TWICE DAILY; RX HAS BEE SENT 3. INCREASE IMDUR TO 30 MG DAILY  Labwork: TODAY BMET, CBC . PT/INR  Testing/Procedures: Your physician has requested that you have a cardiac catheterization. Cardiac catheterization is used to diagnose and/or treat various heart conditions. Doctors may recommend this procedure for a number of different reasons. The most common reason is to evaluate chest pain. Chest pain can be a symptom of coronary artery disease (CAD), and cardiac catheterization can show whether plaque is narrowing or blocking your heart's arteries. This procedure is also used to evaluate the valves, as well as measure the blood flow and oxygen levels in different parts of your heart. For further information please visit [email or web address]. Please follow instruction sheet, as given.  Follow-Up: 06/21/16 @ 9:15 WITH SCOTT WEAVER, PAC   Any Other Special Instructions Will Be Listed Below (If Applicable).  If you need a refill on your cardiac medications before your next appointment, please call your pharmacy.

## 2016-05-28 NOTE — Progress Notes (Signed)
Cardiology Office Note:    Date:  05/28/2016   ID:  [name], DOB 12/19/1950, MRN [id number]  PCP:  Candi Leash, PA-C  Cardiologist:  Dr. [name]   Electrophysiologist:  n/a  Referring MD: Candi Leash, PA-C   Chief Complaint  Patient presents with  . Follow-up    CAD  . Chest Pain    History of Present Illness:    Darlene Berry is a 65 y.o. female with a hx of CAD s/p CABG in 2014, diabetes, HTN, HL, carotid artery disease s/p L CEA. She underwent LHC in 2/15 for unstable angina. Bypass grafts were patent but she had severe stenosis of a nongrafted LCx which was treated with a DES.  Myoview in 9/16 demonstrated no ischemia or scar.   She returns today by herself.  She complains of chest pain and dyspnea on exertion over the past 1 month that seems to be getting worse.  She notes chest pressure with activity with assoc diaphoresis.  She notes that her BP has been quite high recently.  She stopped HCTZ several mos ago 2/2 dry mouth.  She also feels her heart pounding in her throat with her chest pain and shortness of breath.  She denies syncope. She denies orthopnea.  She sleeps with CPAP for OSA.  She denies edema.    Prior CV studies that were reviewed today include:    Myoview 9/16 EF 67%, breast attenuation, no ischemia or scar; Low Risk  Carotid US 1/16 Bilateral ICA 1-39% - FU 2 years  Echo (01/2013 at [facility]):  Mild LVH, EF 65-60%, Gr 2 DD.  LHC 2/15 LM: Patent LAD: Proximal 50%, mid 50%, diffuse 70% before LIMA insertion, first septal perforator with marked intramyocardial bridging, ostial D1 70% LCx: Proximal 40-50%, OM1 80% RCA: Mid 70% SVG-RCA: Patent LIMA-LAD: Patent EF 65% PCI: 2.25 x 15 mm Xience DES to the OM1   Past Medical History:  Diagnosis Date  . Anemia    after tubal preg. - rec'd bld. transfusion    . Anginal pain ([place])   . Anxiety   . Arthritis   . CAD (coronary artery disease)   . Cancer ([place])    basal cell - nose    . Carotid artery stenosis   . Diabetes mellitus ([place])    diagnosed - 1999  . Endometriosis   . GERD (gastroesophageal reflux disease)    "I have lots of acid"   . History of nuclear stress test    Myoview 9/16:  EF 67%, apical defect c/w breast artifact, no ischemia; Low Risk  . Hx of transfusion   . Hyperlipemia   . Hypertension   . 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   LIMA to LAD, SVG to RCA, EVH via right thigh  . Shortness of breath   . Sleep apnea    uses CPAP q night - assessment, 2011- in [place]     Past Surgical History:  Procedure Laterality Date  . ABDOMINAL HYSTERECTOMY    . APPENDECTOMY    . BREAST SURGERY     R breast- biopsy- benign   . CARDIAC CATHETERIZATION     x 3  . CAROTID ENDARTERECTOMY Left   . CORONARY ARTERY BYPASS GRAFT N/A 03/10/2013   Procedure: CORONARY ARTERY BYPASS GRAFTING (CABG);  Surgeon: Rexene Alberts, MD;  Location: [facility];  Service: Open Heart Surgery;  Laterality: N/A;  x2 using right greater saphenous vein and left internal mammary.   Marland Kitchen  CORONARY STENT PLACEMENT  09/30/2013   DES  LEFT CIRCUMFLEX    DR COOPER  . ENDOVEIN HARVEST OF GREATER SAPHENOUS VEIN Right 03/10/2013   Procedure: ENDOVEIN HARVEST OF GREATER SAPHENOUS VEIN;  Surgeon: Rexene Alberts, MD;  Location: [facility];  Service: Open Heart Surgery;  Laterality: Right;  . FRACTURE SURGERY     L foot- /w screw   . INTRAOPERATIVE TRANSESOPHAGEAL ECHOCARDIOGRAM N/A 03/10/2013   Procedure: INTRAOPERATIVE TRANSESOPHAGEAL ECHOCARDIOGRAM;  Surgeon: Rexene Alberts, MD;  Location: [facility];  Service: Open Heart Surgery;  Laterality: N/A;  . LEFT HEART CATHETERIZATION WITH CORONARY/GRAFT ANGIOGRAM N/A 09/30/2013   Procedure: LEFT HEART CATHETERIZATION WITH Beatrix Fetters;  Surgeon: Blane Ohara, MD;  Location: [facility] CATH LAB;  Service: Cardiovascular;  Laterality: N/A;  . SHOULDER SURGERY Left   . TUBAL LIGATION      Current Medications: Current Meds  Medication Sig  . ALPRAZolam (XANAX)  0.5 MG tablet Take 0.5 mg by mouth daily.   Marland Kitchen amLODipine (NORVASC) 10 MG tablet Take 1 tablet by mouth  daily  . Ascorbic Acid (VITAMIN C) 500 MG CAPS Take by mouth.  Marland Kitchen aspirin 81 MG tablet Take 81 mg by mouth daily.  . clopidogrel (PLAVIX) 75 MG tablet Take 1 tablet by mouth  nightly  . COENZYME Q10 PO Take 100 mg by mouth daily.  . DULoxetine (CYMBALTA) 30 MG capsule Take 30 mg by mouth daily.   Marland Kitchen ESTRACE VAGINAL 0.1 MG/GM vaginal cream INSERT 1 G TWICE A WEEK BY VAGINAL ROUTE FOR 90 DAYS.  . fenofibrate 160 MG tablet Take 160 mg by mouth at bedtime.   Marland Kitchen FERROUS FUMARATE PO Take 65 mg by mouth daily.  Marland Kitchen glipiZIDE (GLUCOTROL) 10 MG tablet Take 10 mg by mouth 2 (two) times daily before a meal.  . HUMALOG 100 UNIT/ML injection AS DIRECTED 20 TO 30 UNITS TWICE DAILY SUBCUTANEOUS 30 DAYS  . insulin glargine (LANTUS) 100 UNIT/ML injection Inject 60-70 Units into the skin at bedtime.   . isosorbide mononitrate (IMDUR) 30 MG 24 hr tablet Take 1 tablet (30 mg total) by mouth daily.  Marland Kitchen lisinopril (PRINIVIL,ZESTRIL) 20 MG tablet Take 1 tablet (20 mg total) by mouth daily.  . metformin (FORTAMET) 1000 MG (OSM) 24 hr tablet Take 1,000 mg by mouth 2 (two) times daily.  . methenamine (HIPREX) 1 g tablet Take 1 g by mouth 2 (two) times daily.  . nitroGLYCERIN (NITROSTAT) 0.4 MG SL tablet Place 1 tablet (0.4 mg total) under the tongue every 5 (five) minutes as needed for chest pain.  . ONE TOUCH ULTRA TEST test strip 1 each by Other route daily.   . pantoprazole (PROTONIX) 40 MG tablet Take 1 tablet by mouth  daily  . potassium phosphate, monobasic, (K-PHOS ORIGINAL) 500 MG tablet Take 325 mg by mouth daily.  . pravastatin (PRAVACHOL) 40 MG tablet Take 40 mg by mouth at bedtime.   Marland Kitchen trimethoprim (TRIMPEX) 100 MG tablet Take 50 mg by mouth daily.  . [DISCONTINUED] atenolol (TENORMIN) 25 MG tablet Take 25 mg by mouth 2 (two) times daily.   . [DISCONTINUED] isosorbide mononitrate (IMDUR) 30 MG 24 hr tablet  TAKE 0.5 TABLETS (15 MG TOTAL) BY MOUTH DAILY.     Allergies:   Review of patient's allergies indicates no known allergies.   Social History   Social History  . Marital status: Divorced    Spouse name: N/A  . Number of children: N/A  . Years of education: N/A  Social History Main Topics  . Smoking status: Current Some Day Smoker    Packs/day: 0.50    Types: Cigarettes    Last attempt to quit: 03/10/2013  . Smokeless tobacco: Never Used  . Alcohol use No     Comment: beer- rare occasion   . Drug use: No  . Sexual activity: Not Asked   Other Topics Concern  . None   Social History Narrative  . None     Family History:  The patient's family history includes Cancer in her mother; Diabetes in her brother and father; Heart disease in her brother and father; Hyperlipidemia in her brother and father; Hypertension in her brother and father; Leukemia in her sister.   ROS:   Please see the history of present illness.    Review of Systems  HENT: Positive for headaches.   Cardiovascular: Positive for chest pain.  Hematologic/Lymphatic: Bruises/bleeds easily.   All other systems reviewed and are negative.   EKGs/Labs/Other Test Reviewed:    EKG:  EKG is  ordered today.  The ekg ordered today demonstrates NSR, HR 74, normal axis, anterior Q waves, QTc 426 ms, no change since 05/11/15.  Recent Labs: No results found for requested labs within last 8760 hours.   Recent Lipid Panel    Component Value Date/Time   CHOL 193 09/24/2013 1616   TRIG 229 (H) 09/24/2013 1616   HDL 37 (L) 09/24/2013 1616   CHOLHDL 5.2 09/24/2013 1616   VLDL 46 (H) 09/24/2013 1616   LDLCALC 110 (H) 09/24/2013 1616     Physical Exam:    VS:  BP (!) 178/60   Pulse 74   Ht 5\' 2"  (1.575 m)   Wt 171 lb 12.8 oz (77.9 kg)   BMI 31.42 kg/m     Wt Readings from Last 3 Encounters:  05/28/16 171 lb 12.8 oz (77.9 kg)  05/17/15 169 lb (76.7 kg)  05/11/15 169 lb (76.7 kg)     Physical Exam    Constitutional: She is oriented to person, place, and time. She appears well-developed and well-nourished. No distress.  HENT:  Head: Normocephalic and atraumatic.  Eyes: No scleral icterus.  Neck: No JVD present.  Cardiovascular: Normal rate, regular rhythm and normal heart sounds.   No murmur heard. Pulmonary/Chest: Effort normal. She has no wheezes. She has no rales.  Abdominal: Soft. There is no tenderness.  Musculoskeletal: She exhibits no edema.  Neurological: She is alert and oriented to person, place, and time.  Skin: Skin is warm and dry.  Psychiatric: She has a normal mood and affect.    ASSESSMENT:    1. Coronary artery disease involving native coronary artery of native heart with angina pectoris ([place])   2. Bilateral carotid artery disease ([place])   3. Essential hypertension   4. Pure hypercholesterolemia   5. Insulin dependent diabetes mellitus ([place])    PLAN:    In order of problems listed above:  1. CAD - s/p CABG in 2014 and PCI with DES to LCx in 2/15.  She now presents with chest pain and shortness of breath reminiscent of her prior angina.  She describes CCS class 3 symptoms.  She smokes cigarettes.  She is a diabetic.  She is on 3 antianginal including atenolol, Isosorbide, Amlodipine.  I recommend proceeding with cardiac catheterization to further evaluate.  I d/w Dr. [name] over the phone who agreed.  Risks and benefits of cardiac catheterization have been discussed with the patient.  These include bleeding,  infection, kidney damage, stroke, heart attack, death.  The patient understands these risks and is willing to proceed.   -  Continue ASA, Plavix, statin   -  DC Atenolol   -  Start Coreg 6.25 mg bid  -  Increase Imdur to 30 mg QD  -  She prefers to have a LHC with Dr. Burt Knack.  Will arrange next Wed.  -  Go to ED if recurrent symptoms.  D/w patient.  2. Carotid artery disease - Continue ASA, Plavix, statin.  FU Carotid US due in 1/18.  3. HTN - BP  uncontrolled.  Change Atenolol to Carvedilol and increase Isosorbide for better BP control.   4. HL - Request recent Lipids and LFTs from PCP.  Goal LDL < 70 given hx of CAD, DM.  If LDL > 70, would recommend changing to Atorvastatin for more aggressive management of Lipids.  5. DM - Hold Glucotrol, Humalog for cath.  Take 1/2 dose of Lantus pm before catheterization.  Hold Metformin 24 hours before and 48 hours after catheterization.    Medication Adjustments/Labs and Tests Ordered: Current medicines are reviewed at length with the patient today.  Concerns regarding medicines are outlined above.  Medication changes, Labs and Tests ordered today are outlined in the Patient Instructions noted below. Patient Instructions  Medication Instructions:  1. STOP ATENOLOL 2. START COREG 6.25 MG 1 TABLET TWICE DAILY; RX HAS BEE SENT 3. INCREASE IMDUR TO 30 MG DAILY  Labwork: TODAY BMET, CBC . PT/INR  Testing/Procedures: Your physician has requested that you have a cardiac catheterization. Cardiac catheterization is used to diagnose and/or treat various heart conditions. Doctors may recommend this procedure for a number of different reasons. The most common reason is to evaluate chest pain. Chest pain can be a symptom of coronary artery disease (CAD), and cardiac catheterization can show whether plaque is narrowing or blocking your heart's arteries. This procedure is also used to evaluate the valves, as well as measure the blood flow and oxygen levels in different parts of your heart. For further information please visit [email or web address]. Please follow instruction sheet, as given.  Follow-Up: 06/21/16 @ 9:15 WITH Taler Kushner, PAC   Any Other Special Instructions Will Be Listed Below (If Applicable).  If you need a refill on your cardiac medications before your next appointment, please call your pharmacy.  Signed, Richardson Dopp, PA-C  05/28/2016 1:14 PM    [facility]  [facility], [place], [place]  [address] Phone: [phone]; Fax: [phone]

## 2016-05-29 ENCOUNTER — Telehealth: Payer: Self-pay | Admitting: Physician Assistant

## 2016-05-29 ENCOUNTER — Telehealth: Payer: Self-pay | Admitting: *Deleted

## 2016-05-29 DIAGNOSIS — I10 Essential (primary) hypertension: Secondary | ICD-10-CM

## 2016-05-29 MED ORDER — CARVEDILOL 12.5 MG PO TABS: 12.5000 mg | ORAL_TABLET | Freq: Two times a day (BID) | ORAL | 3 refills | Status: DC

## 2016-05-29 NOTE — Telephone Encounter (Signed)
Called pt back and advised per Brynda Rim. PA to increase her Coreg to 12.5 mg twice daily. I advised pt since we just started her yesterday on Coreg 6.25 mg BID, she can take 2 of the 6.25 mg tablets in the AM and take 2 of the 6.25 mg tablets this will = 12.5 mg BID. I advised pt I will put a new Rx in the computer for Coreg 12.5 mg 1 tablet twice daily and when she is down to about 1 week left of the 6.25 mg tablets to call the office so that we can send in a new Rx. Pt agreeable to plan of care. Pt advised to monitor her BP and if after a couple of days of the increased dose of coreg and BP still running high to call the office. Pt said thank you.

## 2016-05-29 NOTE — Telephone Encounter (Signed)
Increase Coreg to 12.5 mg Twice daily  Richardson Dopp, PA-C   05/29/2016 12:56 PM

## 2016-05-29 NOTE — Telephone Encounter (Signed)
New Message   Pt c/o medication issue:  1. Name of Medication: Coreg  2. How are you currently taking this medication (dosage and times per day)? 6.25mg   3. Are you having a reaction (difficulty breathing--STAT)?  Per pt bp high  4. What is your medication issue? Pt call requesting to speak with RN. Pt states he bp med dosage was changed and. Pt states her bp has not changed it is still high. Please call back to discuss

## 2016-05-29 NOTE — Telephone Encounter (Signed)
Pt notified of lab results by phone with verbal understanding.  

## 2016-05-29 NOTE — Telephone Encounter (Signed)
I called pt back in regards to her call earlier about her BP still high. Pt saw Richardson Dopp, PA yesterday. At [place]  05/28/16 PA d/c Atenolol and to start Coreg 6.25 mg BID. Pt has only had 2 pills so far at this time since it was just changed yesterday. I advised pt that I will d/w Brynda Rim. PA in regards to her BP reading today:  Pt gives me first her BP reading this morning before she had taken her medication; that BP reading was 220/89 HR 80 at 8 am. Pt states she took BP medication and waited until about 10:30 am and rechecked BP and it was down to 177/60 HR 80. I advised again I will d/w PA and cb with any recommendations he may have. Pt said thank you.

## 2016-06-05 ENCOUNTER — Encounter (HOSPITAL_COMMUNITY)
Admission: RE | Disposition: A | Discharge status: Home / Self Care | Payer: Self-pay | Source: Ambulatory Visit | Attending: Cardiovascular Disease

## 2016-06-05 ENCOUNTER — Telehealth: Payer: Self-pay | Admitting: Cardiology

## 2016-06-05 ENCOUNTER — Ambulatory Visit (HOSPITAL_COMMUNITY)
Admission: RE | Admit: 2016-06-05 | Discharge: 2016-06-05 | Disposition: A | Discharge status: Home / Self Care | Payer: Medicare Other | Source: Ambulatory Visit | Attending: Cardiovascular Disease | Admitting: Cardiovascular Disease

## 2016-06-05 ENCOUNTER — Other Ambulatory Visit: Payer: Self-pay | Admitting: Cardiology

## 2016-06-05 DIAGNOSIS — Z833 Family history of diabetes mellitus: Secondary | ICD-10-CM | POA: Diagnosis not present

## 2016-06-05 DIAGNOSIS — I10 Essential (primary) hypertension: Secondary | ICD-10-CM | POA: Insufficient documentation

## 2016-06-05 DIAGNOSIS — F419 Anxiety disorder, unspecified: Secondary | ICD-10-CM | POA: Insufficient documentation

## 2016-06-05 DIAGNOSIS — F17210 Nicotine dependence, cigarettes, uncomplicated: Secondary | ICD-10-CM | POA: Diagnosis not present

## 2016-06-05 DIAGNOSIS — Z7982 Long term (current) use of aspirin: Secondary | ICD-10-CM | POA: Insufficient documentation

## 2016-06-05 DIAGNOSIS — K219 Gastro-esophageal reflux disease without esophagitis: Secondary | ICD-10-CM | POA: Diagnosis not present

## 2016-06-05 DIAGNOSIS — E119 Type 2 diabetes mellitus without complications: Secondary | ICD-10-CM | POA: Diagnosis not present

## 2016-06-05 DIAGNOSIS — G4733 Obstructive sleep apnea (adult) (pediatric): Secondary | ICD-10-CM | POA: Insufficient documentation

## 2016-06-05 DIAGNOSIS — Z8249 Family history of ischemic heart disease and other diseases of the circulatory system: Secondary | ICD-10-CM | POA: Diagnosis not present

## 2016-06-05 DIAGNOSIS — M1990 Unspecified osteoarthritis, unspecified site: Secondary | ICD-10-CM | POA: Diagnosis not present

## 2016-06-05 DIAGNOSIS — Z7902 Long term (current) use of antithrombotics/antiplatelets: Secondary | ICD-10-CM | POA: Insufficient documentation

## 2016-06-05 DIAGNOSIS — I25118 Atherosclerotic heart disease of native coronary artery with other forms of angina pectoris: Secondary | ICD-10-CM | POA: Diagnosis not present

## 2016-06-05 DIAGNOSIS — Z794 Long term (current) use of insulin: Secondary | ICD-10-CM | POA: Diagnosis not present

## 2016-06-05 DIAGNOSIS — E785 Hyperlipidemia, unspecified: Secondary | ICD-10-CM | POA: Diagnosis not present

## 2016-06-05 DIAGNOSIS — I2510 Atherosclerotic heart disease of native coronary artery without angina pectoris: Secondary | ICD-10-CM | POA: Diagnosis present

## 2016-06-05 DIAGNOSIS — I25119 Atherosclerotic heart disease of native coronary artery with unspecified angina pectoris: Secondary | ICD-10-CM

## 2016-06-05 DIAGNOSIS — Z951 Presence of aortocoronary bypass graft: Secondary | ICD-10-CM | POA: Diagnosis not present

## 2016-06-05 DIAGNOSIS — E7800 Pure hypercholesterolemia, unspecified: Secondary | ICD-10-CM | POA: Insufficient documentation

## 2016-06-05 DIAGNOSIS — Z955 Presence of coronary angioplasty implant and graft: Secondary | ICD-10-CM | POA: Diagnosis not present

## 2016-06-05 HISTORY — PX: CARDIAC CATHETERIZATION: SHX172

## 2016-06-05 LAB — GLUCOSE, CAPILLARY
Glucose-Capillary: 347 mg/dL — ABNORMAL HIGH (ref 65–99)
Glucose-Capillary: 256 mg/dL — ABNORMAL HIGH (ref 65–99)
Glucose-Capillary: 320 mg/dL — ABNORMAL HIGH (ref 65–99)
Glucose-Capillary: 332 mg/dL — ABNORMAL HIGH (ref 65–99)

## 2016-06-05 SURGERY — LEFT HEART CATH AND CORS/GRAFTS ANGIOGRAPHY
Anesthesia: LOCAL

## 2016-06-05 MED ORDER — LIDOCAINE HCL (PF) 1 % IJ SOLN
INTRAMUSCULAR | Status: DC | PRN
  Administered 2016-06-05: 20 mL

## 2016-06-05 MED ORDER — ONDANSETRON HCL 4 MG/2ML IJ SOLN: 4.0000 mg | Freq: Four times a day (QID) | INTRAMUSCULAR | Status: DC | PRN

## 2016-06-05 MED ORDER — SODIUM CHLORIDE 0.9 % IV SOLN: 250.0000 mL | INTRAVENOUS | Status: DC | PRN

## 2016-06-05 MED ORDER — ASPIRIN 81 MG PO CHEW: 81.0000 mg | CHEWABLE_TABLET | ORAL | Status: DC

## 2016-06-05 MED ORDER — SODIUM CHLORIDE 0.9 % WEIGHT BASED INFUSION: 3.0000 mL/kg/h | INTRAVENOUS | Status: DC

## 2016-06-05 MED ORDER — INSULIN ASPART 100 UNIT/ML ~~LOC~~ SOLN
0.0000 [IU] | Freq: Three times a day (TID) | SUBCUTANEOUS | Status: DC
  Administered 2016-06-05: 11 [IU] via SUBCUTANEOUS

## 2016-06-05 MED ORDER — HEPARIN (PORCINE) IN NACL 2-0.9 UNIT/ML-% IJ SOLN
INTRAMUSCULAR | Status: AC
  Filled 2016-06-05: qty 1000

## 2016-06-05 MED ORDER — ACETAMINOPHEN 325 MG PO TABS: 650.0000 mg | ORAL_TABLET | ORAL | Status: DC | PRN

## 2016-06-05 MED ORDER — INSULIN ASPART 100 UNIT/ML ~~LOC~~ SOLN
SUBCUTANEOUS | Status: AC
  Filled 2016-06-05: qty 1

## 2016-06-05 MED ORDER — FENTANYL CITRATE (PF) 100 MCG/2ML IJ SOLN
INTRAMUSCULAR | Status: DC | PRN
  Administered 2016-06-05: 50 ug via INTRAVENOUS
  Administered 2016-06-05: 25 ug via INTRAVENOUS

## 2016-06-05 MED ORDER — LABETALOL HCL 5 MG/ML IV SOLN: 20.0000 mg | INTRAVENOUS | Status: DC | PRN

## 2016-06-05 MED ORDER — SODIUM CHLORIDE 0.9% FLUSH
3.0000 mL | Freq: Two times a day (BID) | INTRAVENOUS | Status: DC
Start: 2016-06-05 — End: 2016-06-05

## 2016-06-05 MED ORDER — IOPAMIDOL (ISOVUE-370) INJECTION 76%
INTRAVENOUS | Status: DC | PRN
  Administered 2016-06-05: 75 mL via INTRA_ARTERIAL

## 2016-06-05 MED ORDER — IOPAMIDOL (ISOVUE-370) INJECTION 76%
INTRAVENOUS | Status: AC
  Filled 2016-06-05: qty 100

## 2016-06-05 MED ORDER — SODIUM CHLORIDE 0.9 % IV SOLN: INTRAVENOUS | Status: AC

## 2016-06-05 MED ORDER — MIDAZOLAM HCL 2 MG/2ML IJ SOLN
INTRAMUSCULAR | Status: AC
  Filled 2016-06-05: qty 2

## 2016-06-05 MED ORDER — SODIUM CHLORIDE 0.9 % WEIGHT BASED INFUSION: 1.0000 mL/kg/h | INTRAVENOUS | Status: DC

## 2016-06-05 MED ORDER — MIDAZOLAM HCL 2 MG/2ML IJ SOLN
INTRAMUSCULAR | Status: DC | PRN
  Administered 2016-06-05 (×2): 2 mg via INTRAVENOUS

## 2016-06-05 MED ORDER — INSULIN ASPART 100 UNIT/ML ~~LOC~~ SOLN: 5.0000 [IU] | Freq: Once | SUBCUTANEOUS | Status: DC

## 2016-06-05 MED ORDER — FENTANYL CITRATE (PF) 100 MCG/2ML IJ SOLN
INTRAMUSCULAR | Status: AC
  Filled 2016-06-05: qty 2

## 2016-06-05 MED ORDER — SODIUM CHLORIDE 0.9% FLUSH: 3.0000 mL | INTRAVENOUS | Status: DC | PRN

## 2016-06-05 MED ORDER — METOPROLOL TARTRATE 25 MG PO TABS: 25.0000 mg | ORAL_TABLET | Freq: Two times a day (BID) | ORAL | 12 refills | Status: DC

## 2016-06-05 MED ORDER — LIDOCAINE HCL (PF) 1 % IJ SOLN
INTRAMUSCULAR | Status: AC
  Filled 2016-06-05: qty 30

## 2016-06-05 MED ORDER — HYDRALAZINE HCL 20 MG/ML IJ SOLN: 2.0000 mg | INTRAMUSCULAR | Status: DC | PRN

## 2016-06-05 MED ORDER — HEPARIN (PORCINE) IN NACL 2-0.9 UNIT/ML-% IJ SOLN
INTRAMUSCULAR | Status: DC | PRN
  Administered 2016-06-05: 1000 mL

## 2016-06-05 SURGICAL SUPPLY — 8 items
CATH INFINITI 5FR MULTPACK ANG (CATHETERS) ×1 IMPLANT
KIT HEART LEFT (KITS) ×2 IMPLANT
PACK CARDIAC CATHETERIZATION (CUSTOM PROCEDURE TRAY) ×2 IMPLANT
SHEATH PINNACLE 5F 10CM (SHEATH) ×1 IMPLANT
SYR MEDRAD MARK V 150ML (SYRINGE) ×2 IMPLANT
TRANSDUCER W/STOPCOCK (MISCELLANEOUS) ×2 IMPLANT
TUBING CIL FLEX 10 FLL-RA (TUBING) ×1 IMPLANT
WIRE EMERALD 3MM-J .035X150CM (WIRE) ×1 IMPLANT

## 2016-06-05 NOTE — Discharge Instructions (Signed)
Angiogram, Care After °Refer to this sheet in the next few weeks. These instructions provide you with information about caring for yourself after your procedure. Your health care provider may also give you more specific instructions. Your treatment has been planned according to current medical practices, but problems sometimes occur. Call your health care provider if you have any problems or questions after your procedure. °WHAT TO EXPECT AFTER THE PROCEDURE °After your procedure, it is typical to have the following: °· Bruising at the catheter insertion site that usually fades within 1-2 weeks. °· Blood collecting in the tissue (hematoma) that may be painful to the touch. It should usually decrease in size and tenderness within 1-2 weeks. °HOME CARE INSTRUCTIONS °· Take medicines only as directed by your health care provider. °· You may shower 24-48 hours after the procedure or as directed by your health care provider. Remove the bandage (dressing) and gently wash the site with plain soap and water. Pat the area dry with a clean towel. Do not rub the site, because this may cause bleeding. °· Do not take baths, swim, or use a hot tub until your health care provider approves. °· Check your insertion site every day for redness, swelling, or drainage. °· Do not apply powder or lotion to the site. °· Do not lift over 10 lb (4.5 kg) for 5 days after your procedure or as directed by your health care provider. °· Ask your health care provider when it is okay to: °¨ Return to work or school. °¨ Resume usual physical activities or sports. °¨ Resume sexual activity. °· Do not drive home if you are discharged the same day as the procedure. Have someone else drive you. °· You may drive 24 hours after the procedure unless otherwise instructed by your health care provider. °· Do not operate machinery or power tools for 24 hours after the procedure or as directed by your health care provider. °· If your procedure was done as an  outpatient procedure, which means that you went home the same day as your procedure, a responsible adult should be with you for the first 24 hours after you arrive home. °· Keep all follow-up visits as directed by your health care provider. This is important. °SEEK MEDICAL CARE IF: °· You have a fever. °· You have chills. °· You have increased bleeding from the catheter insertion site. Hold pressure on the site. °SEEK IMMEDIATE MEDICAL CARE IF: °· You have unusual pain at the catheter insertion site. °· You have redness, warmth, or swelling at the catheter insertion site. °· You have drainage (other than a small amount of blood on the dressing) from the catheter insertion site. °· The catheter insertion site is bleeding, and the bleeding does not stop after 30 minutes of holding steady pressure on the site. °· The area near or just beyond the catheter insertion site becomes pale, cool, tingly, or numb. °  °This information is not intended to replace advice given to you by your health care provider. Make sure you discuss any questions you have with your health care provider. °  °Document Released: 02/28/2005 Document Revised: 09/02/2014 Document Reviewed: 01/13/2013 °Elsevier Interactive Patient Education ©2016 [employer] ° °

## 2016-06-05 NOTE — Telephone Encounter (Signed)
Called in metoprolol 25mg  BID per Dr. Antionette Char instructions post cath.

## 2016-06-05 NOTE — Progress Notes (Signed)
Assumed care of pt from Pam Hicks, RN. Assessment documented. 

## 2016-06-05 NOTE — Progress Notes (Signed)
Eliezer Champagne notified of pt's CBG

## 2016-06-05 NOTE — Interval H&P Note (Signed)
Cath Lab Visit (complete for each Cath Lab visit)  Clinical Evaluation Leading to the Procedure:   ACS: No.  Non-ACS:    Anginal Classification: CCS III  Anti-ischemic medical therapy: Maximal Therapy (2 or more classes of medications)  Non-Invasive Test Results: No non-invasive testing performed  Prior CABG: No previous CABG      History and Physical Interval Note:  06/05/2016 11:49 AM  Darlene Berry  has presented today for surgery, with the diagnosis of CAD with Angina  The various methods of treatment have been discussed with the patient and family. After consideration of risks, benefits and other options for treatment, the patient has consented to  Procedure(s): Left Heart Cath and Cors/Grafts Angiography (N/A) as a surgical intervention .  The patient's history has been reviewed, patient examined, no change in status, stable for surgery.  I have reviewed the patient's chart and labs.  Questions were answered to the patient's satisfaction.     [name]

## 2016-06-05 NOTE — Progress Notes (Addendum)
Site area: RFA Site Prior to Removal:  Level 0 Pressure Applied For:49min Manual:   yes Patient Status During Pull:  stable Post Pull Site:  Level 0 Post Pull Instructions Given:  yes Post Pull Pulses Present: palpable Dressing Applied:  tegaderm Bedrest begins @ [id number] till 1715 Comments:

## 2016-06-05 NOTE — H&P (View-Only) (Signed)
Cardiology Office Note:    Date:  05/28/2016   ID:  [name], DOB 12/19/1950, MRN [id number]  PCP:  Candi Leash, PA-C  Cardiologist:  Dr. [name]   Electrophysiologist:  n/a  Referring MD: Candi Leash, PA-C   Chief Complaint  Patient presents with  . Follow-up    CAD  . Chest Pain    History of Present Illness:    Liliyana P Dunwell is a 65 y.o. female with a hx of CAD s/p CABG in 2014, diabetes, HTN, HL, carotid artery disease s/p L CEA. She underwent LHC in 2/15 for unstable angina. Bypass grafts were patent but she had severe stenosis of a nongrafted LCx which was treated with a DES.  Myoview in 9/16 demonstrated no ischemia or scar.   She returns today by herself.  She complains of chest pain and dyspnea on exertion over the past 1 month that seems to be getting worse.  She notes chest pressure with activity with assoc diaphoresis.  She notes that her BP has been quite high recently.  She stopped HCTZ several mos ago 2/2 dry mouth.  She also feels her heart pounding in her throat with her chest pain and shortness of breath.  She denies syncope. She denies orthopnea.  She sleeps with CPAP for OSA.  She denies edema.    Prior CV studies that were reviewed today include:    Myoview 9/16 EF 67%, breast attenuation, no ischemia or scar; Low Risk  Carotid US 1/16 Bilateral ICA 1-39% - FU 2 years  Echo (01/2013 at [facility]):  Mild LVH, EF 65-60%, Gr 2 DD.  LHC 2/15 LM: Patent LAD: Proximal 50%, mid 50%, diffuse 70% before LIMA insertion, first septal perforator with marked intramyocardial bridging, ostial D1 70% LCx: Proximal 40-50%, OM1 80% RCA: Mid 70% SVG-RCA: Patent LIMA-LAD: Patent EF 65% PCI: 2.25 x 15 mm Xience DES to the OM1   Past Medical History:  Diagnosis Date  . Anemia    after tubal preg. - rec'd bld. transfusion    . Anginal pain ([place])   . Anxiety   . Arthritis   . CAD (coronary artery disease)   . Cancer ([place])    basal cell - nose    . Carotid artery stenosis   . Diabetes mellitus ([place])    diagnosed - 1999  . Endometriosis   . GERD (gastroesophageal reflux disease)    "I have lots of acid"   . History of nuclear stress test    Myoview 9/16:  EF 67%, apical defect c/w breast artifact, no ischemia; Low Risk  . Hx of transfusion   . Hyperlipemia   . Hypertension   . 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   LIMA to LAD, SVG to RCA, EVH via right thigh  . Shortness of breath   . Sleep apnea    uses CPAP q night - assessment, 2011- in [place]     Past Surgical History:  Procedure Laterality Date  . ABDOMINAL HYSTERECTOMY    . APPENDECTOMY    . BREAST SURGERY     R breast- biopsy- benign   . CARDIAC CATHETERIZATION     x 3  . CAROTID ENDARTERECTOMY Left   . CORONARY ARTERY BYPASS GRAFT N/A 03/10/2013   Procedure: CORONARY ARTERY BYPASS GRAFTING (CABG);  Surgeon: Rexene Alberts, MD;  Location: [place];  Service: Open Heart Surgery;  Laterality: N/A;  x2 using right greater saphenous vein and left internal mammary.   Marland Kitchen  CORONARY STENT PLACEMENT  09/30/2013   DES  LEFT CIRCUMFLEX    DR COOPER  . ENDOVEIN HARVEST OF GREATER SAPHENOUS VEIN Right 03/10/2013   Procedure: ENDOVEIN HARVEST OF GREATER SAPHENOUS VEIN;  Surgeon: Rexene Alberts, MD;  Location: [facility];  Service: Open Heart Surgery;  Laterality: Right;  . FRACTURE SURGERY     L foot- /w screw   . INTRAOPERATIVE TRANSESOPHAGEAL ECHOCARDIOGRAM N/A 03/10/2013   Procedure: INTRAOPERATIVE TRANSESOPHAGEAL ECHOCARDIOGRAM;  Surgeon: Rexene Alberts, MD;  Location: [place];  Service: Open Heart Surgery;  Laterality: N/A;  . LEFT HEART CATHETERIZATION WITH CORONARY/GRAFT ANGIOGRAM N/A 09/30/2013   Procedure: LEFT HEART CATHETERIZATION WITH Beatrix Fetters;  Surgeon: Blane Ohara, MD;  Location: [facility] CATH LAB;  Service: Cardiovascular;  Laterality: N/A;  . SHOULDER SURGERY Left   . TUBAL LIGATION      Current Medications: Current Meds  Medication Sig  . ALPRAZolam (XANAX)  0.5 MG tablet Take 0.5 mg by mouth daily.   Marland Kitchen amLODipine (NORVASC) 10 MG tablet Take 1 tablet by mouth  daily  . Ascorbic Acid (VITAMIN C) 500 MG CAPS Take by mouth.  Marland Kitchen aspirin 81 MG tablet Take 81 mg by mouth daily.  . clopidogrel (PLAVIX) 75 MG tablet Take 1 tablet by mouth  nightly  . COENZYME Q10 PO Take 100 mg by mouth daily.  . DULoxetine (CYMBALTA) 30 MG capsule Take 30 mg by mouth daily.   Marland Kitchen ESTRACE VAGINAL 0.1 MG/GM vaginal cream INSERT 1 G TWICE A WEEK BY VAGINAL ROUTE FOR 90 DAYS.  . fenofibrate 160 MG tablet Take 160 mg by mouth at bedtime.   Marland Kitchen FERROUS FUMARATE PO Take 65 mg by mouth daily.  Marland Kitchen glipiZIDE (GLUCOTROL) 10 MG tablet Take 10 mg by mouth 2 (two) times daily before a meal.  . HUMALOG 100 UNIT/ML injection AS DIRECTED 20 TO 30 UNITS TWICE DAILY SUBCUTANEOUS 30 DAYS  . insulin glargine (LANTUS) 100 UNIT/ML injection Inject 60-70 Units into the skin at bedtime.   . isosorbide mononitrate (IMDUR) 30 MG 24 hr tablet Take 1 tablet (30 mg total) by mouth daily.  Marland Kitchen lisinopril (PRINIVIL,ZESTRIL) 20 MG tablet Take 1 tablet (20 mg total) by mouth daily.  . metformin (FORTAMET) 1000 MG (OSM) 24 hr tablet Take 1,000 mg by mouth 2 (two) times daily.  . methenamine (HIPREX) 1 g tablet Take 1 g by mouth 2 (two) times daily.  . nitroGLYCERIN (NITROSTAT) 0.4 MG SL tablet Place 1 tablet (0.4 mg total) under the tongue every 5 (five) minutes as needed for chest pain.  . ONE TOUCH ULTRA TEST test strip 1 each by Other route daily.   . pantoprazole (PROTONIX) 40 MG tablet Take 1 tablet by mouth  daily  . potassium phosphate, monobasic, (K-PHOS ORIGINAL) 500 MG tablet Take 325 mg by mouth daily.  . pravastatin (PRAVACHOL) 40 MG tablet Take 40 mg by mouth at bedtime.   Marland Kitchen trimethoprim (TRIMPEX) 100 MG tablet Take 50 mg by mouth daily.  . [DISCONTINUED] atenolol (TENORMIN) 25 MG tablet Take 25 mg by mouth 2 (two) times daily.   . [DISCONTINUED] isosorbide mononitrate (IMDUR) 30 MG 24 hr tablet  TAKE 0.5 TABLETS (15 MG TOTAL) BY MOUTH DAILY.     Allergies:   Review of patient's allergies indicates no known allergies.   Social History   Social History  . Marital status: Divorced    Spouse name: N/A  . Number of children: N/A  . Years of education: N/A  Social History Main Topics  . Smoking status: Current Some Day Smoker    Packs/day: 0.50    Types: Cigarettes    Last attempt to quit: 03/10/2013  . Smokeless tobacco: Never Used  . Alcohol use No     Comment: beer- rare occasion   . Drug use: No  . Sexual activity: Not Asked   Other Topics Concern  . None   Social History Narrative  . None     Family History:  The patient's family history includes Cancer in her mother; Diabetes in her brother and father; Heart disease in her brother and father; Hyperlipidemia in her brother and father; Hypertension in her brother and father; Leukemia in her sister.   ROS:   Please see the history of present illness.    Review of Systems  HENT: Positive for headaches.   Cardiovascular: Positive for chest pain.  Hematologic/Lymphatic: Bruises/bleeds easily.   All other systems reviewed and are negative.   EKGs/Labs/Other Test Reviewed:    EKG:  EKG is  ordered today.  The ekg ordered today demonstrates NSR, HR 74, normal axis, anterior Q waves, QTc 426 ms, no change since 05/11/15.  Recent Labs: No results found for requested labs within last 8760 hours.   Recent Lipid Panel    Component Value Date/Time   CHOL 193 09/24/2013 1616   TRIG 229 (H) 09/24/2013 1616   HDL 37 (L) 09/24/2013 1616   CHOLHDL 5.2 09/24/2013 1616   VLDL 46 (H) 09/24/2013 1616   LDLCALC 110 (H) 09/24/2013 1616     Physical Exam:    VS:  BP (!) 178/60   Pulse 74   Ht 5\' 2"  (1.575 m)   Wt 171 lb 12.8 oz (77.9 kg)   BMI 31.42 kg/m     Wt Readings from Last 3 Encounters:  05/28/16 171 lb 12.8 oz (77.9 kg)  05/17/15 169 lb (76.7 kg)  05/11/15 169 lb (76.7 kg)     Physical Exam    Constitutional: She is oriented to person, place, and time. She appears well-developed and well-nourished. No distress.  HENT:  Head: Normocephalic and atraumatic.  Eyes: No scleral icterus.  Neck: No JVD present.  Cardiovascular: Normal rate, regular rhythm and normal heart sounds.   No murmur heard. Pulmonary/Chest: Effort normal. She has no wheezes. She has no rales.  Abdominal: Soft. There is no tenderness.  Musculoskeletal: She exhibits no edema.  Neurological: She is alert and oriented to person, place, and time.  Skin: Skin is warm and dry.  Psychiatric: She has a normal mood and affect.    ASSESSMENT:    1. Coronary artery disease involving native coronary artery of native heart with angina pectoris ([place])   2. Bilateral carotid artery disease ([place])   3. Essential hypertension   4. Pure hypercholesterolemia   5. Insulin dependent diabetes mellitus ([place])    PLAN:    In order of problems listed above:  1. CAD - s/p CABG in 2014 and PCI with DES to LCx in 2/15.  She now presents with chest pain and shortness of breath reminiscent of her prior angina.  She describes CCS class 3 symptoms.  She smokes cigarettes.  She is a diabetic.  She is on 3 antianginal including atenolol, Isosorbide, Amlodipine.  I recommend proceeding with cardiac catheterization to further evaluate.  I d/w Dr. [name] over the phone who agreed.  Risks and benefits of cardiac catheterization have been discussed with the patient.  These include bleeding,  infection, kidney damage, stroke, heart attack, death.  The patient understands these risks and is willing to proceed.   -  Continue ASA, Plavix, statin   -  DC Atenolol   -  Start Coreg 6.25 mg bid  -  Increase Imdur to 30 mg QD  -  She prefers to have a LHC with Dr. Burt Knack.  Will arrange next Wed.  -  Go to ED if recurrent symptoms.  D/w patient.  2. Carotid artery disease - Continue ASA, Plavix, statin.  FU Carotid US due in 1/18.  3. HTN - BP  uncontrolled.  Change Atenolol to Carvedilol and increase Isosorbide for better BP control.   4. HL - Request recent Lipids and LFTs from PCP.  Goal LDL < 70 given hx of CAD, DM.  If LDL > 70, would recommend changing to Atorvastatin for more aggressive management of Lipids.  5. DM - Hold Glucotrol, Humalog for cath.  Take 1/2 dose of Lantus pm before catheterization.  Hold Metformin 24 hours before and 48 hours after catheterization.    Medication Adjustments/Labs and Tests Ordered: Current medicines are reviewed at length with the patient today.  Concerns regarding medicines are outlined above.  Medication changes, Labs and Tests ordered today are outlined in the Patient Instructions noted below. Patient Instructions  Medication Instructions:  1. STOP ATENOLOL 2. START COREG 6.25 MG 1 TABLET TWICE DAILY; RX HAS BEE SENT 3. INCREASE IMDUR TO 30 MG DAILY  Labwork: TODAY BMET, CBC . PT/INR  Testing/Procedures: Your physician has requested that you have a cardiac catheterization. Cardiac catheterization is used to diagnose and/or treat various heart conditions. Doctors may recommend this procedure for a number of different reasons. The most common reason is to evaluate chest pain. Chest pain can be a symptom of coronary artery disease (CAD), and cardiac catheterization can show whether plaque is narrowing or blocking your heart's arteries. This procedure is also used to evaluate the valves, as well as measure the blood flow and oxygen levels in different parts of your heart. For further information please visit [email or web address]. Please follow instruction sheet, as given.  Follow-Up: 06/21/16 @ 9:15 WITH Damara Klunder, PAC   Any Other Special Instructions Will Be Listed Below (If Applicable).  If you need a refill on your cardiac medications before your next appointment, please call your pharmacy.  Signed, Richardson Dopp, PA-C  05/28/2016 1:14 PM    [facility]  [facility], [place], [place]  [address] Phone: [phone]; Fax: [phone]

## 2016-06-06 ENCOUNTER — Encounter (HOSPITAL_COMMUNITY): Payer: Self-pay | Admitting: Cardiovascular Disease

## 2016-06-07 ENCOUNTER — Telehealth: Payer: Self-pay | Admitting: Cardiovascular Disease

## 2016-06-07 NOTE — Telephone Encounter (Signed)
I spoke with the pt and she is anxious about her BP readings.  The pt states that she started Metoprolol Tartrate on Wednesday night after her cardiac cath.  I reviewed the pt's medication list and she has Carvedilol 12.5mg  bid listed which she has also been taking.  I advised the pt to stop Carvedilol and continue with Metoprolol Tartrate 25mg  twice a day. The pt will monitor her BP 2 times per day and keep a record.  She was advised to contact the office on Monday or Tuesday with readings so that further adjustments can be made if needed.  I also asked her to contact the office this weekend if she had any questions or concerns that she felt were urgent. Pt agreed with plan.

## 2016-06-07 NOTE — Telephone Encounter (Signed)
New Message  Pt c/o BP issue: STAT if pt c/o blurred vision, one-sided weakness or slurred speech  1. What are your last 5 BP readings? 10/13 Before taking bp med: 8:35am 215/97 pulse 87.... 10:21am 149/71 pulse 79....1:20pm 142/72.Marland Kitchen... 12:00pm 151/?Marland Kitchen..2:15pm 175   2. Are you having any other symptoms (ex. Dizziness, headache, blurred vision, passed out)? No   3. What is your BP issue? Pt call requesting to speak with RN. Pt states she is still having issues with high bp. Please call back to discuss

## 2016-06-11 ENCOUNTER — Encounter: Payer: Self-pay | Admitting: Cardiovascular Disease

## 2016-06-11 MED ORDER — METOPROLOL TARTRATE 50 MG PO TABS: 50.0000 mg | ORAL_TABLET | Freq: Two times a day (BID) | ORAL | 3 refills | Status: DC

## 2016-06-11 NOTE — Telephone Encounter (Signed)
Darlene Berry at 06/11/2016 1:10 PM   Status: Signed    New message    Pt C/O BP issue: STAT if pt C/O blurred vision, one-sided weakness or slurred speech  1. What are your last 5 BP readings?   10.14- 162/80 before med's - evening  179/86 10.15 - 169/84 evening 198/83 10.16- 202/87 evening 199/82 10.17 -173/91  @ 1 pm  173/93   2. Are you having any other symptoms (ex. Dizziness, headache, blurred vision, passed out)? Headache - today she is fine   3. What is your BP issue? Wants to discuss increase medication.        I spoke with the pt and made her aware that Dr Burt Knack had reviewed her previous message and responded this morning.    [name], MD Physician Signed  Date of Service: 06/11/2016 9:42 AM      Would go ahead and increase metoprolol to 50 mg BID, continue to monitor BP, and arrange either a PA/NP visit or PharmD [facility] visit for further med titration.  thanks         I made the pt aware of recommended medication change and she has a pending appointment with Richardson Dopp PA-C on 06/21/16. The pt will continue to monitor her BP at home with increased dose of Metoprolol Tartrate. Rx sent to the pharmacy per the pt's request.

## 2016-06-11 NOTE — Telephone Encounter (Signed)
This encounter was created in error - please disregard.

## 2016-06-11 NOTE — Telephone Encounter (Signed)
New message    Pt C/O BP issue: STAT if pt C/O blurred vision, one-sided weakness or slurred speech  1. What are your last 5 BP readings?   10.14- 162/80 before med's - evening  179/86 10.15 - 169/84 evening 198/83 10.16- 202/87 evening 199/82 10.17 -173/91  @ 1 pm  173/93   2. Are you having any other symptoms (ex. Dizziness, headache, blurred vision, passed out)? Headache - today she is fine   3. What is your BP issue? Wants to discuss increase medication.

## 2016-06-11 NOTE — Telephone Encounter (Signed)
Would go ahead and increase metoprolol to 50 mg BID, continue to monitor BP, and arrange either a PA/NP visit or PharmD [facility] Clinic visit for further med titration.  thanks

## 2016-06-20 ENCOUNTER — Encounter: Payer: Self-pay | Admitting: Physician Assistant

## 2016-06-20 NOTE — Progress Notes (Signed)
Cardiology Office Note:    Date:  06/21/2016   ID:  Darlene Berry, DOB 03-14-1951, MRN [id number]  PCP:  Candi Leash, PA-C  Cardiologist:  Dr. [name]   Electrophysiologist:  n/a  Referring MD: Candi Leash, PA-C   Chief Complaint  Patient presents with  . Follow-up    CAD status post cardiac catheterization    History of Present Illness:    Darlene Berry is a 65 y.o. female with a hx of CAD s/p CABG in 2014, diabetes, HTN, HL, carotid artery disease s/p L CEA. She underwent LHC in 2/15 for unstable angina. Bypass grafts were patent but she had severe stenosis of a nongrafted LCx which was treated with a DES.  Myoview in 9/16 demonstrated no ischemia or scar.   I saw her for FU on 05/28/16.  She complained of uncontrolled blood pressure and chest pain with exertion that seemed to be getting worse.  LHC was arranged.  This demonstrated patent L-LAD, patient S-RCA and patent LCx stent.  Medical Rx was recommended.   She returns for FU after recent cardiac catheterization.  She is here alone. She notes that she is doing well. She has an occasional brief sharp chest pain. Otherwise, she denies angina. She denies significant dyspnea. She denies without orthopnea, PND or edema. She denies syncope.  Prior CV studies that were reviewed today include:    LHC 06/05/16 LAD prox 60, D1 50; Lat D1 70 LCx ostial 40, mid stent patent RCA prox 90, mid 99 L-LAD patent S-RCA patent EF 65% Med Rx continued  Myoview 9/16 EF 67%, breast attenuation, no ischemia or scar; Low Risk  Carotid US 1/16 Bilateral ICA 1-39% - FU 2 years  Echo (01/2013 at [facility]):  Mild LVH, EF 65-60%, Gr 2 DD.  LHC 2/15 LM: Patent LAD: Proximal 50%, mid 50%, diffuse 70% before LIMA insertion, first septal perforator with marked intramyocardial bridging, ostial D1 70% LCx: Proximal 40-50%, OM1 80% RCA: Mid 70% SVG-RCA: Patent LIMA-LAD: Patent EF 65% PCI: 2.25 x 15 mm Xience DES to the  OM1  Past Medical History:  Diagnosis Date  . Anemia    after tubal preg. - rec'd bld. transfusion    . Anxiety   . Arthritis   . CAD (coronary artery disease)    a. s/p CABG in 2014 // b. LHC 2/15: grafts patent; OM1 80% >> PCI: Xience DES to OM1 // c. Myoview 9/16: no ischemia or scar, EF 67; Low Risk // d. LHC 10/17: pLAD 60, D1 50, Lat D1 70, oLCx 40, mLCx stent patent, pRCA 90, mRCA 99, EF 65 >> Med Rx.  . Carotid artery disease ([place])    Carotid US 1/16: bilateral ICA 1-39 >> FU 08/2016  . Diabetes mellitus ([place])    diagnosed - 1999  . Endometriosis   . 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 GERD (gastroesophageal reflux disease)    "I have lots of acid"   . History of echocardiogram    a. Echo 6/14 (done at [facility]):  Mild LVH, EF 65-60%, Gr 2 DD.  Marland Kitchen Hx of transfusion   . Hyperlipemia   . Hypertension   . 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   LIMA to LAD, SVG to RCA, EVH via right thigh  . Sleep apnea    uses CPAP q night - assessment, 2011- in [place]     Past Surgical History:  Procedure Laterality Date  .  ABDOMINAL HYSTERECTOMY    . APPENDECTOMY    . BREAST SURGERY     R breast- biopsy- benign   . CARDIAC CATHETERIZATION     x 3  . CARDIAC CATHETERIZATION N/A 06/05/2016   Procedure: Left Heart Cath and Cors/Grafts Angiography;  Surgeon: [name], MD;  Location: [place] CV LAB;  Service: Cardiovascular;  Laterality: N/A;  . CAROTID ENDARTERECTOMY Left   . CORONARY ARTERY BYPASS GRAFT N/A 03/10/2013   Procedure: CORONARY ARTERY BYPASS GRAFTING (CABG);  Surgeon: Rexene Alberts, MD;  Location: [place];  Service: Open Heart Surgery;  Laterality: N/A;  x2 using right greater saphenous vein and left internal mammary.   . CORONARY STENT PLACEMENT  09/30/2013   DES  LEFT CIRCUMFLEX    DR COOPER  . ENDOVEIN HARVEST OF GREATER SAPHENOUS VEIN Right 03/10/2013   Procedure: ENDOVEIN HARVEST OF GREATER SAPHENOUS VEIN;  Surgeon: Rexene Alberts, MD;  Location: [place];   Service: Open Heart Surgery;  Laterality: Right;  . FRACTURE SURGERY     L foot- /w screw   . INTRAOPERATIVE TRANSESOPHAGEAL ECHOCARDIOGRAM N/A 03/10/2013   Procedure: INTRAOPERATIVE TRANSESOPHAGEAL ECHOCARDIOGRAM;  Surgeon: Rexene Alberts, MD;  Location: [place];  Service: Open Heart Surgery;  Laterality: N/A;  . LEFT HEART CATHETERIZATION WITH CORONARY/GRAFT ANGIOGRAM N/A 09/30/2013   Procedure: LEFT HEART CATHETERIZATION WITH Beatrix Fetters;  Surgeon: Blane Ohara, MD;  Location: [facility] CATH LAB;  Service: Cardiovascular;  Laterality: N/A;  . SHOULDER SURGERY Left   . TUBAL LIGATION      Current Medications: Current Meds  Medication Sig  . ALPRAZolam (XANAX) 0.5 MG tablet Take 0.5 mg by mouth daily.   Marland Kitchen amLODipine (NORVASC) 10 MG tablet Take 1 tablet by mouth  daily  . Ascorbic Acid (VITAMIN C) 1000 MG tablet Take 1,000 mg by mouth daily.  Marland Kitchen aspirin 81 MG tablet Take 81 mg by mouth daily.  . clopidogrel (PLAVIX) 75 MG tablet Take 1 tablet by mouth  nightly  . COENZYME Q10 PO Take 100 mg by mouth daily.  . DULoxetine (CYMBALTA) 30 MG capsule Take 30 mg by mouth daily.   Marland Kitchen ESTRACE VAGINAL 0.1 MG/GM vaginal cream INSERT 1 G TWICE A WEEK BY VAGINAL ROUTE FOR 90 DAYS.  . fenofibrate 160 MG tablet Take 160 mg by mouth at bedtime.   Marland Kitchen FOLIC ACID PO Take [id number] mg by mouth daily.  Marland Kitchen glipiZIDE (GLUCOTROL) 10 MG tablet Take 10 mg by mouth 2 (two) times daily before a meal.  . HUMALOG 100 UNIT/ML injection AS DIRECTED 20 TO 30 UNITS TWICE DAILY SUBCUTANEOUS PER SLIDING SCALE  . insulin glargine (LANTUS) 100 UNIT/ML injection Inject 50 Units into the skin 2 (two) times daily.   . IRON PO Take 1 tablet by mouth daily.  . isosorbide mononitrate (IMDUR) 30 MG 24 hr tablet Take 1 tablet (30 mg total) by mouth daily.  . metformin (FORTAMET) 1000 MG (OSM) 24 hr tablet Take 1,000 mg by mouth 2 (two) times daily.  . methenamine (HIPREX) 1 g tablet Take 1 g by mouth 2 (two) times daily.  .  metoprolol tartrate (LOPRESSOR) 50 MG tablet Take 1 tablet (50 mg total) by mouth 2 (two) times daily.  . nitroGLYCERIN (NITROSTAT) 0.4 MG SL tablet Place 1 tablet (0.4 mg total) under the tongue every 5 (five) minutes as needed for chest pain.  . ONE TOUCH ULTRA TEST test strip 1 each by Other route daily.   . pantoprazole (PROTONIX) 40  MG tablet Take 40 mg by mouth daily as needed (INDIGESTION).  Marland Kitchen potassium gluconate (SM POTASSIUM) 595 (99 K) MG TABS tablet Take 595 mg by mouth daily.  . pravastatin (PRAVACHOL) 40 MG tablet Take 40 mg by mouth at bedtime.   Marland Kitchen trimethoprim (TRIMPEX) 100 MG tablet Take 50 mg by mouth daily.  . [DISCONTINUED] lisinopril (PRINIVIL,ZESTRIL) 20 MG tablet Take 1 tablet (20 mg total) by mouth daily.     Allergies:   Review of patient's allergies indicates no known allergies.   Social History   Social History  . Marital status: Divorced    Spouse name: N/A  . Number of children: N/A  . Years of education: N/A   Social History Main Topics  . Smoking status: Current Some Day Smoker    Packs/day: 0.50    Types: Cigarettes    Last attempt to quit: 03/10/2013  . Smokeless tobacco: Never Used  . Alcohol use No     Comment: beer- rare occasion   . Drug use: No  . Sexual activity: Not Asked   Other Topics Concern  . None   Social History Narrative  . None     Family History:  The patient's family history includes Cancer in her mother; Diabetes in her brother and father; Heart disease in her brother and father; Hyperlipidemia in her brother and father; Hypertension in her brother and father; Leukemia in her sister.   ROS:   Please see the history of present illness.    ROS All other systems reviewed and are negative.   EKGs/Labs/Other Test Reviewed:    EKG:  EKG is  ordered today.  The ekg ordered today demonstrates NSR, HR 71, normal axis, QTc 419 ms  Recent Labs: 05/28/2016: BUN 19; Creat 1.04; Hemoglobin 14.9; Platelets 207; Potassium 4.6; Sodium  133   Recent Lipid Panel    Component Value Date/Time   CHOL 193 09/24/2013 1616   TRIG 229 (H) 09/24/2013 1616   HDL 37 (L) 09/24/2013 1616   CHOLHDL 5.2 09/24/2013 1616   VLDL 46 (H) 09/24/2013 1616   LDLCALC 110 (H) 09/24/2013 1616     Physical Exam:    VS:  BP 140/60   Pulse 70   Ht 5\' 2"  (1.575 m)   Wt 172 lb 6.4 oz (78.2 kg)   BMI 31.53 kg/m     Wt Readings from Last 3 Encounters:  06/21/16 172 lb 6.4 oz (78.2 kg)  06/05/16 171 lb (77.6 kg)  05/28/16 171 lb 12.8 oz (77.9 kg)     Physical Exam  Constitutional: She is oriented to person, place, and time. She appears well-developed and well-nourished. No distress.  HENT:  Head: Normocephalic and atraumatic.  Eyes: No scleral icterus.  Neck: No JVD present.  Cardiovascular: Normal rate, regular rhythm and normal heart sounds.   No murmur heard. Bilateral FA bruits notes  Pulmonary/Chest: Effort normal. She has no wheezes. She has no rales.  Abdominal: Soft. There is no tenderness.  Musculoskeletal: She exhibits no edema.  R groin without hematoma  Neurological: She is alert and oriented to person, place, and time.  Skin: Skin is warm and dry.  Psychiatric: She has a normal mood and affect.    ASSESSMENT:    1. Coronary artery disease involving native coronary artery of native heart without angina pectoris   2. Bilateral carotid artery disease ([place])   3. Essential hypertension   4. Pure hypercholesterolemia   5. Pain in both lower extremities  PLAN:    In order of problems listed above:  1. CAD - s/p CABG in 2014 and PCI with DES to LCx in 2/15.  Recent LHC with patent grafts and patent LCx stent.  Medical Rx is continued.               -  Continue ASA, Plavix, statin, Metoprolol, Imdur    2. Carotid artery disease - Continue ASA, Plavix, statin.  FU Carotid US due in 1/18.  3. HTN - BP still running high in the AM at home.  BP readings better here.  Her BP cuff is fairly new. The highest readings  are in the AM prior to her medications.    -  Change Lisinopril to 10 mg bid  -  FU with HTN clinic in 3-4 weeks; bring cuff and readings  -  If BP > target, increase Lisinopril to 20 mg bid    4. HL - Continue statin.  Managed by PCP.  Goal LDL < 70.  5. Leg pain - She has a long hx of leg pain and is treated for neuropathy.  She has bilat FA bruits and these have been noted in the past.  She had normal ABIs and arterial dopplers in 2015.  She notes no changes.  No further CV testing needed.  FU with PCP.   Medication Adjustments/Labs and Tests Ordered: Current medicines are reviewed at length with the patient today.  Concerns regarding medicines are outlined above.  Medication changes, Labs and Tests ordered today are outlined in the Patient Instructions noted below. Patient Instructions  Medication Instructions:  1. CHANGE LISINOPRIL TO 10 MG TWICE DAILY; NEW RX SENT IN FOR THE 10 MG TABLET  Labwork: NONE  Testing/Procedures: 1.Your physician has requested that you have a carotid duplex. This test is an ultrasound of the carotid arteries in your neck. It looks at blood flow through these arteries that supply the brain with blood. Allow one hour for this exam. There are no restrictions or special instructions. THIS IS TO BE DONE SOMETIME IN 08/2016  Follow-Up: You have been referred TO THE HTN CLINIC AND NOT TO THE LIPID CLINIC IN 3-4 WEEKS ; Kailan Laws, PAC HAS ASKED FOR YOU TO BRING YOUR BP CUFF FROM HOME TO THE APPOINTMENT  2. Your physician wants you to follow-up in: DR. Burt Knack IN 6 MONTHS You will receive a reminder letter in the mail two months in advance. If you don't receive a letter, please call our office to schedule the follow-up appointment.  Any Other Special Instructions Will Be Listed Below (If Applicable).  If you need a refill on your cardiac medications before your next appointment, please call your pharmacy.  Signed, Richardson Dopp, PA-C  06/21/2016 1:38 PM      [facility] [address], [place], [place]  [id number] Phone: [phone]; Fax: [phone]

## 2016-06-21 ENCOUNTER — Encounter: Payer: Self-pay | Admitting: Physician Assistant

## 2016-06-21 ENCOUNTER — Ambulatory Visit (INDEPENDENT_AMBULATORY_CARE_PROVIDER_SITE_OTHER): Payer: Medicare Other | Admitting: Physician Assistant

## 2016-06-21 VITALS — BP 140/60 | HR 70 | Ht 62.0 in | Wt 172.4 lb

## 2016-06-21 DIAGNOSIS — I10 Essential (primary) hypertension: Secondary | ICD-10-CM | POA: Diagnosis not present

## 2016-06-21 DIAGNOSIS — I2510 Atherosclerotic heart disease of native coronary artery without angina pectoris: Secondary | ICD-10-CM | POA: Diagnosis not present

## 2016-06-21 DIAGNOSIS — E7800 Pure hypercholesterolemia, unspecified: Secondary | ICD-10-CM | POA: Diagnosis not present

## 2016-06-21 DIAGNOSIS — I779 Disorder of arteries and arterioles, unspecified: Secondary | ICD-10-CM | POA: Diagnosis not present

## 2016-06-21 DIAGNOSIS — M79604 Pain in right leg: Secondary | ICD-10-CM

## 2016-06-21 DIAGNOSIS — M79605 Pain in left leg: Secondary | ICD-10-CM

## 2016-06-21 DIAGNOSIS — I739 Peripheral vascular disease, unspecified: Secondary | ICD-10-CM

## 2016-06-21 MED ORDER — LISINOPRIL 10 MG PO TABS: 10.0000 mg | ORAL_TABLET | Freq: Two times a day (BID) | ORAL | 3 refills | Status: DC

## 2016-06-21 NOTE — Patient Instructions (Addendum)
Medication Instructions:  1. CHANGE LISINOPRIL TO 10 MG TWICE DAILY; NEW RX SENT IN FOR THE 10 MG TABLET  Labwork: NONE  Testing/Procedures: 1.Your physician has requested that you have a carotid duplex. This test is an ultrasound of the carotid arteries in your neck. It looks at blood flow through these arteries that supply the brain with blood. Allow one hour for this exam. There are no restrictions or special instructions. THIS IS TO BE DONE SOMETIME IN 08/2016  Follow-Up: You have been referred TO THE HTN CLINIC AND NOT TO THE LIPID CLINIC IN 3-4 WEEKS ; SCOTT WEAVER, PAC HAS ASKED FOR YOU TO BRING YOUR BP CUFF FROM HOME TO THE APPOINTMENT  2. Your physician wants you to follow-up in: DR. Burt Knack IN 6 MONTHS You will receive a reminder letter in the mail two months in advance. If you don't receive a letter, please call our office to schedule the follow-up appointment.  Any Other Special Instructions Will Be Listed Below (If Applicable).  If you need a refill on your cardiac medications before your next appointment, please call your pharmacy.

## 2016-06-22 ENCOUNTER — Other Ambulatory Visit: Payer: Self-pay | Admitting: Physician Assistant

## 2016-06-22 DIAGNOSIS — R079 Chest pain, unspecified: Secondary | ICD-10-CM

## 2016-06-22 DIAGNOSIS — I25119 Atherosclerotic heart disease of native coronary artery with unspecified angina pectoris: Secondary | ICD-10-CM

## 2016-07-11 ENCOUNTER — Telehealth: Payer: Self-pay | Admitting: Physician Assistant

## 2016-07-11 NOTE — Telephone Encounter (Signed)
Pharmacy requesting a refill on Trimethoprim 100 mg tablet. Would you like to refill this medication? Please advise

## 2016-07-12 NOTE — Telephone Encounter (Signed)
Hi Cathy,  This medication refill for Trimethoprim needs to be filled by her PCP. This is a type of antibiotic for like UTI's etc.

## 2016-07-15 NOTE — Telephone Encounter (Signed)
Called [facility]  Mail order to inform them that pt would have to refill this medication with their PCP. Representative verbalized understanding.

## 2016-07-23 ENCOUNTER — Ambulatory Visit (INDEPENDENT_AMBULATORY_CARE_PROVIDER_SITE_OTHER): Payer: Medicare Other | Admitting: Pharmacist

## 2016-07-23 VITALS — BP 168/70 | HR 79

## 2016-07-23 DIAGNOSIS — E7800 Pure hypercholesterolemia, unspecified: Secondary | ICD-10-CM

## 2016-07-23 DIAGNOSIS — I10 Essential (primary) hypertension: Secondary | ICD-10-CM | POA: Diagnosis not present

## 2016-07-23 MED ORDER — LISINOPRIL 40 MG PO TABS: 40.0000 mg | ORAL_TABLET | Freq: Every day | ORAL | 3 refills | Status: DC

## 2016-07-23 MED ORDER — ROSUVASTATIN CALCIUM 10 MG PO TABS: 10.0000 mg | ORAL_TABLET | Freq: Every day | ORAL | 3 refills | Status: DC

## 2016-07-23 NOTE — Progress Notes (Signed)
Patient ID: Darlene Berry                 DOB: [birth year]                      MRN: [id number]     HPI: Vada P Triano is a 65 y.o. female referred by Dr.Cooper to HTN clinic. PMH of HTN, DM, HLD, CAD s/p CABG in 2014, PCI s/p DES (2015), and carotid artery disease s/p LCEA. Pt underwent a cardiac cath on 10/11 2/2 exertional angina Sx. Dr. Burt Knack recommended continued medication management for residual CAD and hypertension. Patient's BP at OV on 10/27 was 140/60 mmHg on amlodipine 10 mg daily, lopressor 50 mg BID and lisinopril 10 mg daily. The lisinopril was increased to 10 mg BID with plans to further increase if needed. Pt's home blood pressure readings tend to be higher with her home cuff. She is to bring cuff to clinic for calibration. BMETs from past year indicative of possible underlying CKD with elevated SCr >1 and GFR consistently <90 mg/dL.   PCP manages pt's lipid medication. She is currently on fenofibrate 160 mg daily and pravastatin 40 mg daily. She has previously tried atorvastatin 40 mg daily and simvastatin 10 mg daily but experienced myalgias with each of these. No current lipids on file, but LDL in 2015 was 110 above goal <70 given ASCVD.  In clinic today, pt reports palpitations in her neck and chest and elevated HR of 150s upon exertion requiring up to 3 NGT SL tablets. She endorses extreme fatigue, CP, SOB, dizziness, trembling and blurred vision. She denies HA and syncope. Pt has been taking amlodipine, lisinopril and lopressor as prescribed, but ran out of Imdur 3 days ago. Scott sent in a refill of her Imdur at last OV and advised pt that refills for the next year are at her [facility]. Pt brought her BP cuff to clinic and measured her BP at 192/74 mmHg. In clinic today, her blood pressure was 168/70 mmHg and HR 100. She states that suffers from hardened arteries in her legs which causes her pain.   Current HTN meds: amlodipine 10 mg daily, lopressor 50 mg BID and lisinopril 10 mg  BID, Imdur 30 mg QD, NG 0.4 SL tab prn BP goal: <130/36mmHg (new HTN guidelines)   Family History: DM, heart disease, HLD, HTN disease (father, brother).   Social History: Current smoker of cigarettes, 0.50 packs per day. Drinks beer on rare occasion and denies illicit drug use.   Diet: Does not use salt at home, drinks 2 cups of caffeinated coffee every day.  Exercise: Is not active due to leg pain.   Home BP readings: ranges in the 150-200s/50-90s mmHg   Wt Readings from Last 3 Encounters:  06/21/16 172 lb 6.4 oz (78.2 kg)  06/05/16 171 lb (77.6 kg)  05/28/16 171 lb 12.8 oz (77.9 kg)   BP Readings from Last 3 Encounters:  06/21/16 140/60  06/05/16 (!) 148/75  05/28/16 (!) 178/60   Pulse Readings from Last 3 Encounters:  06/21/16 70  06/05/16 73  05/28/16 74    Renal function: CrCl cannot be calculated (Patient's most recent lab result is older than the maximum 21 days allowed.).  Past Medical History:  Diagnosis Date  . Anemia    after tubal preg. - rec'd bld. transfusion    . Anxiety   . Arthritis   . CAD (coronary artery disease)    a. s/p CABG in  2014 // b. LHC 2/15: grafts patent; OM1 80% >> PCI: Xience DES to OM1 // c. Myoview 9/16: no ischemia or scar, EF 67; Low Risk // d. LHC 10/17: pLAD 60, D1 50, Lat D1 70, oLCx 40, mLCx stent patent, pRCA 90, mRCA 99, EF 65 >> Med Rx.  . Carotid artery disease ([place])    Carotid US 1/16: bilateral ICA 1-39 >> FU 08/2016  . Diabetes mellitus ([place])    diagnosed - 1999  . Endometriosis   . 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 GERD (gastroesophageal reflux disease)    "I have lots of acid"   . History of echocardiogram    a. Echo 6/14 (done at [facility]):  Mild LVH, EF 65-60%, Gr 2 DD.  Marland Kitchen Hx of transfusion   . Hyperlipemia   . Hypertension   . 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   LIMA to LAD, SVG to RCA, EVH via right thigh  . Sleep apnea    uses CPAP q night - assessment, 2011- in [facility]     Current Outpatient  Prescriptions on File Prior to Visit  Medication Sig Dispense Refill  . ALPRAZolam (XANAX) 0.5 MG tablet Take 0.5 mg by mouth daily.     Marland Kitchen amLODipine (NORVASC) 10 MG tablet Take 1 tablet by mouth  daily 30 tablet 0  . Ascorbic Acid (VITAMIN C) 1000 MG tablet Take 1,000 mg by mouth daily.    Marland Kitchen aspirin 81 MG tablet Take 81 mg by mouth daily.    . clopidogrel (PLAVIX) 75 MG tablet Take 1 tablet by mouth  nightly 30 tablet 0  . COENZYME Q10 PO Take 100 mg by mouth daily.    . DULoxetine (CYMBALTA) 30 MG capsule Take 30 mg by mouth daily.     Marland Kitchen ESTRACE VAGINAL 0.1 MG/GM vaginal cream INSERT 1 G TWICE A WEEK BY VAGINAL ROUTE FOR 90 DAYS.  3  . fenofibrate 160 MG tablet Take 160 mg by mouth at bedtime.     Marland Kitchen FOLIC ACID PO Take [id number] mg by mouth daily.    Marland Kitchen glipiZIDE (GLUCOTROL) 10 MG tablet Take 10 mg by mouth 2 (two) times daily before a meal.    . HUMALOG 100 UNIT/ML injection AS DIRECTED 20 TO 30 UNITS TWICE DAILY SUBCUTANEOUS PER SLIDING SCALE  2  . insulin glargine (LANTUS) 100 UNIT/ML injection Inject 50 Units into the skin 2 (two) times daily.     . IRON PO Take 1 tablet by mouth daily.    . isosorbide mononitrate (IMDUR) 30 MG 24 hr tablet Take 1 tablet (30 mg total) by mouth daily. 30 tablet 11  . lisinopril (PRINIVIL,ZESTRIL) 10 MG tablet Take 1 tablet (10 mg total) by mouth 2 (two) times daily. 180 tablet 3  . metformin (FORTAMET) 1000 MG (OSM) 24 hr tablet Take 1,000 mg by mouth 2 (two) times daily.    . methenamine (HIPREX) 1 g tablet Take 1 g by mouth 2 (two) times daily.    . metoprolol tartrate (LOPRESSOR) 50 MG tablet Take 1 tablet (50 mg total) by mouth 2 (two) times daily. 60 tablet 3  . nitroGLYCERIN (NITROSTAT) 0.4 MG SL tablet Place 1 tablet (0.4 mg total) under the tongue every 5 (five) minutes as needed for chest pain. 25 tablet 3  . ONE TOUCH ULTRA TEST test strip 1 each by Other route daily.     . pantoprazole (PROTONIX) 40 MG tablet Take 40  mg by mouth daily as needed  (INDIGESTION).    Marland Kitchen potassium gluconate (SM POTASSIUM) 595 (99 K) MG TABS tablet Take 595 mg by mouth daily.    . pravastatin (PRAVACHOL) 40 MG tablet Take 40 mg by mouth at bedtime.     Marland Kitchen trimethoprim (TRIMPEX) 100 MG tablet Take 50 mg by mouth daily.     No current facility-administered medications on file prior to visit.     No Known Allergies   Assessment/Plan:  1. Hypertension - Pt's blood pressure is above the goal of <130/80 mmHg. Will optimize lisinopril dose to 40 mg daily. Continue current dose of lopressor, amlodipine, and Imdur. Advised pt to limit caffeine intake as this can elevate blood pressure. Pt to follow-up in clinic in 2 weeks for a blood pressure check and a BMET. Of note, home BP cuff ran 25 pts systolic higher than clinic reading today.  2. Hyperlipidemia - Pt is currently not on optimal statin therapy due to clinical ASCVD, goal <70 mg/dL. Discontinue pravastatin and start Crestor 10 mg daily and monitor for myalgias. If pt tolerates this dose, will increase Crestor to 20 mg daily. Pt to return to clinic in 3 months for a fasting lipid panel. Will schedule this lab appointment at next blood pressure follow-up.   Pt appears to experience persistent anginal symptoms that are affecting her quality of life. Advised pt to follow-up with Dr. Burt Knack concerning this matter.  Patient seen by Leroy Libman, P4 pharmacy student.  Kang Ishida E. Clark Clowdus, PharmD, [place] [id number], [place], [place] [address] Phone: [phone]; Fax: [phone] 07/23/2016 4:59 PM

## 2016-07-23 NOTE — Patient Instructions (Addendum)
Start taking lisinopril 40 mg daily for your blood pressure.   Continue taking amlodipine, lopressor and Imdur as prescribed.  Discontinue taking pravastatin. Start taking Crestor 10 mg daily for LDL cholesterol lowering.   Follow-up in clinic in 2 weeks to check blood pressure and obtain bloodwork.   Call the clinic to schedule an appointment with Dr. Copper for persistent angina symptoms.

## 2016-07-25 ENCOUNTER — Other Ambulatory Visit: Payer: Self-pay

## 2016-07-25 DIAGNOSIS — I10 Essential (primary) hypertension: Secondary | ICD-10-CM

## 2016-07-25 MED ORDER — ISOSORBIDE MONONITRATE ER 30 MG PO TB24: 30.0000 mg | ORAL_TABLET | Freq: Every day | ORAL | 3 refills | Status: DC

## 2016-08-06 ENCOUNTER — Ambulatory Visit (INDEPENDENT_AMBULATORY_CARE_PROVIDER_SITE_OTHER): Payer: Medicare Other | Admitting: Pharmacist

## 2016-08-06 ENCOUNTER — Encounter: Payer: Self-pay | Admitting: Pharmacist

## 2016-08-06 VITALS — BP 132/62 | HR 71 | Wt 168.0 lb

## 2016-08-06 DIAGNOSIS — I10 Essential (primary) hypertension: Secondary | ICD-10-CM

## 2016-08-06 MED ORDER — METOPROLOL TARTRATE 50 MG PO TABS: 50.0000 mg | ORAL_TABLET | Freq: Two times a day (BID) | ORAL | 1 refills | Status: DC

## 2016-08-06 NOTE — Patient Instructions (Signed)
Return for a follow up appointment in 2 months  Check your blood pressure at home daily (if able) and keep record of the readings.  Take your BP meds as follows: Continue all medications as prescribed.   Bring all of your meds, your BP cuff and your record of home blood pressures to your next appointment.  Exercise as you're able, try to walk approximately 30 minutes per day.  Keep salt intake to a minimum, especially watch canned and prepared boxed foods.  Eat more fresh fruits and vegetables and fewer canned items.  Avoid eating in fast food restaurants.    HOW TO TAKE YOUR BLOOD PRESSURE: . Rest 5 minutes before taking your blood pressure. .  Don't smoke or drink caffeinated beverages for at least 30 minutes before. . Take your blood pressure before (not after) you eat. . Sit comfortably with your back supported and both feet on the floor (don't cross your legs). . Elevate your arm to heart level on a table or a desk. . Use the proper sized cuff. It should fit smoothly and snugly around your bare upper arm. There should be enough room to slip a fingertip under the cuff. The bottom edge of the cuff should be 1 inch above the crease of the elbow. . Ideally, take 3 measurements at one sitting and record the average.

## 2016-08-06 NOTE — Progress Notes (Signed)
Patient ID: Darlene Berry                 DOB: [birth year]                      MRN: [id number]     HPI: Darlene Berry is a 65 y.o. female patient of Dr. Burt Knack with PMH below who presents today for hypertension follow up. Pt underwent a cardiac cath on 10/11 2/2 exertional angina Sx. Dr. Burt Knack recommended continued medication management for residual CAD and hypertension. PCP manages pt's lipid medication. She is currently on fenofibrate 160 mg daily and pravastatin 40 mg daily. She has previously tried atorvastatin 40 mg daily and simvastatin 10 mg daily but experienced myalgias with each of these. No current lipids on file, but LDL in 2015 was 110 above goal <70 given ASCVD. At her most recent visit in hypertension clinic, her lisinopril was increased to 40mg  daily. Her caffeine intake was also discussed. Of note, her cuff measured about 70mmHg greater than manual reading.   Today she presents stating that she feels much better than before. She has restarted her Imdur and been taking lisinopril 20mg  BID. She states she no longer "feels her pulse" in her head and neck. Overall she feels she is tolerating her new medications well.    Cardiac Hx: HTN, DM, HLD, CAD s/p CABG in 2014, PCI s/p DES (2015), and carotid artery disease s/p LCEA  Current HTN meds:  amlodipine 10 mg daily lopressor 50 mg BID  lisinopril 20mg  BID Imdur 30 mg QD  BP goal: <130/38mmHg  Family History: DM, heart disease, HLD, HTN disease (father, brother).   Social History: Current smoker of cigarettes, 0.50 packs per day. Drinks beer on rare occasion and denies illicit drug use.   Diet: Does not use salt at home, drinks 2 cups of caffeinated coffee every day.  Exercise: Is not active due to leg pain.  Home BP readings:  She has not really been monitoring since her last visit because her cuff measured so much higher. She states she has plans to purchase a new cuff.   Wt Readings from Last 3 Encounters:    08/06/16 168 lb (76.2 kg)  06/21/16 172 lb 6.4 oz (78.2 kg)  06/05/16 171 lb (77.6 kg)   BP Readings from Last 3 Encounters:  08/06/16 132/62  07/23/16 (!) 168/70  06/21/16 140/60   Pulse Readings from Last 3 Encounters:  08/06/16 71  07/23/16 79  06/21/16 70    Renal function: CrCl cannot be calculated (Patient's most recent lab result is older than the maximum 21 days allowed.).  Past Medical History:  Diagnosis Date  . Anemia    after tubal preg. - rec'd bld. transfusion    . Anxiety   . Arthritis   . CAD (coronary artery disease)    a. s/p CABG in 2014 // b. LHC 2/15: grafts patent; OM1 80% >> PCI: Xience DES to OM1 // c. Myoview 9/16: no ischemia or scar, EF 67; Low Risk // d. LHC 10/17: pLAD 60, D1 50, Lat D1 70, oLCx 40, mLCx stent patent, pRCA 90, mRCA 99, EF 65 >> Med Rx.  . Carotid artery disease ([place])    Carotid US 1/16: bilateral ICA 1-39 >> FU 08/2016  . Diabetes mellitus ([place])    diagnosed - 1999  . Endometriosis   . 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 GERD (gastroesophageal  reflux disease)    "I have lots of acid"   . History of echocardiogram    a. Echo 6/14 (done at [facility]):  Mild LVH, EF 65-60%, Gr 2 DD.  Marland Kitchen Hx of transfusion   . Hyperlipemia   . Hypertension   . 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   LIMA to LAD, SVG to RCA, EVH via right thigh  . Sleep apnea    uses CPAP q night - assessment, 2011- in [facility]     Current Outpatient Prescriptions on File Prior to Visit  Medication Sig Dispense Refill  . ALPRAZolam (XANAX) 0.5 MG tablet Take 0.5 mg by mouth daily.     Marland Kitchen amLODipine (NORVASC) 10 MG tablet Take 1 tablet by mouth  daily 30 tablet 0  . Ascorbic Acid (VITAMIN C) 1000 MG tablet Take 1,000 mg by mouth daily.    Marland Kitchen aspirin 81 MG tablet Take 81 mg by mouth daily.    . clopidogrel (PLAVIX) 75 MG tablet Take 1 tablet by mouth  nightly 30 tablet 0  . COENZYME Q10 PO Take 100 mg by mouth daily.    . DULoxetine (CYMBALTA) 30 MG capsule  Take 30 mg by mouth daily.     Marland Kitchen ESTRACE VAGINAL 0.1 MG/GM vaginal cream INSERT 1 G TWICE A WEEK BY VAGINAL ROUTE FOR 90 DAYS.  3  . fenofibrate 160 MG tablet Take 160 mg by mouth at bedtime.     Marland Kitchen FOLIC ACID PO Take [id number] mg by mouth daily.    Marland Kitchen glipiZIDE (GLUCOTROL) 10 MG tablet Take 10 mg by mouth 2 (two) times daily before a meal.    . HUMALOG 100 UNIT/ML injection AS DIRECTED 20 TO 30 UNITS TWICE DAILY SUBCUTANEOUS PER SLIDING SCALE  2  . insulin glargine (LANTUS) 100 UNIT/ML injection Inject 50 Units into the skin 2 (two) times daily.     . IRON PO Take 1 tablet by mouth daily.    . isosorbide mononitrate (IMDUR) 30 MG 24 hr tablet Take 1 tablet (30 mg total) by mouth daily. 90 tablet 3  . lisinopril (PRINIVIL,ZESTRIL) 40 MG tablet Take 1 tablet (40 mg total) by mouth daily. (Patient taking differently: Take 20 mg by mouth 2 (two) times daily. ) 90 tablet 3  . metformin (FORTAMET) 1000 MG (OSM) 24 hr tablet Take 1,000 mg by mouth 2 (two) times daily.    . methenamine (HIPREX) 1 g tablet Take 1 g by mouth 2 (two) times daily.    . nitroGLYCERIN (NITROSTAT) 0.4 MG SL tablet Place 1 tablet (0.4 mg total) under the tongue every 5 (five) minutes as needed for chest pain. 25 tablet 3  . ONE TOUCH ULTRA TEST test strip 1 each by Other route daily.     . pantoprazole (PROTONIX) 40 MG tablet Take 40 mg by mouth daily as needed (INDIGESTION).    Marland Kitchen potassium gluconate (SM POTASSIUM) 595 (99 K) MG TABS tablet Take 595 mg by mouth daily.    . rosuvastatin (CRESTOR) 10 MG tablet Take 1 tablet (10 mg total) by mouth daily. 90 tablet 3  . trimethoprim (TRIMPEX) 100 MG tablet Take 50 mg by mouth daily.     No current facility-administered medications on file prior to visit.     No Known Allergies  Blood pressure 132/62, pulse 71, weight 168 lb (76.2 kg), SpO2 99 %.   Assessment/Plan: Hypertension: BMET today after increase dose of lisinopril. BP today much improved on current regimen, but still  borderline at goal based on current guidelines. No medication changes today. Follow up in hypertension clinic in 2 months.   Hyperlipidemia: Pt to have lipids done with primary care visit in February. She will have these results faxed to our office after change to Crestor.   Thank you, Lelan Pons. [place] Hammersmith, [facility]  08/06/2016 12:59 PM

## 2016-08-07 ENCOUNTER — Encounter: Payer: Self-pay | Admitting: Cardiovascular Disease

## 2016-08-07 LAB — BASIC METABOLIC PANEL
BUN: 18 mg/dL (ref 7–25)
CO2: 23 mmol/L (ref 20–31)
Calcium: 9.5 mg/dL (ref 8.6–10.4)
Chloride: 98 mmol/L (ref 98–110)
Creat: 0.89 mg/dL (ref 0.50–0.99)
Glucose, Bld: 146 mg/dL — ABNORMAL HIGH (ref 65–99)
Potassium: 4.5 mmol/L (ref 3.5–5.3)
Sodium: 133 mmol/L — ABNORMAL LOW (ref 135–146)

## 2016-08-07 NOTE — Telephone Encounter (Signed)
Pt is returning call for test results °

## 2016-08-07 NOTE — Telephone Encounter (Signed)
This encounter was created in error - please disregard.

## 2016-09-02 ENCOUNTER — Inpatient Hospital Stay (HOSPITAL_COMMUNITY): Admission: RE | Admit: 2016-09-02 | Payer: Medicare Other | Source: Ambulatory Visit

## 2016-09-04 ENCOUNTER — Ambulatory Visit (HOSPITAL_COMMUNITY)
Admission: RE | Admit: 2016-09-04 | Discharge: 2016-09-04 | Disposition: A | Discharge status: Home / Self Care | Payer: Medicare Other | Source: Ambulatory Visit | Attending: Cardiovascular Disease | Admitting: Cardiovascular Disease

## 2016-09-04 ENCOUNTER — Encounter: Payer: Self-pay | Admitting: Physician Assistant

## 2016-09-04 DIAGNOSIS — I6523 Occlusion and stenosis of bilateral carotid arteries: Secondary | ICD-10-CM | POA: Diagnosis not present

## 2016-09-04 DIAGNOSIS — I779 Disorder of arteries and arterioles, unspecified: Secondary | ICD-10-CM

## 2016-09-04 DIAGNOSIS — I739 Peripheral vascular disease, unspecified: Secondary | ICD-10-CM

## 2016-09-05 ENCOUNTER — Telehealth: Payer: Self-pay | Admitting: Physician Assistant

## 2016-09-05 DIAGNOSIS — I779 Disorder of arteries and arterioles, unspecified: Secondary | ICD-10-CM

## 2016-09-05 DIAGNOSIS — I739 Peripheral vascular disease, unspecified: Principal | ICD-10-CM

## 2016-09-05 NOTE — Telephone Encounter (Signed)
Pt notified of Carotid U/S results and findings by phone with verbal understanding. Pt agreeable to plan of care to repeat Carotids in 1 year.

## 2016-09-05 NOTE — Telephone Encounter (Signed)
New message ° ° ° ° ° °Returning a call to the nurse to get test results °

## 2016-09-06 ENCOUNTER — Encounter: Payer: Self-pay | Admitting: Physician Assistant

## 2016-09-06 ENCOUNTER — Telehealth: Payer: Self-pay | Admitting: *Deleted

## 2016-09-06 NOTE — Telephone Encounter (Signed)
I called the pt to let her know that her Carotid U/S was reviewed again and the plaque is actually mild on both sides (0-39%) and not 40-59% on the L as previously reported. Pt said thank you. Pt agreeable to repeat Carotids in 1 year.

## 2016-10-22 ENCOUNTER — Ambulatory Visit (INDEPENDENT_AMBULATORY_CARE_PROVIDER_SITE_OTHER): Payer: Medicare Other | Admitting: Pharmacist

## 2016-10-22 ENCOUNTER — Encounter: Payer: Self-pay | Admitting: Pharmacist

## 2016-10-22 VITALS — BP 142/68 | HR 91

## 2016-10-22 DIAGNOSIS — E7800 Pure hypercholesterolemia, unspecified: Secondary | ICD-10-CM | POA: Diagnosis not present

## 2016-10-22 DIAGNOSIS — I10 Essential (primary) hypertension: Secondary | ICD-10-CM

## 2016-10-22 MED ORDER — LISINOPRIL 20 MG PO TABS: 20.0000 mg | ORAL_TABLET | Freq: Two times a day (BID) | ORAL | 3 refills | Status: DC

## 2016-10-22 MED ORDER — METOPROLOL TARTRATE 50 MG PO TABS: 75.0000 mg | ORAL_TABLET | Freq: Two times a day (BID) | ORAL | 1 refills | Status: DC

## 2016-10-22 MED ORDER — ROSUVASTATIN CALCIUM 20 MG PO TABS: 20.0000 mg | ORAL_TABLET | Freq: Every day | ORAL | 3 refills | Status: DC

## 2016-10-22 NOTE — Progress Notes (Signed)
Patient ID: Darlene Berry                 DOB: 09-Oct-1950                      MRN: [id number]     HPI: Darlene Berry is a 66 y.o. female patient of Dr. Burt Knack with PMH below who presents today for hypertension follow up. Pt underwent a cardiac cath in 05/2016 2/2 exertional angina Sx. Dr. Burt Knack recommended continued medication management for residual CAD and hypertension. PCP manages pt's lipid medication. She is currently on fenofibrate 160 mg daily and pravastatin 40 mg daily. She haspreviously tried atorvastatin 40 mg daily and simvastatin 10 mg daily but experienced myalgias with each of these. No current lipids on file, but LDL in 2015 was 110 above goal <70 given ASCVD. At her most recent OV with HTN clinic her pressure was borderline at goal <130/80 and no medication changes were made.   She reports that she has been doing well since our last visit but her heart rate does increase occasionally. She associates these episodes of higher heart rate with stressful situations. She has been under a lot of stress looking after her brother   She brings her most recent labs from PCP with her today which reveal:  10/03/2016 TC 154  TG 172  HDL 37  LDL 83 - rosuvastatin 10mg  daily  Cardiac Hx: HTN, DM, HLD, CAD s/p CABG in 2014, PCI s/p DES (2015), and carotid artery disease s/p LCEA  Current HTN meds:  amlodipine 10 mg daily lopressor 50 mg BID  lisinopril 20mg  BID Imdur 30 mg QD  BP goal: <130/9mmHg  Family History: DM, heart disease, HLD, HTN disease (father, brother).   Social History: Current smoker of cigarettes, 0.50 packs per day. Drinks beer on rare occasion and denies illicit drug use.   Diet: Does not use salt at home, drinks 2 cups of caffeinated coffee every day.  Exercise:Is not active due to leg pain.  Home BP readings: Has not checked but has been checking HR and O2 saturation.   Wt Readings from Last 3 Encounters:  08/06/16 168 lb (76.2 kg)  06/21/16 172  lb 6.4 oz (78.2 kg)  06/05/16 171 lb (77.6 kg)   BP Readings from Last 3 Encounters:  10/22/16 (!) 142/68  08/06/16 132/62  07/23/16 (!) 168/70   Pulse Readings from Last 3 Encounters:  10/22/16 91  08/06/16 71  07/23/16 79    Renal function: CrCl cannot be calculated (Patient's most recent lab result is older than the maximum 21 days allowed.).  Past Medical History:  Diagnosis Date  . Anemia    after tubal preg. - rec'd bld. transfusion    . Anxiety   . Arthritis   . CAD (coronary artery disease)    a. s/p CABG in 2014 // b. LHC 2/15: grafts patent; OM1 80% >> PCI: Xience DES to OM1 // c. Myoview 9/16: no ischemia or scar, EF 67; Low Risk // d. LHC 10/17: pLAD 60, D1 50, Lat D1 70, oLCx 40, mLCx stent patent, pRCA 90, mRCA 99, EF 65 >> Med Rx.  . Carotid artery disease ([place])    Carotid US 1/16: bilateral ICA 1-39 >> FU 08/2016 // Carotid US 1/18: 0-39% bilat ICA; Elevated blood flow velocities in the subclavian arteries, bilaterally; FU 1 year.  . Diabetes mellitus ([place])    diagnosed - 1999  . Endometriosis   .  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 GERD (gastroesophageal reflux disease)    "I have lots of acid"   . History of echocardiogram    a. Echo 6/14 (done at [facility]):  Mild LVH, EF 65-60%, Gr 2 DD.  Marland Kitchen Hx of transfusion   . Hyperlipemia   . Hypertension   . 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   LIMA to LAD, SVG to RCA, EVH via right thigh  . Sleep apnea    uses CPAP q night - assessment, 2011- in [facility]     Current Outpatient Prescriptions on File Prior to Visit  Medication Sig Dispense Refill  . ALPRAZolam (XANAX) 0.5 MG tablet Take 0.5 mg by mouth daily.     Marland Kitchen amLODipine (NORVASC) 10 MG tablet Take 1 tablet by mouth  daily 30 tablet 0  . Ascorbic Acid (VITAMIN C) 1000 MG tablet Take 1,000 mg by mouth daily.    Marland Kitchen aspirin 81 MG tablet Take 81 mg by mouth daily.    . clopidogrel (PLAVIX) 75 MG tablet Take 1 tablet by mouth  nightly 30 tablet 0  .  COENZYME Q10 PO Take 100 mg by mouth daily.    . DULoxetine (CYMBALTA) 30 MG capsule Take 30 mg by mouth daily.     Marland Kitchen ESTRACE VAGINAL 0.1 MG/GM vaginal cream INSERT 1 G TWICE A WEEK BY VAGINAL ROUTE FOR 90 DAYS.  3  . fenofibrate 160 MG tablet Take 160 mg by mouth at bedtime.     Marland Kitchen FOLIC ACID PO Take [id number] mg by mouth daily.    Marland Kitchen glipiZIDE (GLUCOTROL) 10 MG tablet Take 10 mg by mouth 2 (two) times daily before a meal.    . HUMALOG 100 UNIT/ML injection AS DIRECTED 20 TO 30 UNITS TWICE DAILY SUBCUTANEOUS PER SLIDING SCALE  2  . insulin glargine (LANTUS) 100 UNIT/ML injection Inject 50 Units into the skin 2 (two) times daily.     . IRON PO Take 1 tablet by mouth daily.    . isosorbide mononitrate (IMDUR) 30 MG 24 hr tablet Take 1 tablet (30 mg total) by mouth daily. 90 tablet 3  . metformin (FORTAMET) 1000 MG (OSM) 24 hr tablet Take 1,000 mg by mouth 2 (two) times daily.    . methenamine (HIPREX) 1 g tablet Take 1 g by mouth 2 (two) times daily.    . ONE TOUCH ULTRA TEST test strip 1 each by Other route daily.     . pantoprazole (PROTONIX) 40 MG tablet Take 40 mg by mouth daily as needed (INDIGESTION).    Marland Kitchen potassium gluconate (SM POTASSIUM) 595 (99 K) MG TABS tablet Take 595 mg by mouth daily.    Marland Kitchen trimethoprim (TRIMPEX) 100 MG tablet Take 50 mg by mouth daily.    . nitroGLYCERIN (NITROSTAT) 0.4 MG SL tablet Place 1 tablet (0.4 mg total) under the tongue every 5 (five) minutes as needed for chest pain. (Patient not taking: Reported on 10/22/2016) 25 tablet 3   No current facility-administered medications on file prior to visit.     No Known Allergies  Blood pressure (!) 142/68, pulse 91.   Assessment/Plan: Hypertension: BP not at goal. Will increase metoprolol to 75mg  BID to help with BP and HR. She also believes that taking lisinopril 20mg  BID was better than 40mg  daily. Will change her dose back to 20mg  BID. Follow up in 4 weeks.   Hyperlipidemia: Her LDL is improved on rosuvastatin 10mg   daily, but still above goal <70. She is agreeable to increasing rosuvastatin to 20mg  daily. She will call if muscle aching becomes intolerable. She plans to repeat labs through PCP in 3 months and will send results.    Thank you, Lelan Pons. [place] Hammersmith, PharmD, BCPS, [place]  10/22/2016 11:45 AM

## 2016-10-22 NOTE — Patient Instructions (Addendum)
Return for a follow up appointment in 4 weeks  Check your blood pressure at home daily (if able) and keep record of the readings.  Take your BP meds as follows: INCREASE crestor (rosuvastatin) to 20mg  daily  INCREASE metoprolol to 75mg  twice daily RESTART lisinopril 20mg  TWICE daily  Bring all of your meds, your BP cuff and your record of home blood pressures to your next appointment.  Exercise as you're able, try to walk approximately 30 minutes per day.  Keep salt intake to a minimum, especially watch canned and prepared boxed foods.  Eat more fresh fruits and vegetables and fewer canned items.  Avoid eating in fast food restaurants.    HOW TO TAKE YOUR BLOOD PRESSURE: . Rest 5 minutes before taking your blood pressure. .  Don't smoke or drink caffeinated beverages for at least 30 minutes before. . Take your blood pressure before (not after) you eat. . Sit comfortably with your back supported and both feet on the floor (don't cross your legs). . Elevate your arm to heart level on a table or a desk. . Use the proper sized cuff. It should fit smoothly and snugly around your bare upper arm. There should be enough room to slip a fingertip under the cuff. The bottom edge of the cuff should be 1 inch above the crease of the elbow. . Ideally, take 3 measurements at one sitting and record the average.

## 2016-10-29 DIAGNOSIS — R351 Nocturia: Secondary | ICD-10-CM | POA: Insufficient documentation

## 2016-10-29 DIAGNOSIS — N3000 Acute cystitis without hematuria: Secondary | ICD-10-CM

## 2016-10-29 DIAGNOSIS — R300 Dysuria: Secondary | ICD-10-CM

## 2016-10-29 DIAGNOSIS — R350 Frequency of micturition: Secondary | ICD-10-CM | POA: Insufficient documentation

## 2016-10-29 DIAGNOSIS — R8290 Unspecified abnormal findings in urine: Secondary | ICD-10-CM | POA: Insufficient documentation

## 2016-10-29 HISTORY — DX: Dysuria: R30.0

## 2016-10-29 HISTORY — DX: Acute cystitis without hematuria: N30.00

## 2016-10-29 HISTORY — DX: Nocturia: R35.1

## 2016-10-31 DIAGNOSIS — I739 Peripheral vascular disease, unspecified: Secondary | ICD-10-CM | POA: Insufficient documentation

## 2016-10-31 DIAGNOSIS — E1151 Type 2 diabetes mellitus with diabetic peripheral angiopathy without gangrene: Secondary | ICD-10-CM | POA: Insufficient documentation

## 2016-10-31 DIAGNOSIS — Z794 Long term (current) use of insulin: Secondary | ICD-10-CM | POA: Insufficient documentation

## 2016-11-26 ENCOUNTER — Ambulatory Visit (INDEPENDENT_AMBULATORY_CARE_PROVIDER_SITE_OTHER): Payer: Medicare Other | Admitting: Pharmacist

## 2016-11-26 VITALS — BP 132/58 | HR 73

## 2016-11-26 DIAGNOSIS — I10 Essential (primary) hypertension: Secondary | ICD-10-CM

## 2016-11-26 NOTE — Progress Notes (Signed)
Patient ID: [name]                 DOB: Jun 28, 1951                    MRN: [id number]     HPI: Darlene Berry is a 66 y.o. female patient of Dr Darlene Berry who presents today for HTN follow up. PMH is significant for HTN, DM, HLD, CAD s/p CABG in 2014, PCI s/p DES (2015), and carotid artery disease s/p LCEA. At last visit, her metoprolol dose was increased to 75mg  BID. She also switched back from lisinopril 40mg  daily to 20mg  BID because she felt that BID dosing controlled her BP better.   Pt reports adherence to BP medications. She states she is feeling much better since increasing her dose of metoprolol - she has not had any chest pressure. She states can tell when her BP is high because she feels anxious.  She is feeling well on her higher dose of Crestor 20mg  daily. States her PCP will recheck her cholesterol in May at her next f/u visit.  Current HTN meds: amlodipine 10 mg daily, lopressor 75 mg BID, lisinopril 20 mg BID, Imdur 30 mg daily BP goal: <130/78mmHg  Family History: DM, heart disease, HLD, HTN disease (father, brother).   Social History: Currently smokes cigarettes 1/2 PPD. Drinks beer on rare occasion and denies illicit drug use.   Diet: Does not use salt at home, drinks 2 cups of caffeinated coffee every day.  Exercise: Yard work mostly. Leg pain limits other activity.  Home BP readings: Does not own a BP cuff.  Past Medical History:  Diagnosis Date  . Anemia    after tubal preg. - rec'd bld. transfusion    . Anxiety   . Arthritis   . CAD (coronary artery disease)    a. s/p CABG in 2014 // b. LHC 2/15: grafts patent; OM1 80% >> PCI: Xience DES to OM1 // c. Myoview 9/16: no ischemia or scar, EF 67; Low Risk // d. LHC 10/17: pLAD 60, D1 50, Lat D1 70, oLCx 40, mLCx stent patent, pRCA 90, mRCA 99, EF 65 >> Med Rx.  . Carotid artery disease ([place])    Carotid US 1/16: bilateral ICA 1-39 >> FU 08/2016 // Carotid US 1/18: 0-39% bilat ICA; Elevated blood flow  velocities in the subclavian arteries, bilaterally; FU 1 year.  . Diabetes mellitus ([place])    diagnosed - 1999  . Endometriosis   . 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 GERD (gastroesophageal reflux disease)    "I have lots of acid"   . History of echocardiogram    a. Echo 6/14 (done at [facility]):  Mild LVH, EF 65-60%, Gr 2 DD.  Marland Kitchen Hx of transfusion   . Hyperlipemia   . Hypertension   . 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   LIMA to LAD, SVG to RCA, EVH via right thigh  . Sleep apnea    uses CPAP q night - assessment, 2011- in [facility]     Current Outpatient Prescriptions on File Prior to Visit  Medication Sig Dispense Refill  . ALPRAZolam (XANAX) 0.5 MG tablet Take 0.5 mg by mouth daily.     Marland Kitchen amLODipine (NORVASC) 10 MG tablet Take 1 tablet by mouth  daily 30 tablet 0  . Ascorbic Acid (VITAMIN C) 1000 MG tablet Take 1,000 mg by mouth daily.    Marland Kitchen aspirin  81 MG tablet Take 81 mg by mouth daily.    . clopidogrel (PLAVIX) 75 MG tablet Take 1 tablet by mouth  nightly 30 tablet 0  . COENZYME Q10 PO Take 100 mg by mouth daily.    . DULoxetine (CYMBALTA) 30 MG capsule Take 30 mg by mouth daily.     Marland Kitchen ESTRACE VAGINAL 0.1 MG/GM vaginal cream INSERT 1 G TWICE A WEEK BY VAGINAL ROUTE FOR 90 DAYS.  3  . fenofibrate 160 MG tablet Take 160 mg by mouth at bedtime.     Marland Kitchen FOLIC ACID PO Take 8,184 mg by mouth daily.    Marland Kitchen glipiZIDE (GLUCOTROL) 10 MG tablet Take 10 mg by mouth 2 (two) times daily before a meal.    . HUMALOG 100 UNIT/ML injection AS DIRECTED 20 TO 30 UNITS TWICE DAILY SUBCUTANEOUS PER SLIDING SCALE  2  . insulin glargine (LANTUS) 100 UNIT/ML injection Inject 50 Units into the skin 2 (two) times daily.     . IRON PO Take 1 tablet by mouth daily.    . isosorbide mononitrate (IMDUR) 30 MG 24 hr tablet Take 1 tablet (30 mg total) by mouth daily. 90 tablet 3  . lisinopril (PRINIVIL,ZESTRIL) 20 MG tablet Take 1 tablet (20 mg total) by mouth 2 (two) times daily. 180 tablet 3  . metformin  (FORTAMET) 1000 MG (OSM) 24 hr tablet Take 1,000 mg by mouth 2 (two) times daily.    . methenamine (HIPREX) 1 g tablet Take 1 g by mouth 2 (two) times daily.    . metoprolol (LOPRESSOR) 50 MG tablet Take 1.5 tablets (75 mg total) by mouth 2 (two) times daily. 270 tablet 1  . nitroGLYCERIN (NITROSTAT) 0.4 MG SL tablet Place 1 tablet (0.4 mg total) under the tongue every 5 (five) minutes as needed for chest pain. (Patient not taking: Reported on 10/22/2016) 25 tablet 3  . ONE TOUCH ULTRA TEST test strip 1 each by Other route daily.     . pantoprazole (PROTONIX) 40 MG tablet Take 40 mg by mouth daily as needed (INDIGESTION).    Marland Kitchen potassium gluconate (SM POTASSIUM) 595 (99 K) MG TABS tablet Take 595 mg by mouth daily.    . rosuvastatin (CRESTOR) 20 MG tablet Take 1 tablet (20 mg total) by mouth daily. 90 tablet 3  . trimethoprim (TRIMPEX) 100 MG tablet Take 50 mg by mouth daily.     No current facility-administered medications on file prior to visit.     No Known Allergies  Assessment/Plan:  1. Hypertension - BP improved and very close to goal <130/65mmHg at 132/27mmHg today. Will continue current medications. Pt advised to check her BP occasionally at home and to call clinic with any consistently elevated BP readings.   Darlene Berry, PharmD, CPP, [place] [address], [place], [place] [address] Phone: [phone]; Fax: [phone] 11/26/2016 11:26 AM

## 2016-11-26 NOTE — Patient Instructions (Signed)
Your blood pressure was at goal today - 130/80 or less.  Continue taking your current medications.  Check your blood pressure at home and call clinic if you notice consistently elevated readings.

## 2017-01-15 ENCOUNTER — Ambulatory Visit (INDEPENDENT_AMBULATORY_CARE_PROVIDER_SITE_OTHER): Payer: Medicare Other | Admitting: Cardiovascular Disease

## 2017-01-15 ENCOUNTER — Encounter: Payer: Self-pay | Admitting: Cardiovascular Disease

## 2017-01-15 VITALS — BP 130/60 | HR 69 | Ht 62.0 in | Wt 169.0 lb

## 2017-01-15 DIAGNOSIS — I2510 Atherosclerotic heart disease of native coronary artery without angina pectoris: Secondary | ICD-10-CM | POA: Diagnosis not present

## 2017-01-15 DIAGNOSIS — I10 Essential (primary) hypertension: Secondary | ICD-10-CM | POA: Diagnosis not present

## 2017-01-15 NOTE — Progress Notes (Signed)
Cardiology Office Note Date:  01/15/2017   ID:  Darlene, Berry Jan 16, 1951, MRN [id number]  PCP:  Candi Leash, PA-C  Cardiologist:  [name], MD    Chief Complaint  Patient presents with  . Follow-up     History of Present Illness: Darlene Berry is a 66 y.o. female who presents for Follow-up of coronary artery disease. The patient underwent multivessel CABG in 2014. Comorbid medical conditions include type 2 diabetes, hypertension, hyperlipidemia, and carotid stenosis with history of left carotid endarterectomy. Patient underwent stenting of the native left circumflex after her bypass surgery when she presented with recurrent angina in 2015. She underwent repeat cardiac catheterization in October 2017 when she presented with progressive anginal symptoms in the context of poorly controlled blood pressure. This study demonstrated patency of the LIMA to LAD, saphenous vein graft RCA, and left circumflex stents. Ongoing medical therapy was recommended.  Over the last several months the patient's antihypertensive regimen has been adjusted. Her blood pressure has been much better controlled. She has not had any chest pain or pressure. No shortness of breath. About 6 weeks ago she suddenly lost hearing in her left ear. 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 She's been evaluated by an ENT physician and apparently there is an imaging study arranged for tomorrow. She also complains of dizziness and gait unsteadiness.  Past Medical History:  Diagnosis Date  . Anemia    after tubal preg. - rec'd bld. transfusion    . Anxiety   . Arthritis   . CAD (coronary artery disease)    a. s/p CABG in 2014 // b. LHC 2/15: grafts patent; OM1 80% >> PCI: Xience DES to OM1 // c. Myoview 9/16: no ischemia or scar, EF 67; Low Risk // d. LHC 10/17: pLAD 60, D1 50, Lat D1 70, oLCx 40, mLCx stent patent, pRCA 90, mRCA 99,  EF 65 >> Med Rx.  . Carotid artery disease ([place])    Carotid US 1/16: bilateral ICA 1-39 >> FU 08/2016 // Carotid US 1/18: 0-39% bilat ICA; Elevated blood flow velocities in the subclavian arteries, bilaterally; FU 1 year.  . Diabetes mellitus ([place])    diagnosed - 1999  . Endometriosis   . 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 GERD (gastroesophageal reflux disease)    "I have lots of acid"   . History of echocardiogram    a. Echo 6/14 ([facility]):  Mild LVH, EF 65-60%, Gr 2 DD.  Marland Kitchen Hx of transfusion   . Hyperlipemia   . Hypertension   . 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   LIMA to LAD, SVG to RCA, EVH via right thigh  . Sleep apnea    uses CPAP q night - assessment, 2011- in [place]     Past Surgical History:  Procedure Laterality Date  . ABDOMINAL HYSTERECTOMY    . APPENDECTOMY    . BREAST SURGERY     R breast- biopsy- benign   . CARDIAC CATHETERIZATION     x 3  . CARDIAC CATHETERIZATION N/A 06/05/2016   Procedure: Left Heart Cath and Cors/Grafts Angiography;  Surgeon: [name], MD;  Location: [place] [facility];  Service: Cardiovascular;  Laterality: N/A;  . CAROTID ENDARTERECTOMY Left   . CORONARY ARTERY BYPASS GRAFT N/A 03/10/2013   Procedure: CORONARY ARTERY BYPASS GRAFTING (CABG);  Surgeon: Rexene Alberts, MD;  Location: [place];  Service: Open Heart Surgery;  Laterality: N/A;  x2 using right greater saphenous vein and left internal mammary.   . CORONARY STENT PLACEMENT  09/30/2013   DES  LEFT CIRCUMFLEX    DR Deeanna Beightol  . ENDOVEIN HARVEST OF GREATER SAPHENOUS VEIN Right 03/10/2013   Procedure: ENDOVEIN HARVEST OF GREATER SAPHENOUS VEIN;  Surgeon: Rexene Alberts, MD;  Location: [place];  Service: Open Heart Surgery;  Laterality: Right;  . FRACTURE SURGERY     L foot- /w screw   . INTRAOPERATIVE TRANSESOPHAGEAL ECHOCARDIOGRAM N/A 03/10/2013   Procedure: INTRAOPERATIVE TRANSESOPHAGEAL ECHOCARDIOGRAM;  Surgeon: Rexene Alberts, MD;  Location: [place];  Service: Open  Heart Surgery;  Laterality: N/A;  . LEFT HEART CATHETERIZATION WITH CORONARY/GRAFT ANGIOGRAM N/A 09/30/2013   Procedure: LEFT HEART CATHETERIZATION WITH Beatrix Fetters;  Surgeon: Blane Ohara, MD;  Location: [facility] CATH LAB;  Service: Cardiovascular;  Laterality: N/A;  . SHOULDER SURGERY Left   . TUBAL LIGATION      Current Outpatient Prescriptions  Medication Sig Dispense Refill  . ALPRAZolam (XANAX) 0.5 MG tablet Take 0.5 mg by mouth daily.     Marland Kitchen amLODipine (NORVASC) 10 MG tablet Take 1 tablet by mouth  daily 30 tablet 0  . Ascorbic Acid (VITAMIN C) 1000 MG tablet Take 1,000 mg by mouth daily.    Marland Kitchen aspirin 81 MG tablet Take 81 mg by mouth daily.    . clopidogrel (PLAVIX) 75 MG tablet Take 1 tablet by mouth  nightly 30 tablet 0  . COENZYME Q10 PO Take 100 mg by mouth daily.    . DULoxetine (CYMBALTA) 30 MG capsule Take 30 mg by mouth daily.     Marland Kitchen ESTRACE VAGINAL 0.1 MG/GM vaginal cream INSERT 1 G TWICE A WEEK BY VAGINAL ROUTE FOR 90 DAYS.  3  . fenofibrate 160 MG tablet Take 160 mg by mouth at bedtime.     Marland Kitchen FOLIC ACID PO Take 5,956 mg by mouth daily.    Marland Kitchen glipiZIDE (GLUCOTROL) 10 MG tablet Take 10 mg by mouth 2 (two) times daily before a meal.    . HUMALOG 100 UNIT/ML injection AS DIRECTED 20 TO 30 UNITS TWICE DAILY SUBCUTANEOUS PER SLIDING SCALE  2  . insulin glargine (LANTUS) 100 UNIT/ML injection Inject 50 Units into the skin 2 (two) times daily.     . IRON PO Take 1 tablet by mouth daily.    . isosorbide mononitrate (IMDUR) 30 MG 24 hr tablet Take 1 tablet (30 mg total) by mouth daily. 90 tablet 3  . lisinopril (PRINIVIL,ZESTRIL) 20 MG tablet Take 1 tablet (20 mg total) by mouth 2 (two) times daily. 180 tablet 3  . metformin (FORTAMET) 1000 MG (OSM) 24 hr tablet Take 1,000 mg by mouth 2 (two) times daily.    . methenamine (HIPREX) 1 g tablet Take 1 g by mouth 2 (two) times daily.    . metoprolol (LOPRESSOR) 50 MG tablet Take 1.5 tablets (75 mg total) by mouth 2 (two) times  daily. 270 tablet 1  . nitroGLYCERIN (NITROSTAT) 0.4 MG SL tablet Place 1 tablet (0.4 mg total) under the tongue every 5 (five) minutes as needed for chest pain. 25 tablet 3  . ONE TOUCH ULTRA TEST test strip 1 each by Other route daily.     . pantoprazole (PROTONIX) 40 MG tablet Take 40 mg by mouth daily as needed (INDIGESTION).    Marland Kitchen potassium gluconate (SM POTASSIUM) 595 (  99 K) MG TABS tablet Take 595 mg by mouth daily.    . rosuvastatin (CRESTOR) 20 MG tablet Take 1 tablet (20 mg total) by mouth daily. 90 tablet 3  . trimethoprim (TRIMPEX) 100 MG tablet Take 50 mg by mouth daily.     No current facility-administered medications for this visit.     Allergies:   Patient has no known allergies.   Social History:  The patient  reports that she has been smoking Cigarettes.  She has been smoking about 0.50 packs per day. She has never used smokeless tobacco. She reports that she does not drink alcohol or use drugs.   Family History:  The patient's  family history includes Cancer in her mother; Diabetes in her brother and father; Heart disease in her brother and father; Hyperlipidemia in her brother and father; Hypertension in her brother and father; Leukemia in her sister.    ROS:  Please see the history of present illness.  Otherwise, review of systems is positive for hearing loss, cough, abdominal pain, diarrhea, dizziness, excessive sweating, excessive fatigue, balance problems.  All other systems are reviewed and negative.    PHYSICAL EXAM: VS:  BP 130/60   Pulse 69   Ht 5\' 2"  (1.575 m)   Wt 169 lb (76.7 kg)   BMI 30.91 kg/m  , BMI Body mass index is 30.91 kg/m. GEN: Well nourished, well developed, in no acute distress  HEENT: normal  Neck: no JVD, no masses. bilateral carotid bruits R>L Cardiac: RRR without murmur or gallop                Respiratory:  clear to auscultation bilaterally, normal work of breathing GI: soft, nontender, nondistended, + BS MS: no deformity or atrophy    Ext: no pretibial edema Skin: warm and dry, no rash Neuro:  Strength and sensation are intact Psych: euthymic mood, full affect  EKG:  EKG is ordered today. The ekg ordered today shows normal sinus rhythm 69 bpm, within normal limits  Recent Labs: 05/28/2016: Hemoglobin 14.9; Platelets 207 08/06/2016: BUN 18; Creat 0.89; Potassium 4.5; Sodium 133   Lipid Panel     Component Value Date/Time   CHOL 193 09/24/2013 1616   TRIG 229 (H) 09/24/2013 1616   HDL 37 (L) 09/24/2013 1616   CHOLHDL 5.2 09/24/2013 1616   VLDL 46 (H) 09/24/2013 1616   LDLCALC 110 (H) 09/24/2013 1616      Wt Readings from Last 3 Encounters:  01/15/17 169 lb (76.7 kg)  08/06/16 168 lb (76.2 kg)  06/21/16 172 lb 6.4 oz (78.2 kg)     Cardiac Studies Reviewed: Carotid Doppler 09-04-2016: Impressions Duplex imaging, with color Doppler, of the carotid arteries reveals heterogeneous plaque at the bilateral; bifurcations and proximal ICA's. ICA velocities are normal, bilaterally. The vertebral arteries are patent with antegrade flow, bilaterally. Elevated velocities in the subclavian arteries, bilaterally. Technologist Notes Plaque Plaque Examination Data cm/s cm/s Heterogeneous plaque, bilaterally. 1-39% stenosis in the ICA, bilaterally. Patent vertebral arteries with antegrade flow. Elevated blood flow velocities in the subclavian arteries, bilaterally. F/U in a year  Cath 06-05-2016: Conclusion   1. 3 vessel CAD 2. S/P CABG with continued patency of the LIMA-LAD and SVG-distal RCA 3. Continued patency of the stented segment in the left circumflex 4. Normal LV function  Recommendation: medical therapy for residual CAD, adjustment of antihypertensive Rx, risk reduction/tobacco cessation   ASSESSMENT AND PLAN: 1.  Coronary artery disease with angina: Appear stable on her current medical  program. No changes recommended today.  2. Carotid artery stenosis status post carotid endarterectomy: Recent  carotid Doppler reviewed. Patient is on antiplatelet therapy with aspirin and Plavix. Continue current risk reduction measures.  3. Hypertension: Blood pressure now well controlled. No medicine changes are made today.  4. Hypercholesterolemia: Treated with a high intensity statin drug using Crestor 20 mg daily.  5. Dizziness/gait unsteadiness: Patient's symptoms are worrisome especially as they are associated with sudden hearing loss. The patient is undergoing evaluation and it sounds like she will have an MRI tomorrow. If this is not arranged, I asked her to contact me later this week and I would be happy to order the test for her. She has significant vascular disease and is at risk for stroke. Her medical therapy seems to be maximized with dual antiplatelet therapy, multi drug therapy for hypertension with blood pressure at goal, and a high intensity statin drug.  Current medicines are reviewed with the patient today.  The patient does not have concerns regarding medicines.  Labs/ tests ordered today include:  No orders of the defined types were placed in this encounter.   Disposition:   FU 6 months Richardson Dopp and one year with me  Signed, [name], MD  01/15/2017 5:39 PM    [address], [place], [place]  [address] Phone: [phone]; Fax: [phone]

## 2017-01-15 NOTE — Patient Instructions (Signed)
Medication Instructions:  Your physician recommends that you continue on your current medications as directed. Please refer to the Current Medication list given to you today.  Labwork: No new orders.   Testing/Procedures: No new orders.   Follow-Up: Your physician recommends that you schedule a follow-up appointment in: 6 MONTHS with Richardson Dopp PA-C  Your physician wants you to follow-up in: 1 YEAR with Dr Burt Knack.  You will receive a reminder letter in the mail two months in advance. If you don't receive a letter, please call our office to schedule the follow-up appointment.   Any Other Special Instructions Will Be Listed Below (If Applicable).  Please call our office at [phone] on Friday morning if an MRI is not ordered during your evaluation tomorrow.    If you need a refill on your cardiac medications before your next appointment, please call your pharmacy.

## 2017-01-17 ENCOUNTER — Telehealth: Payer: Self-pay | Admitting: Cardiovascular Disease

## 2017-01-17 NOTE — Telephone Encounter (Signed)
CT head performed yesterday by ENT. No abnormalities seen per pt.  The pt would like to know if she needs to have MRI performed. I will speak with Dr Burt Knack to determine if further testing is needed.

## 2017-01-17 NOTE — Telephone Encounter (Signed)
°  New Prob  Pt was evaluated by Dr. Burt Knack 2 days ago. States she was told she needs to have an MRI per Dr. Burt Knack. Pt was then evaluated by her ENT Dr. Wilburn Mylar 5/24 and CT performed at that time. Requesting to speak to nurse regarding MRI order and how to move forward. Please call.

## 2017-01-17 NOTE — Telephone Encounter (Signed)
Discussed with Dr Burt Knack and since CT scan was performed yesterday an MRI would not be necessary.  Pt aware. She is scheduled to see the ENT again in 1 month.

## 2017-03-08 ENCOUNTER — Other Ambulatory Visit: Payer: Self-pay | Admitting: Cardiovascular Disease

## 2017-05-14 ENCOUNTER — Other Ambulatory Visit: Payer: Self-pay | Admitting: Cardiovascular Disease

## 2017-05-14 DIAGNOSIS — I10 Essential (primary) hypertension: Secondary | ICD-10-CM

## 2017-06-04 ENCOUNTER — Encounter: Payer: Self-pay | Admitting: Physician Assistant

## 2017-07-17 ENCOUNTER — Other Ambulatory Visit: Payer: Self-pay | Admitting: Cardiovascular Disease

## 2017-07-22 ENCOUNTER — Ambulatory Visit: Payer: Medicare Other | Admitting: Physician Assistant

## 2017-09-02 ENCOUNTER — Encounter: Payer: Self-pay | Admitting: Physician Assistant

## 2017-09-02 ENCOUNTER — Ambulatory Visit: Payer: Medicare Other | Admitting: Physician Assistant

## 2017-09-02 VITALS — BP 128/54 | HR 76 | Ht 62.0 in | Wt 175.8 lb

## 2017-09-02 DIAGNOSIS — E7800 Pure hypercholesterolemia, unspecified: Secondary | ICD-10-CM

## 2017-09-02 DIAGNOSIS — I2510 Atherosclerotic heart disease of native coronary artery without angina pectoris: Secondary | ICD-10-CM

## 2017-09-02 DIAGNOSIS — I739 Peripheral vascular disease, unspecified: Secondary | ICD-10-CM | POA: Diagnosis not present

## 2017-09-02 DIAGNOSIS — I10 Essential (primary) hypertension: Secondary | ICD-10-CM | POA: Diagnosis not present

## 2017-09-02 DIAGNOSIS — R9431 Abnormal electrocardiogram [ECG] [EKG]: Secondary | ICD-10-CM

## 2017-09-02 NOTE — Progress Notes (Signed)
Cardiology Office Note:    Date:  09/02/2017   ID:  Darlene Berry, DOB 01-30-1951, MRN [id number]  PCP:  Darlene Leash, PA-C  Cardiologist:  Darlene Mocha, MD   Referring MD: Darlene Leash, PA-C   Chief Complaint  Patient presents with  . Coronary Artery Disease    Follow-up    History of Present Illness:    Darlene Berry is a 68 y.o. female with a hx of CAD s/pCABG in 2014, diabetes, HTN, HL, carotid artery disease s/p LCEA. She underwent LHC in 2/15 for unstable angina. Bypass grafts were patent but she had severe stenosis of a nongrafted LCx which was treated with a DES. Myoview in 9/16 demonstrated no ischemia or scar. In 10/17, she complained of chest pain with exertion that seemed to be getting worse.  Cardiac Catheterization demonstrated patent L-LAD, patient S-RCA and patent LCx stent.  Medical Rx was recommended.  She was seen in the HTN several times.  When last seen by Dr. Sherren Berry in May 2018, her blood pressure was better controlled.    Darlene Berry returns for follow-up.  Generally, she has been doing well.  She does note that she gets some episodes of dyspnea as well as chest discomfort if her sugar goes up above 300.  Once her sugar improves, she feels better.  She otherwise denies exertional chest pain or significant changes in her chronic dyspnea with exertion.  She denies PND or significant edema.  She denies syncope.  She denies any bleeding issues.  She has seen vascular surgery at [facility].  She needs to undergo left CFA and SFA endarterectomy and patch angioplasty due to claudication.  Prior CV studies:   The following studies were reviewed today:  Carotid US 1/18 1-39% stenosis in the ICA, bilaterally  >>  F/U in a year  LHC 06/05/16 LAD prox 60, D1 50; Lat D1 70 LCx ostial 40, mid stent patent RCA prox 90, mid 99 L-LAD patent S-RCA patent EF 65% Med Rx continued  Myoview 9/16 EF 67%, breast attenuation, no ischemia or scar; Low  Risk  Carotid US 1/16 Bilateral ICA 1-39% - FU 2 years  Echo (01/2013 at [facility]):  Mild LVH, EF 65-60%, Gr 2 DD.  LHC 2/15 LM: Patent LAD: Proximal 50%, mid 50%, diffuse 70% before LIMA insertion, first septal perforator with marked intramyocardial bridging, ostial D1 70% LCx: Proximal 40-50%, OM1 80% RCA: Mid 70% SVG-RCA: Patent LIMA-LAD: Patent EF 65% PCI: 2.25 x 15 mm Xience DES to the OM1   Past Medical History:  Diagnosis Date  . Anemia    after tubal preg. - rec'd bld. transfusion    . Anxiety   . Arthritis   . CAD (coronary artery disease)    a. s/p CABG in 2014 // b. LHC 2/15: grafts patent; OM1 80% >> PCI: Xience DES to OM1 // c. Myoview 9/16: no ischemia or scar, EF 67; Low Risk // d. LHC 10/17: pLAD 60, D1 50, Lat D1 70, oLCx 40, mLCx stent patent, pRCA 90, mRCA 99, EF 65 >> Med Rx.  . Carotid artery disease ([place])    Carotid US 1/16: bilateral ICA 1-39 >> FU 08/2016 // Carotid US 1/18: 0-39% bilat ICA; Elevated blood flow velocities in the subclavian arteries, bilaterally; FU 1 year.  . Diabetes mellitus ([place])    diagnosed - 1999  . Endometriosis   . 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 GERD (gastroesophageal reflux  disease)    "I have lots of acid"   . History of echocardiogram    a. Echo 6/14 ([facility] at [facility]):  Mild LVH, EF 65-60%, Gr 2 DD.  Marland Kitchen Hx of transfusion   . Hyperlipemia   . Hypertension   . 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   LIMA to LAD, SVG to RCA, EVH via right thigh  . Sleep apnea    uses CPAP q night - assessment, 2011- in [place]     Past Surgical History:  Procedure Laterality Date  . ABDOMINAL HYSTERECTOMY    . APPENDECTOMY    . BREAST SURGERY     R breast- biopsy- benign   . CARDIAC CATHETERIZATION     x 3  . CARDIAC CATHETERIZATION N/A 06/05/2016   Procedure: Left Heart Cath and Cors/Grafts Angiography;  Surgeon: Darlene Mocha, MD;  Location: [place] [facility];  Service: Cardiovascular;  Laterality: N/A;  . CAROTID  ENDARTERECTOMY Left   . CORONARY ARTERY BYPASS GRAFT N/A 03/10/2013   Procedure: CORONARY ARTERY BYPASS GRAFTING (CABG);  Surgeon: Rexene Alberts, MD;  Location: [place];  Service: Open Heart Surgery;  Laterality: N/A;  x2 using right greater saphenous vein and left internal mammary.   . CORONARY STENT PLACEMENT  09/30/2013   DES  LEFT CIRCUMFLEX    DR COOPER  . ENDOVEIN HARVEST OF GREATER SAPHENOUS VEIN Right 03/10/2013   Procedure: ENDOVEIN HARVEST OF GREATER SAPHENOUS VEIN;  Surgeon: Rexene Alberts, MD;  Location: [place];  Service: Open Heart Surgery;  Laterality: Right;  . FRACTURE SURGERY     L foot- /w screw   . INTRAOPERATIVE TRANSESOPHAGEAL ECHOCARDIOGRAM N/A 03/10/2013   Procedure: INTRAOPERATIVE TRANSESOPHAGEAL ECHOCARDIOGRAM;  Surgeon: Rexene Alberts, MD;  Location: [place];  Service: Open Heart Surgery;  Laterality: N/A;  . LEFT HEART CATHETERIZATION WITH CORONARY/GRAFT ANGIOGRAM N/A 09/30/2013   Procedure: LEFT HEART CATHETERIZATION WITH Beatrix Fetters;  Surgeon: Blane Ohara, MD;  Location: [facility];  Service: Cardiovascular;  Laterality: N/A;  . SHOULDER SURGERY Left   . TUBAL LIGATION      Current Medications: Current Meds  Medication Sig  . ALPRAZolam (XANAX) 0.5 MG tablet Take 0.5 mg by mouth daily.   Marland Kitchen amLODipine (NORVASC) 10 MG tablet Take 1 tablet by mouth  daily  . Ascorbic Acid (VITAMIN C) 1000 MG tablet Take 1,000 mg by mouth daily.  Marland Kitchen aspirin 81 MG tablet Take 81 mg by mouth daily.  . clopidogrel (PLAVIX) 75 MG tablet Take 1 tablet by mouth  nightly  . COENZYME Q10 PO Take 100 mg by mouth daily.  . DULoxetine (CYMBALTA) 30 MG capsule Take 30 mg by mouth daily.   Marland Kitchen ESTRACE VAGINAL 0.1 MG/GM vaginal cream INSERT 1 G TWICE A WEEK BY VAGINAL ROUTE FOR 90 DAYS.  . fenofibrate 160 MG tablet Take 160 mg by mouth at bedtime.   Marland Kitchen FOLIC ACID PO Take 2,703 mg by mouth daily.  Marland Kitchen glipiZIDE (GLUCOTROL) 10 MG tablet Take 10 mg by mouth 2 (two) times daily before a  meal.  . HUMALOG 100 UNIT/ML injection AS DIRECTED 20 TO 30 UNITS TWICE DAILY SUBCUTANEOUS PER SLIDING SCALE  . insulin glargine (LANTUS) 100 UNIT/ML injection Inject 50 Units into the skin 2 (two) times daily.   . IRON PO Take 1 tablet by mouth daily.  . isosorbide mononitrate (IMDUR) 30 MG 24 hr tablet TAKE 1 TABLET BY MOUTH  DAILY  . lisinopril (PRINIVIL,ZESTRIL) 20 MG tablet Take  1 tablet (20 mg total) by mouth 2 (two) times daily.  . metformin (FORTAMET) 1000 MG (OSM) 24 hr tablet Take 1,000 mg by mouth 2 (two) times daily.  . methenamine (HIPREX) 1 g tablet Take 1 g by mouth 2 (two) times daily.  . metoprolol tartrate (LOPRESSOR) 50 MG tablet TAKE 1 AND 1/2 TABLETS BY  MOUTH TWO TIMES DAILY  . nitroGLYCERIN (NITROSTAT) 0.4 MG SL tablet Place 1 tablet (0.4 mg total) under the tongue every 5 (five) minutes as needed for chest pain.  . ONE TOUCH ULTRA TEST test strip 1 each by Other route daily.   . pantoprazole (PROTONIX) 40 MG tablet Take 40 mg by mouth daily as needed (INDIGESTION).  Marland Kitchen potassium gluconate (SM POTASSIUM) 595 (99 K) MG TABS tablet Take 595 mg by mouth daily.  . rosuvastatin (CRESTOR) 20 MG tablet TAKE 1 TABLET BY MOUTH  DAILY     Allergies:   Patient has no known allergies.   Social History   Tobacco Use  . Smoking status: Current Some Day Smoker    Packs/day: 0.50    Types: Cigarettes    Last attempt to quit: 03/10/2013    Years since quitting: 4.4  . Smokeless tobacco: Never Used  Substance Use Topics  . Alcohol use: No    Comment: beer- rare occasion   . Drug use: No     Family Hx: The patient's family history includes Cancer in her mother; Diabetes in her brother and father; Heart disease in her brother and father; Hyperlipidemia in her brother and father; Hypertension in her brother and father; Leukemia in her sister.  ROS:   Please see the history of present illness.    Review of Systems  Musculoskeletal: Positive for back pain and myalgias.    Neurological: Positive for dizziness and loss of balance.   All other systems reviewed and are negative.   EKGs/Labs/Other Test Reviewed:    EKG:  EKG is  ordered today.  The ekg ordered today demonstrates NSR, HR 76, normal axis, ST depression with downsloping T wave inversions 1, aVL, septal Q waves, QTC 434 ms  Recent Labs: No results found for requested labs within last 8760 hours.   Recent Lipid Panel Lab Results  Component Value Date/Time   CHOL 193 09/24/2013 04:16 PM   TRIG 229 (H) 09/24/2013 04:16 PM   HDL 37 (L) 09/24/2013 04:16 PM   CHOLHDL 5.2 09/24/2013 04:16 PM   LDLCALC 110 (H) 09/24/2013 04:16 PM    Physical Exam:    VS:  BP (!) 128/54   Pulse 76   Ht 5\' 2"  (1.575 m)   Wt 175 lb 12.8 oz (79.7 kg)   SpO2 97%   BMI 32.15 kg/m     Wt Readings from Last 3 Encounters:  09/02/17 175 lb 12.8 oz (79.7 kg)  01/15/17 169 lb (76.7 kg)  08/06/16 168 lb (76.2 kg)     Physical Exam  Constitutional: She is oriented to person, place, and time. She appears well-developed and well-nourished. No distress.  HENT:  Head: Normocephalic and atraumatic.  Eyes: No scleral icterus.  Neck: No JVD present.  Cardiovascular: Normal rate and regular rhythm.  No murmur heard. Pulmonary/Chest: Effort normal. She has no rales.  Abdominal: Soft. She exhibits no distension.  Musculoskeletal: She exhibits no edema.  Neurological: She is alert and oriented to person, place, and time.  Skin: Skin is warm and dry.    ASSESSMENT:    1. Coronary artery disease  involving native coronary artery of native heart without angina pectoris   2. PAD (peripheral artery disease) ([place])   3. Abnormal EKG   4. Essential hypertension   5. Pure hypercholesterolemia    PLAN:    In order of problems listed above:  1.  Coronary artery disease History of bypass in 2014 and drug-eluting stent to the LCx in 2015.  Cardiac catheterization October 2017 demonstrated patent bypass grafts and patent  stent in the LCx.  She has noted some chest symptoms and shortness of breath with elevated blood sugars.  Otherwise, she has overall been stable from a symptomatic standpoint.  She does have T wave inversions noted in 1 aVL on her ECG.  I reviewed her case today with Dr. Irish Lack (Dr. of the day).  We reviewed her ECG and her symptoms.  Given her findings on cardiac catheterization in October 2017 and fairly stable symptoms, we do not think that she needs further testing at this time.  She knows to return sooner if her symptoms should change.  Continue aspirin, Plavix, nitrates, beta-blocker, ACE inhibitor, statin.  2.  PAD (peripheral artery disease) ([place]) She has a history of peripheral arterial disease with claudication.  She is seeing a vascular surgeon at [facility] who plans to proceed with left common femoral and superficial femoral artery endarterectomy and patch angioplasty.  At this point, she does not require further cardiovascular testing.  3.  Abnormal EKG As noted, she does have T wave inversions in 1 and aVL on her ECG.  Her T waves do look a little peaked in other leads.  As noted, I discussed her case with Dr. Irish Lack and reviewed her ECG.  We will obtain labs today to rule out metabolic abnormality (basic metabolic panel, magnesium).  We will also have her undergo repeat ECG in 1 week to compare with today's tracing.  4.  Essential hypertension The patient's blood pressure is controlled on her current regimen.  Continue current therapy.   5.  Pure hypercholesterolemia Continue statin.   Dispo:  Return in about 3 months (around 12/01/2017) for Close Follow Up, w/ Dr. Burt Knack.   Medication Adjustments/Labs and Tests Ordered: Current medicines are reviewed at length with the patient today.  Concerns regarding medicines are outlined above.  Tests Ordered: Orders Placed This Encounter  Procedures  . Basic Metabolic Panel (BMET)  . Magnesium  . EKG 12-Lead   Medication Changes: No  orders of the defined types were placed in this encounter.   Signed, Richardson Dopp, PA-C  09/02/2017 4:21 PM    [facility] [address], [place], [place]  [id number] Phone: [phone]; Fax: [phone]

## 2017-09-02 NOTE — Patient Instructions (Addendum)
Medication Instructions:  1. Your physician recommends that you continue on your current medications as directed. Please refer to the Current Medication list given to you today.   Labwork: 1. TODAY BMET, MAGNESIUM LEVEL  Testing/Procedures: PER SCOTT WEAVER, PAC HE WOULD LIKE FOR YOU TO HAVE AN EKG DONE WITH YOUR PRIMARY CARE PHYSICIAN IN 1 WEEK; PLEASE HAVE THE RESULTS FAXED TO [place], [place]  Follow-Up: DR. Burt Knack April 11/2017 @ 2:20   Any Other Special Instructions Will Be Listed Below (If Applicable).     If you need a refill on your cardiac medications before your next appointment, please call your pharmacy.

## 2017-09-03 ENCOUNTER — Telehealth: Payer: Self-pay | Admitting: *Deleted

## 2017-09-03 LAB — BASIC METABOLIC PANEL
BUN/Creatinine Ratio: 26 (ref 12–28)
BUN: 28 mg/dL — ABNORMAL HIGH (ref 8–27)
CO2: 23 mmol/L (ref 20–29)
Calcium: 10.3 mg/dL (ref 8.7–10.3)
Chloride: 95 mmol/L — ABNORMAL LOW (ref 96–106)
Creatinine, Ser: 1.07 mg/dL — ABNORMAL HIGH (ref 0.57–1.00)
GFR calc Af Amer: 62 mL/min/{1.73_m2} (ref >59)
GFR calc non Af Amer: 54 mL/min/{1.73_m2} — ABNORMAL LOW (ref >59)
Glucose: 304 mg/dL — ABNORMAL HIGH (ref 65–99)
Potassium: 5 mmol/L (ref 3.5–5.2)
Sodium: 137 mmol/L (ref 134–144)

## 2017-09-03 LAB — MAGNESIUM: Magnesium: 1.6 mg/dL (ref 1.6–2.3)

## 2017-09-03 NOTE — Telephone Encounter (Signed)
Follow up     Patient returning call for results

## 2017-09-03 NOTE — Telephone Encounter (Signed)
lmtcb to go over lab results and recommendations.

## 2017-09-03 NOTE — Telephone Encounter (Signed)
-----   Message from Liliane Shi, PA-C sent at 09/03/2017  9:26 AM EST ----- Please call the patient Glucose too high.  Renal function stable.  BUN elevated-question mild dehydration.  Potassium normal.  Magnesium borderline low. PLAN: 1.  Follow-up with PCP for better control of diabetes - fax labs to PCP 2.  Push oral fluids 3.  Increase dietary magnesium 4.  Repeat BMET, magnesium 2-3 weeks Richardson Dopp, PA-C 09/03/2017 9:24 AM

## 2017-09-03 NOTE — Telephone Encounter (Signed)
Pt has been notified of lab results. Pt agreeable to increase fluid intake and increase dietary Magnesium, as well as to follow up with PCP in regards to her DM. Pt advised need to repeat bmet, magnesium lab work in 2-3 weeks. Pt states she will have this done with her PCP and have results faxed to our office. Pt advised to increase fluid intake as well, increase H20.

## 2017-09-12 ENCOUNTER — Telehealth: Payer: Self-pay | Admitting: Cardiovascular Disease

## 2017-09-12 NOTE — Telephone Encounter (Signed)
Pt was calling to see if we did receive the EKG that she had done with PCP. I s/w pt and confirmed EKG received, though the doctor has not read the EKG yet. I stated to the pt that I will call her once the doctor has read the EKG. Pt thanked me for my call.

## 2017-09-12 NOTE — Telephone Encounter (Signed)
New Message    Patient calling to see if you received the results for the 2nd ekg she had done at [facility]?

## 2017-09-15 NOTE — Telephone Encounter (Signed)
Pt saw Richardson Dopp, [place] on 09/02/17 which at that time we had ordered for her to have a repeat EKG in 1 week with PCP.  I called the pt to let her know that Ellen Henri, [facility] reviewed EKG that was sent over on Friday 1/118/19. Pt advised per Ellen Henri, PA EKG stable compared to EKG done on 09/02/17. I did tell pt that I will show EKG to d/w Richardson Dopp, PA for his review as well since he is the provider who ordered the EKG. I advised if he has any other comments or changes to be made I will let her know. Pt thanked me for my call today.

## 2017-09-16 NOTE — Telephone Encounter (Signed)
Reviewed ECG from 09/11/17. No significant change. Continue current medications and follow up as planned.  Follow up sooner if there is a change in symptoms (ie - chest pain or shortness of breath).   Richardson Dopp, PA-C    09/16/2017 12:45 PM

## 2017-10-12 ENCOUNTER — Other Ambulatory Visit: Payer: Self-pay | Admitting: Physician Assistant

## 2017-10-12 ENCOUNTER — Other Ambulatory Visit: Payer: Self-pay | Admitting: Cardiovascular Disease

## 2017-10-12 DIAGNOSIS — I10 Essential (primary) hypertension: Secondary | ICD-10-CM

## 2017-11-27 ENCOUNTER — Ambulatory Visit: Payer: Medicare Other | Admitting: Cardiovascular Disease

## 2017-12-24 ENCOUNTER — Other Ambulatory Visit: Payer: Self-pay | Admitting: Cardiovascular Disease

## 2018-03-11 ENCOUNTER — Telehealth: Payer: Self-pay | Admitting: Cardiovascular Disease

## 2018-03-11 NOTE — Telephone Encounter (Signed)
   [facility] Pre-operative Risk Assessment    Request for surgical clearance:  1. What type of surgery is being performed? RT shoulder scope SAD RCR repair anterior labrum   2. When is this surgery scheduled? 03/26/18   3. What type of clearance is required (medical clearance vs. Pharmacy clearance to hold med vs. Both)? Both  4. Are there any medications that need to be held prior to surgery and how long? Plavix, for 7 days    5. Practice name and name of physician performing surgery? [facility], Dr. Waynetta Sandy    6. What is your office phone number [phone]    7.   What is your office fax number [phone]  8.   Anesthesia type (None, local, MAC, general) ? Unknown   Darlene Berry 03/11/2018, 1:44 PM  _________________________________________________________________   (provider comments below)

## 2018-03-12 NOTE — Telephone Encounter (Signed)
Pt needs to see Dr. Burt Knack 03/16/18 then he will clear for surgery.    [place], MD  Chart reviewed as part of pre-operative protocol coverage. Because of Arshiya P Witherspoon's past medical history and appt 03/16/18 will need to keep appt to clear for surgery.  Pre-op covering staff: - Please call patient to inform them to keep appt.. - Please contact requesting surgeon's office via preferred method (i.e, phone, fax) to inform them of need for appointment prior to surgery.  Cecilie Kicks, NP  03/12/2018, 12:33 PM

## 2018-03-12 NOTE — Telephone Encounter (Signed)
ATTEMPT TO  CALL PT NO VOICEMAIL SET UP

## 2018-03-16 ENCOUNTER — Ambulatory Visit: Payer: Medicare Other | Admitting: Cardiovascular Disease

## 2018-03-16 ENCOUNTER — Encounter: Payer: Self-pay | Admitting: Cardiovascular Disease

## 2018-03-16 VITALS — BP 118/56 | HR 63 | Ht 62.0 in | Wt 163.0 lb

## 2018-03-16 DIAGNOSIS — I6523 Occlusion and stenosis of bilateral carotid arteries: Secondary | ICD-10-CM | POA: Diagnosis not present

## 2018-03-16 DIAGNOSIS — I2510 Atherosclerotic heart disease of native coronary artery without angina pectoris: Secondary | ICD-10-CM

## 2018-03-16 NOTE — Telephone Encounter (Signed)
Clearance faxed to: [phone]

## 2018-03-16 NOTE — Patient Instructions (Addendum)
Medication Instructions:  1) STOP ASPIRIN 2) HOLD PLAVIX for your procedure starting now. After your procedure, resume Plavix as instructed by surgeon.    Labwork: None  Testing/Procedures: None  Follow-Up: Dr. Delfin Gant works with [facility]. [phone] We'd like you to see him in about 6 months.  Any Other Special Instructions Will Be Listed Below (If Applicable).     If you need a refill on your cardiac medications before your next appointment, please call your pharmacy.

## 2018-03-16 NOTE — Telephone Encounter (Signed)
I will route to Dr. Antionette Char nurse Valetta Fuller.

## 2018-03-16 NOTE — Telephone Encounter (Signed)
Pt was seen today by Dr. Burt Knack. See Dr. Antionette Char note clearing pt.

## 2018-03-16 NOTE — Progress Notes (Signed)
Cardiology Office Note Date:  03/17/2018   ID:  Darlene Berry, Darlene Berry [birth year], MRN [id number]  PCP:  Candi Leash, PA-C  Cardiologist:  [name], MD    Chief Complaint  Patient presents with  . Follow-up  . Pre-op Exam     History of Present Illness: Darlene Berry is a 67 y.o. female who presents for follow-up of coronary artery disease.  The patient underwent multivessel CABG in 2014 for treatment of chronic angina with a background of diabetes, hypertension, and hyperlipidemia.  She also has a history of carotid artery stenosis status post left carotid endarterectomy.  She presented with unstable angina in 2015 and underwent stenting of a nongrafted native left circumflex with a drug-eluting stent.  Repeat cardiac catheterization in 2017 for evaluation of chest pain demonstrated continued patency of the LIMA to LAD, saphenous vein graft RCA, and native left circumflex stent.  Ongoing medical therapy was recommended.  She is here alone today. Since her last visit she had a mechanical fall and sustained a right shoulder injury that will require surgery scheduled for next week. She reports a torn rotator cuff and labrum.   She underwent left common femoral endarterectomy earlier this year and she states that this 'changed her life.' She is now able to walk without significant limitation.   She has not had any recent problems with chest discomfort or shortness of breath. No palpitations.  No edema, orthopnea, or PND.  Past Medical History:  Diagnosis Date  . Anemia    after tubal preg. - rec'd bld. transfusion    . Anxiety   . Arthritis   . CAD (coronary artery disease)    a. s/p CABG in 2014 // b. LHC 2/15: grafts patent; OM1 80% >> PCI: Xience DES to OM1 // c. Myoview 9/16: no ischemia or scar, EF 67; Low Risk // d. LHC 10/17: pLAD 60, D1 50, Lat D1 70, oLCx 40, mLCx stent patent, pRCA 90, mRCA 99, EF 65 >> Med Rx.  . Carotid artery disease ([place])    Carotid US 1/16:  bilateral ICA 1-39 >> FU 08/2016 // Carotid US 1/18: 0-39% bilat ICA; Elevated blood flow velocities in the subclavian arteries, bilaterally; FU 1 year.  . Diabetes mellitus ([place])    diagnosed - 1999  . Endometriosis   . 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 GERD (gastroesophageal reflux disease)    "I have lots of acid"   . History of echocardiogram    a. Echo 6/14 (done at [facility]):  Mild LVH, EF 65-60%, Gr 2 DD.  Marland Kitchen Hx of transfusion   . Hyperlipemia   . Hypertension   . 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   LIMA to LAD, SVG to RCA, EVH via right thigh  . Sleep apnea    uses CPAP q night - assessment, 2011- in [place]     Past Surgical History:  Procedure Laterality Date  . ABDOMINAL HYSTERECTOMY    . APPENDECTOMY    . BREAST SURGERY     R breast- biopsy- benign   . CARDIAC CATHETERIZATION     x 3  . CARDIAC CATHETERIZATION N/A 06/05/2016   Procedure: Left Heart Cath and Cors/Grafts Angiography;  Surgeon: [name], MD;  Location: [facility];  Service: Cardiovascular;  Laterality: N/A;  . CAROTID ENDARTERECTOMY Left   . CORONARY ARTERY BYPASS GRAFT N/A 03/10/2013   Procedure: CORONARY ARTERY BYPASS GRAFTING (CABG);  Surgeon:  Rexene Alberts, MD;  Location: [place];  Service: Open Heart Surgery;  Laterality: N/A;  x2 using right greater saphenous vein and left internal mammary.   . CORONARY STENT PLACEMENT  09/30/2013   DES  LEFT CIRCUMFLEX    DR Shaira Sova  . ENDOVEIN HARVEST OF GREATER SAPHENOUS VEIN Right 03/10/2013   Procedure: ENDOVEIN HARVEST OF GREATER SAPHENOUS VEIN;  Surgeon: Rexene Alberts, MD;  Location: [facility];  Service: Open Heart Surgery;  Laterality: Right;  . FRACTURE SURGERY     L foot- /w screw   . INTRAOPERATIVE TRANSESOPHAGEAL ECHOCARDIOGRAM N/A 03/10/2013   Procedure: INTRAOPERATIVE TRANSESOPHAGEAL ECHOCARDIOGRAM;  Surgeon: Rexene Alberts, MD;  Location: [facility] [facility];  Service: Open Heart Surgery;  Laterality: N/A;  . LEFT HEART CATHETERIZATION WITH  CORONARY/GRAFT ANGIOGRAM N/A 09/30/2013   Procedure: LEFT HEART CATHETERIZATION WITH Beatrix Fetters;  Surgeon: Blane Ohara, MD;  Location[facility];  Service: Cardiovascular;  Laterality: N/A;  . SHOULDER SURGERY Left   . TUBAL LIGATION      Current Outpatient Medications  Medication Sig Dispense Refill  . ALPRAZolam (XANAX) 0.5 MG tablet Take 0.5 mg by mouth daily.     Marland Kitchen amLODipine (NORVASC) 10 MG tablet Take 1 tablet by mouth  daily 30 tablet 0  . Ascorbic Acid (VITAMIN C) 1000 MG tablet Take 1,000 mg by mouth daily.    . clopidogrel (PLAVIX) 75 MG tablet Take 1 tablet by mouth  nightly 30 tablet 0  . COENZYME Q10 PO Take 100 mg by mouth daily.    . DULoxetine (CYMBALTA) 30 MG capsule Take 30 mg by mouth daily.     . fenofibrate 160 MG tablet Take 160 mg by mouth at bedtime.     Marland Kitchen glipiZIDE (GLUCOTROL) 10 MG tablet Take 10 mg by mouth 2 (two) times daily before a meal.    . HUMALOG 100 UNIT/ML injection AS DIRECTED 20 TO 30 UNITS TWICE DAILY SUBCUTANEOUS PER SLIDING SCALE  2  . insulin glargine (LANTUS) 100 UNIT/ML injection Inject 50 Units into the skin 2 (two) times daily.     . IRON PO Take 1 tablet by mouth daily.    . isosorbide mononitrate (IMDUR) 30 MG 24 hr tablet TAKE 1 TABLET BY MOUTH  DAILY 90 tablet 3  . lisinopril (PRINIVIL,ZESTRIL) 20 MG tablet TAKE 1 TABLET BY MOUTH TWO  TIMES DAILY 180 tablet 2  . metformin (FORTAMET) 1000 MG (OSM) 24 hr tablet Take 1,000 mg by mouth 2 (two) times daily.    . methenamine (HIPREX) 1 g tablet Take 1 g by mouth 2 (two) times daily.    . metoprolol tartrate (LOPRESSOR) 50 MG tablet TAKE 1 AND 1/2 TABLETS BY  MOUTH TWO TIMES DAILY 270 tablet 1  . nitroGLYCERIN (NITROSTAT) 0.4 MG SL tablet Place 1 tablet (0.4 mg total) under the tongue every 5 (five) minutes as needed for chest pain. 25 tablet 3  . ONE TOUCH ULTRA TEST test strip 1 each by Other route daily.     . pantoprazole (PROTONIX) 40 MG tablet Take 40 mg by mouth daily as  needed (INDIGESTION).    Marland Kitchen potassium gluconate (SM POTASSIUM) 595 (99 K) MG TABS tablet Take 595 mg by mouth daily.    . rosuvastatin (CRESTOR) 20 MG tablet TAKE 1 TABLET BY MOUTH  DAILY 90 tablet 1   No current facility-administered medications for this visit.     Allergies:   Patient has no known allergies.   Social History:  The patient  reports that she has been smoking cigarettes.  She has been smoking about 0.50 packs per day. She has never used smokeless tobacco. She reports that she does not drink alcohol or use drugs.   Family History:  The patient's family history includes Cancer in her mother; Diabetes in her brother and father; Heart disease in her brother and father; Hyperlipidemia in her brother and father; Hypertension in her brother and father; Leukemia in her sister.    ROS:  Please see the history of present illness.  Otherwise, review of systems is positive for hearing loss.  All other systems are reviewed and negative.   PHYSICAL EXAM: VS:  BP (!) 118/56   Pulse 63   Ht 5\' 2"  (1.575 m)   Wt 163 lb (73.9 kg)   BMI 29.81 kg/m  , BMI Body mass index is 29.81 kg/m. GEN: Well nourished, well developed, in no acute distress  HEENT: normal  Neck: no JVD, no masses. BL carotid bruits R>L Cardiac: RRR with 2/6 SEM at the RUSB   Respiratory:  clear to auscultation bilaterally, normal work of breathing GI: soft, nontender, nondistended, + BS MS: no deformity or atrophy  Ext: no pretibial edema Skin: warm and dry, no rash Neuro:  Strength and sensation are intact Psych: euthymic mood, full affect  EKG:  EKG is ordered today. The ekg ordered today shows normal sinus rhythm with first-degree AV block, heart rate 63 bpm, rightward axis, cannot rule out anterior infarct age undetermined.  Recent Labs: 09/02/2017: BUN 28; Creatinine, Ser 1.07; Magnesium 1.6; Potassium 5.0; Sodium 137   Lipid Panel     Component Value Date/Time   CHOL 193 09/24/2013 1616   TRIG 229 (H)  09/24/2013 1616   HDL 37 (L) 09/24/2013 1616   CHOLHDL 5.2 09/24/2013 1616   VLDL 46 (H) 09/24/2013 1616   LDLCALC 110 (H) 09/24/2013 1616      Wt Readings from Last 3 Encounters:  03/16/18 163 lb (73.9 kg)  09/02/17 175 lb 12.8 oz (79.7 kg)  01/15/17 169 lb (76.7 kg)     Cardiac Studies Reviewed: Cardiac Cath 06-05-2016: Conclusion   1. 3 vessel CAD 2. S/P CABG with continued patency of the LIMA-LAD and SVG-distal RCA 3. Continued patency of the stented segment in the left circumflex 4. Normal LV function  Recommendation: medical therapy for residual CAD, adjustment of antihypertensive Rx, risk reduction/tobacco cessation  Indications   Exertional angina ([place]) [I20.8 (ICD-10-CM)]  Procedural Details/Technique   Technical Details INDICATION: Exertional angina, despite maximal medical therapy. Known CAD s/p CABG (2014) and PCI (2015). CABG: LIMA-LAD, SVG PDA/PLA. PCI: DES mid-circumflex  PROCEDURAL DETAILS:  The right groin was prepped, draped, and anesthetized with 1% lidocaine. Using modified Seldinger technique, a 5 French sheath was introduced into the right femoral artery. Standard Judkins catheters were used for coronary angiography, bypass graft angiography, and left ventriculography. Catheter exchanges were performed over an 0.035 guidewire. There were no immediate procedural complications. The patient was transferred to the post catheterization recovery area for further monitoring.    Estimated blood loss <50 mL.  During this procedure the patient was administered the following to achieve and maintain moderate conscious sedation: Versed 4 mg, Fentanyl 75 mcg, while the patient's heart rate, blood pressure, and oxygen saturation were continuously monitored. The period of conscious sedation was 35 minutes, of which I was present face-to-face 100% of this time.  Coronary Findings   Diagnostic  Dominance: Right  Left Anterior Descending  Prox LAD lesion 60% stenosed    Prox LAD lesion.  First Diagonal Branch  1st Diag lesion 50% stenosed  1st Diag lesion.  Lateral First Diagonal Branch  Lat 1st Diag lesion 70% stenosed  moderate stenosis of the first diagonal bifurcation  Left Circumflex  Ost Cx to Mid Cx lesion 40% stenosed  diffuse disease  Mid Cx lesion 0% stenosed  Previously placed Mid Cx drug eluting stent is widely patent.  Right Coronary Artery  Prox RCA to Mid RCA lesion 90% stenosed  Prox RCA to Mid RCA lesion.  Mid RCA to Dist RCA lesion 99% stenosed  Mid RCA to Dist RCA lesion.  LIMA LIMA Graft to Mid LAD  LIMA. LIMA-LAD widely patent  saphenous Graft to Dist RCA  SVG. Widely patent SVG-distal RCA, supplies both the PDA and PLA branches with mild stenosis of the PLA ostium not significantly changed from prior study.  Intervention   No interventions have been documented.  [place]              Left Heart   Left Ventricle The left ventricular systolic function is normal. The left ventricular ejection fraction is greater than 65% by visual estimate.  Coronary Diagrams   Diagnostic Diagram          ASSESSMENT AND PLAN: 1.  Coronary artery disease, native vessel, with angina: The patient's anginal symptoms are stable and minimal at present.  Her antianginal medical regimen includes amlodipine, isosorbide, and metoprolol.  No changes are made today.  Regarding her antiplatelet therapy, I advised that she is at acceptable risk of holding aspirin and clopidogrel for surgery.  She is now 2 years out from her last PCI procedure which was performed for stable CAD.  I would recommend resuming clopidogrel alone after surgery.  2.  Hypertension: Blood pressure is well controlled on current regimen.  No changes are made today.  She is on an ACE inhibitor in the setting of vascular disease, CAD, and diabetes.  3.  Carotid artery stenosis: The patient is followed by vascular surgery.  Plans noted for a duplex study later this year.   She has a prominent right carotid bruit.  4.  Hyperlipidemia: Treated with rosuvastatin 20 mg daily.  Most recent lipids reviewed.  5.  Peripheral arterial disease with intermittent claudication: Followed by vascular surgery.  Marked symptomatic improvement since her left common femoral and SFA endarterectomy.  Disposition: The patient lives in [place], [place].  She does not like driving to [place].  She prefers to follow-up in [place] if possible.  I have recommended Delfin Gant, MD if he is taking new patients.  Current medicines are reviewed with the patient today.  The patient does not have concerns regarding medicines.  Labs/ tests ordered today include:   Orders Placed This Encounter  Procedures  . EKG 12-Lead    Disposition:   FU 6 months at [facility] in [facility], request Dr Radford Pax  Signed, [name], MD  03/17/2018 9:35 PM    [address], [place], [place]  [address] Phone: [phone]; Fax: [phone]

## 2018-03-17 ENCOUNTER — Encounter: Payer: Self-pay | Admitting: Cardiovascular Disease

## 2018-06-03 ENCOUNTER — Other Ambulatory Visit: Payer: Self-pay | Admitting: Cardiovascular Disease

## 2018-06-03 DIAGNOSIS — I10 Essential (primary) hypertension: Secondary | ICD-10-CM

## 2018-06-26 ENCOUNTER — Other Ambulatory Visit: Payer: Self-pay | Admitting: Physician Assistant

## 2018-06-26 DIAGNOSIS — I10 Essential (primary) hypertension: Secondary | ICD-10-CM

## 2020-09-21 ENCOUNTER — Telehealth: Payer: Self-pay | Admitting: Cardiovascular Disease

## 2020-09-21 NOTE — Telephone Encounter (Signed)
The patient reports she checked her BP at the same time she took her morning medications this morning and it was 169/111. She took an extra metoprolol 25 mg because her BP was elevated. On recheck this afternoon, her BP was 150/65, HR 65. Confirmed cardiac meds and instructed her to only check her BP 2 hours after taking her morning medications.  Reiterated to her not to take extra medications unless she speaks with a medical provider first. She will bring BP list to appointment already scheduled 2/17 and call prior to then if her BP remains elevated when treated.  She states she does not wish to see Dr. Radford Pax anymore and would only like to follow with [facility].   She was grateful for call and agrees with plan.

## 2020-09-21 NOTE — Telephone Encounter (Signed)
Pt c/o BP issue: STAT if pt c/o blurred vision, one-sided weakness or slurred speech  1. What are your last 5 BP readings? 169/111 yesterday at the doctor office- her monitor not working  2. Are you having any other symptoms (ex. Dizziness, headache, blurred vision, passed out)? Headache every day  3. What is your BP issue? high blood pressure

## 2020-09-21 NOTE — Telephone Encounter (Signed)
Follow Up:   Pt wanted to be seen for her blood pressure. I made an appt on 10-12-20 with Vin.

## 2020-10-12 ENCOUNTER — Other Ambulatory Visit: Payer: Self-pay

## 2020-10-12 ENCOUNTER — Ambulatory Visit: Payer: Medicare Other | Admitting: Physician Assistant

## 2020-10-12 ENCOUNTER — Encounter: Payer: Self-pay | Admitting: Physician Assistant

## 2020-10-12 VITALS — BP 130/60 | HR 69 | Ht 62.0 in | Wt 137.0 lb

## 2020-10-12 DIAGNOSIS — E782 Mixed hyperlipidemia: Secondary | ICD-10-CM

## 2020-10-12 DIAGNOSIS — I739 Peripheral vascular disease, unspecified: Secondary | ICD-10-CM | POA: Diagnosis not present

## 2020-10-12 DIAGNOSIS — I6523 Occlusion and stenosis of bilateral carotid arteries: Secondary | ICD-10-CM

## 2020-10-12 DIAGNOSIS — I10 Essential (primary) hypertension: Secondary | ICD-10-CM

## 2020-10-12 DIAGNOSIS — I2510 Atherosclerotic heart disease of native coronary artery without angina pectoris: Secondary | ICD-10-CM | POA: Diagnosis not present

## 2020-10-12 NOTE — Patient Instructions (Signed)
Medication Instructions:  Your physician recommends that you continue on your current medications as directed. Please refer to the Current Medication list given to you today.   *If you need a refill on your cardiac medications before your next appointment, please call your pharmacy*   Lab Work: None ordered  If you have labs (blood work) drawn today and your tests are completely normal, you will receive your results only by: . MyChart Message (if you have MyChart) OR . A paper copy in the mail If you have any lab test that is abnormal or we need to change your treatment, we will call you to review the results.   Testing/Procedures: None ordered   Follow-Up: At [facility], you and your health needs are our priority.  As part of our continuing mission to provide you with exceptional heart care, we have created designated Provider Care Teams.  These Care Teams include your primary Cardiologist (physician) and Advanced Practice Providers (APPs -  Physician Assistants and Nurse Practitioners) who all work together to provide you with the care you need, when you need it.  We recommend signing up for the patient portal called "MyChart".  Sign up information is provided on this After Visit Summary.  MyChart is used to connect with patients for Virtual Visits (Telemedicine).  Patients are able to view lab/test results, encounter notes, upcoming appointments, etc.  Non-urgent messages can be sent to your provider as well.   To learn more about what you can do with MyChart, go to [email or web address].    Your next appointment:   6 month(s)  The format for your next appointment:   In Person  Provider:   You may see Michael Cooper, MD or one of the following Advanced Practice Providers on your designated Care Team:    Scott Weaver, PA-C  Vin Bhagat, PA-C    Other Instructions  

## 2020-10-12 NOTE — Progress Notes (Signed)
Cardiology Office Note:    Date:  10/12/2020   ID:  Darlene Berry, DOB 22-May-1951, MRN [id number]  PCP:  Candi Leash, PA-C  [facility] Cardiologist:  [name], MD  [facility] Electrophysiologist:  None   Chief Complaint: high blood pressure   History of Present Illness:    Darlene Berry is a 70 y.o. female with a hx of CAD s/p CABG in 2014, HTN, HLD, carotid artery stenosis status post left carotid endarterectomy and PAD and OSA seen for blood pressure.   She presented with unstable angina in 2015 and underwent stenting of a nongrafted native left circumflex with a drug-eluting stent.  Repeat cardiac catheterization in 2017 for evaluation of chest pain demonstrated continued patency of the LIMA to LAD, saphenous vein graft RCA, and native left circumflex stent.  Ongoing medical therapy was recommended.  Last seen by Dr. Burt Knack 02/2018. She lives in [place], [place] and does not like to drove her. Referred to Delfin Gant, MD in [facility].   Echocardiogram March 2020 with LV function of 40%, grade 1 diastolic dysfunction and mild aortic stenosis.  Last seen by Dr. Radford Pax November 12, 2019.  She complained of chest pain, shortness of breath and palpitation.  Follow-up nuclear stress test 12/06/2019 was abnormal with mild reversible defect of the apex.  Patient underwent cardiac catheterization showing obstructive three-vessel disease with patent 2 out of 2 grafts.  No intervention done.  30-day monitor showed normal sinus rhythm with occasional PAC.  Last seen by vascular surgery at [facility] September 27, 2019.  Blood pressure difference in upper extremity.  Recommended lower extremity arterial study and wrist brachial indices and follow up.   Patient is here for follow-up.  She likes to follow-up with Dr. Burt Knack.  Home blood pressure reading 140-160/60-70s.  Blood pressure normal here at 130/60.  Patient is under a lot of stress taking care of disabled brother.  Denies chest  pain, shortness of breath, orthopnea, PND, syncope, lower extremity edema or melena.   Past Medical History:  Diagnosis Date  . Acute cystitis without hematuria 10/29/2016  . Anemia    after tubal preg. - rec'd bld. transfusion    . Anxiety   . Arthritis   . CAD (coronary artery disease)    a. s/p CABG in 2014 // b. LHC 2/15: grafts patent; OM1 80% >> PCI: Xience DES to OM1 // c. Myoview 9/16: no ischemia or scar, EF 67; Low Risk // d. LHC 10/17: pLAD 60, D1 50, Lat D1 70, oLCx 40, mLCx stent patent, pRCA 90, mRCA 99, EF 65 >> Med Rx.  . Carotid artery disease ([place])    Carotid US 1/16: bilateral ICA 1-39 >> FU 08/2016 // Carotid US 1/18: 0-39% bilat ICA; Elevated blood flow velocities in the subclavian arteries, bilaterally; FU 1 year.  . Diabetes mellitus ([place])    diagnosed - 1999  . Dysuria 10/29/2016  . Endometriosis   . 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 GERD (gastroesophageal reflux disease)    "I have lots of acid"   . History of echocardiogram    a. Echo 6/14 (done at [facility]):  Mild LVH, EF 65-60%, Gr 2 DD.  Marland Kitchen Hx of transfusion   . Hyperlipemia   . Hypertension   . Insulin dependent diabetes mellitus 05/28/2016  . Neuropathy, diabetic ([place]) 04/06/2012  . Nocturia 10/29/2016  . Personal history of other malignant neoplasm of skin 12/29/2012  Overview:  basal cell carcinoma 1998  . Recurrent UTI 11/29/2015  . Right flank pain 11/29/2015  . 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   LIMA to LAD, SVG to RCA, EVH via right thigh  . Sleep apnea    uses CPAP q night - assessment, 2011- in [facility]   . Type II or unspecified type diabetes mellitus without mention of complication, not stated as uncontrolled 02/17/2009   Overview:  Type 2 Diabetes Mellitus - Uncomplicated, Controlled  . 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 ABDOMINAL HYSTERECTOMY    . APPENDECTOMY    . BREAST SURGERY     R breast- biopsy- benign    . CARDIAC CATHETERIZATION     x 3  . CARDIAC CATHETERIZATION N/A 06/05/2016   Procedure: Left Heart Cath and Cors/Grafts Angiography;  Surgeon: [name], MD;  Location: [facility];  Service: Cardiovascular;  Laterality: N/A;  . CAROTID ENDARTERECTOMY Left   . CORONARY ARTERY BYPASS GRAFT N/A 03/10/2013   Procedure: CORONARY ARTERY BYPASS GRAFTING (CABG);  Surgeon: Rexene Alberts, MD;  Location: [facility];  Service: Open Heart Surgery;  Laterality: N/A;  x2 using right greater saphenous vein and left internal mammary.   . CORONARY STENT PLACEMENT  09/30/2013   DES  LEFT CIRCUMFLEX    DR COOPER  . ENDOVEIN HARVEST OF GREATER SAPHENOUS VEIN Right 03/10/2013   Procedure: ENDOVEIN HARVEST OF GREATER SAPHENOUS VEIN;  Surgeon: Rexene Alberts, MD;  Location: [facility];  Service: Open Heart Surgery;  Laterality: Right;  . FRACTURE SURGERY     L foot- /w screw   . INTRAOPERATIVE TRANSESOPHAGEAL ECHOCARDIOGRAM N/A 03/10/2013   Procedure: INTRAOPERATIVE TRANSESOPHAGEAL ECHOCARDIOGRAM;  Surgeon: Rexene Alberts, MD;  Location: [facility];  Service: Open Heart Surgery;  Laterality: N/A;  . LEFT HEART CATHETERIZATION WITH CORONARY/GRAFT ANGIOGRAM N/A 09/30/2013   Procedure: LEFT HEART CATHETERIZATION WITH Beatrix Fetters;  Surgeon: Blane Ohara, MD;  Location: [facility] CATH [facility];  Service: Cardiovascular;  Laterality: N/A;  . SHOULDER SURGERY Left   . TUBAL LIGATION      Current Medications: Current Meds  Medication Sig  . ALPRAZolam (XANAX) 0.5 MG tablet Take 0.5 mg by mouth daily.  Marland Kitchen amLODipine (NORVASC) 10 MG tablet Take 1 tablet by mouth  daily  . Ascorbic Acid (VITAMIN C) 1000 MG tablet Take 1,000 mg by mouth daily.  . clopidogrel (PLAVIX) 75 MG tablet Take 1 tablet by mouth  nightly  . COENZYME Q10 PO Take 100 mg by mouth daily.  . DULoxetine (CYMBALTA) 30 MG capsule Take 30 mg by mouth daily.   . fenofibrate 160 MG tablet Take 160 mg by mouth at bedtime.   . insulin glargine (LANTUS) 100  UNIT/ML injection Inject 50 Units into the skin as needed.  . IRON PO Take 1 tablet by mouth daily.  . isosorbide mononitrate (IMDUR) 30 MG 24 hr tablet TAKE 1 TABLET BY MOUTH  DAILY  . lisinopril (PRINIVIL,ZESTRIL) 20 MG tablet TAKE 1 TABLET BY MOUTH TWO  TIMES DAILY  . Magnesium 400 MG CAPS Take by mouth daily in the afternoon.  . metformin (FORTAMET) 1000 MG (OSM) 24 hr tablet Take 1,000 mg by mouth 2 (two) times daily.  . methenamine (HIPREX) 1 g tablet Take 2 g by mouth 2 (two) times daily.  . metoprolol tartrate (LOPRESSOR) 50 MG tablet TAKE 1 AND 1/2 TABLETS BY  MOUTH TWO TIMES DAILY  .  nitroGLYCERIN (NITROSTAT) 0.4 MG SL tablet Place 1 tablet (0.4 mg total) under the tongue every 5 (five) minutes as needed for chest pain.  . ONE TOUCH ULTRA TEST test strip 1 each by Other route daily.   Marland Kitchen OZEMPIC, 0.25 OR 0.5 MG/DOSE, 2 MG/1.5ML SOPN Inject into the skin once a week.  . pantoprazole (PROTONIX) 40 MG tablet Take 40 mg by mouth daily as needed (INDIGESTION).  Marland Kitchen potassium gluconate 595 (99 K) MG TABS tablet Take 595 mg by mouth daily.  . rosuvastatin (CRESTOR) 20 MG tablet TAKE 1 TABLET BY MOUTH  DAILY     Allergies:   Patient has no known allergies.   Social History   Socioeconomic History  . Marital status: Divorced    Spouse name: Not on file  . Number of children: Not on file  . Years of education: Not on file  . Highest education level: Not on file  Occupational History  . Not on file  Tobacco Use  . Smoking status: Current Some Day Smoker    Packs/day: 0.50    Types: Cigarettes    Last attempt to quit: 03/10/2013    Years since quitting: 7.5  . Smokeless tobacco: Never Used  Substance and Sexual Activity  . Alcohol use: No    Comment: beer- rare occasion   . Drug use: No  . Sexual activity: Not on file  Other Topics Concern  . Not on file  Social History Narrative  . Not on file   Social Determinants of Health   Financial Resource Strain: Not on file  Food  Insecurity: Not on file  Transportation Needs: Not on file  Physical Activity: Not on file  Stress: Not on file  Social Connections: Not on file     Family History: The patient's family history includes Cancer in her mother; Diabetes in her brother and father; Heart disease in her brother and father; Hyperlipidemia in her brother and father; Hypertension in her brother and father; Leukemia in her sister.    ROS:   Please see the history of present illness.    All other systems reviewed and are negative.  EKGs/Labs/Other Studies Reviewed:    The following studies were reviewed today:  ABI's & Arterial doppler 09/20/20  Conclusion:   Right: ABI 1.02 with great toe pressure 112 mmHg.   Left: ABI 0.83 with great toe pressure 100 mmHg.   Narrative Performed by CPACS This result has an attachment that is not available.  .  Right ABI  Right:  Brachial pressure 135 mmHg.  PTA pressure 158 mmHg with ABI 0.93. Biphasic Doppler, normal PVR.  DPA pressure 172 mmHg with ABI 1.02. Triphasic Doppler, normal PVR.  1st toe pressure 112 mmHg with TBI 0.66. Normal PPG.   Left ABI  Left:  Brachial pressure 169 mmHg.  PTA pressure 140 mmHg with ABI 0.83. Biphasic Doppler, normal PVR.  DPA pressure 137 mmHg with ABI 0.81. Biphasic Doppler, normal PVR.  1st toe pressure 100 mmHg with TBI 0.59. Mildly reduced PPG.  Conclusion:   Right:  Patent common femoral, superficial femoral, profunda femoral, popliteal,  posterior tibial, and peroneal arteries with no evidence for stenosis.  Narrative Performed by CPACS This result has an attachment that is not available.  .  Left Lower Arterial  CFA, PFA, SFA, popliteal artery, PTA, and peroneal artery are patent with biphasic spectral tracings, and with no elevated velocities.   Cath 11/2019 Coronary Angiography  1. Left Main -normal  2. Left  anterior descending artery -25% proximal, 100% mid  3. Diagonals -normal  4. Left Circumflex  -small but normal widely patent stent  5. Obtuse Marginals -normal  6. Right Coronary Artery -100% mid  7. LIMA to the LAD is widely patent  8. Saphenous vein graft to the PDA is widely patent   Hemodynamics  1. Left Ventricular end-diastolic [id number] mmHg   CONCLUSIONS:  1. Successful transfemoral cardiac catheterization  2. Obstructive two-vessel coronary artery disease with 2 out of 2 bypass  grafts patent  3. Left ventricular diastolic pressure 11   RECOMMENDATIONS: The patient has no indication for PCI. She will follow  up as scheduled.   EKG:  EKG is  ordered today.  The ekg ordered today demonstrates SR at reate of 69 bpm  Recent Labs: No results found for requested labs within last 8760 hours.  Recent Lipid Panel    Component Value Date/Time   CHOL 193 09/24/2013 1616   TRIG 229 (H) 09/24/2013 1616   HDL 37 (L) 09/24/2013 1616   CHOLHDL 5.2 09/24/2013 1616   VLDL 46 (H) 09/24/2013 1616   LDLCALC 110 (H) 09/24/2013 1616   Physical Exam:    VS:  BP 130/60   Pulse 69   Ht 5\' 2"  (1.575 m)   Wt 137 lb (62.1 kg)   SpO2 98%   BMI 25.06 kg/m     Wt Readings from Last 3 Encounters:  10/12/20 137 lb (62.1 kg)  03/16/18 163 lb (73.9 kg)  09/02/17 175 lb 12.8 oz (79.7 kg)     GEN:  Well nourished, well developed in no acute distress HEENT: Normal NECK: No JVD; No carotid bruits LYMPHATICS: No lymphadenopathy CARDIAC: RRR, + murmurs, rubs, gallops RESPIRATORY:  Clear to auscultation without rales, wheezing or rhonchi  ABDOMEN: Soft, non-tender, non-distended MUSCULOSKELETAL:  No edema; No deformity  SKIN: Warm and dry NEUROLOGIC:  Alert and oriented x 3 PSYCHIATRIC:  Normal affect   ASSESSMENT AND PLAN:    1. CAD No angina.  Continue Plavix, Imdur, Lopressor and Crestor.  2.  Hypertension Reported elevated at home.  Normal here.  Per vascular surgery note 09/26/20  blood pressure difference in upper extremity.  Recommended lower extremity arterial  study and wrist brachial indices and follow up.  No change in therapy  3.  Hyperlipidemia -Continue statin   Medication Adjustments/Labs and Tests Ordered: Current medicines are reviewed at length with the patient today.  Concerns regarding medicines are outlined above.  No orders of the defined types were placed in this encounter.  No orders of the defined types were placed in this encounter.   There are no Patient Instructions on file for this visit.   Jarrett Soho, [place]  10/12/2020 11:06 AM    [place]

## 2021-05-09 ENCOUNTER — Ambulatory Visit: Payer: Medicare Other | Admitting: Physician Assistant

## 2021-05-09 ENCOUNTER — Other Ambulatory Visit: Payer: Self-pay

## 2021-05-09 ENCOUNTER — Encounter: Payer: Self-pay | Admitting: Physician Assistant

## 2021-05-09 VITALS — BP 130/40 | HR 67 | Ht 62.0 in | Wt 138.8 lb

## 2021-05-09 DIAGNOSIS — I2510 Atherosclerotic heart disease of native coronary artery without angina pectoris: Secondary | ICD-10-CM | POA: Diagnosis not present

## 2021-05-09 DIAGNOSIS — I6523 Occlusion and stenosis of bilateral carotid arteries: Secondary | ICD-10-CM | POA: Diagnosis not present

## 2021-05-09 DIAGNOSIS — E782 Mixed hyperlipidemia: Secondary | ICD-10-CM

## 2021-05-09 DIAGNOSIS — I10 Essential (primary) hypertension: Secondary | ICD-10-CM | POA: Diagnosis not present

## 2021-05-09 DIAGNOSIS — I739 Peripheral vascular disease, unspecified: Secondary | ICD-10-CM

## 2021-05-09 MED ORDER — NITROGLYCERIN 0.4 MG SL SUBL: 0.4000 mg | SUBLINGUAL_TABLET | SUBLINGUAL | 3 refills | Status: DC | PRN

## 2021-05-09 NOTE — Patient Instructions (Addendum)
Medication Instructions:  Your physician recommends that you continue on your current medications as directed. Please refer to the Current Medication list given to you today.  *If you need a refill on your cardiac medications before your next appointment, please call your pharmacy*   Lab Work: NONE If you have labs (blood work) drawn today and your tests are completely normal, you will receive your results only by: [facility] (if you have MyChart) OR A paper copy in the mail If you have any lab test that is abnormal or we need to change your treatment, we will call you to review the results.   Testing/Procedures: NONE   Follow-Up: At [facility], you and your health needs are our priority.  As part of our continuing mission to provide you with exceptional heart care, we have created designated Provider Care Teams.  These Care Teams include your primary Cardiologist (physician) and Advanced Practice Providers (APPs -  Physician Assistants and Nurse Practitioners) who all work together to provide you with the care you need, when you need it.  We recommend signing up for the patient portal called "MyChart".  Sign up information is provided on this After Visit Summary.  MyChart is used to connect with patients for Virtual Visits (Telemedicine).  Patients are able to view lab/test results, encounter notes, upcoming appointments, etc.  Non-urgent messages can be sent to your provider as well.   To learn more about what you can do with MyChart, go to [email or web address].    Your next appointment:   1 year(s)  The format for your next appointment:   In Person  Provider:   You may see [name], MD or Richardson Dopp, PA-C

## 2021-05-09 NOTE — Progress Notes (Addendum)
Cardiology Office Note:    Date:  05/09/2021   ID:  Darlene Berry, DOB [birth year], MRN [id number]  PCP:  Candi Leash, PA-C   [facility] Providers Cardiologist:  [name], MD Cardiology APP:  Liliane Shi, [place]     Vascular Surgeon: Dr. Carmine Savoy [place])  Referring MD: Candi Leash, PA-C   Chief Complaint:  F/u for CAD    Patient Profile:    Darlene Berry is a 70 y.o. female with:  Coronary artery disease  S/p CABG in 2014 S/p DES to LCx (non-grafted) Myoview 9/16: low risk  Cath 2017: patent grafts, patent LCx stent >> Med Rx Diabetes mellitus  Hypertension  Hyperlipidemia  Carotid artery disease S/p L CEA  Peripheral arterial disease  S/p L CFA endarterectomy   Prior CV studies:  ABIs 09/20/20 ([facility]) Right: ABI 1.02 with great toe pressure 112 mmHg.  Left: ABI 0.83 with great toe pressure 100 mmHg.   Carotid US 05/04/2020 ([facility]) Right: No hemodynamically significant ICA stenosis, consistent with <60%.  Left: No hemodynamically significant ICA stenosis, consistent with <60%.  Cardiac catheterization 12/13/2019 ([facility]) Coronary Angiography  1. Left Main -normal  2. Left anterior descending artery -25% proximal, 100% mid  3. Diagonals -normal  4. Left Circumflex -small but normal widely patent stent  5. Obtuse Marginals -normal  6. Right Coronary Artery -100% mid  7.  LIMA to the LAD is widely patent  8.  Saphenous vein graft to the PDA is widely patent   Myoview 9/16 EF 67%, breast attenuation, no ischemia or scar; Low Risk   Echo (01/2013 at [facility]):   Mild LVH, EF 65-60%, Gr 2 DD.   [place] 2/15 PCI: 2.25 x 15 mm Xience DES to the OM1    History of Present Illness: Darlene Berry was last seen by Dr. Burt Knack in 7/19.  She is est with Dr. Radford Pax with cardiology at [facility] in 10/2018 as this was closer to her home.  She had an abnormal Myoview in 4/21 with small mild reversible defect of the apex.  Cardiac catheterization in 4/21  demonstrated patent bypass grafts and patent LCx stent.  Medical therapy was continued.  She returned to our practice in 09/2020 and was seen by Leanor Kail, PA-C.  She returns for follow-up.  She is here alone.  She is doing well.  She has not had chest pain, shortness of breath, syncope, orthopnea, leg edema.  She notes fatigue after extreme exertion but this is chronic without significant change.     Past Medical History:  Diagnosis Date   Acute cystitis without hematuria 10/29/2016   Anemia    after tubal preg. - rec'd bld. transfusion     Anxiety    Arthritis    CAD (coronary artery disease)    a. s/p CABG in 2014 // b. LHC 2/15: grafts patent; OM1 80% >> PCI: Xience DES to OM1 // c. Myoview 9/16: no ischemia or scar, EF 67; Low Risk // d. LHC 10/17: pLAD 60, D1 50, Lat D1 70, oLCx 40, mLCx stent patent, pRCA 90, mRCA 99, EF 65 >> Med Rx.   Carotid artery disease ([place])    Carotid US 1/16: bilateral ICA 1-39 >> FU 08/2016 // Carotid US 1/18: 0-39% bilat ICA; Elevated blood flow velocities in the subclavian arteries, bilaterally; FU 1 year.   Diabetes mellitus ([place])    diagnosed - 1999   Dysuria 10/29/2016   Endometriosis    Facial basal cell cancer  basal cell - nose    GERD (gastroesophageal reflux disease)    "I have lots of acid"    History of echocardiogram    a. Echo 6/14 (done at [facility]):  Mild LVH, EF 65-60%, Gr 2 DD.   Hx of transfusion    Hyperlipemia    Hypertension    Insulin dependent diabetes mellitus 05/28/2016   Neuropathy, diabetic ([place]) 04/06/2012   Nocturia 10/29/2016   Personal history of other malignant neoplasm of skin 12/29/2012   Overview:  basal cell carcinoma 1998   Recurrent UTI 11/29/2015   Right flank pain 11/29/2015   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   LIMA to LAD, SVG to RCA, EVH via right thigh   Sleep apnea    uses CPAP q night - assessment, 2011- in [facility]    Type II or unspecified type diabetes mellitus without mention of complication, not stated as  uncontrolled 02/17/2009   Overview:  Type 2 Diabetes Mellitus - Uncomplicated, Controlled   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    Current Medications: Current Meds  Medication Sig   ALPRAZolam (XANAX) 0.5 MG tablet Take 0.5 mg by mouth daily.   amLODipine (NORVASC) 10 MG tablet Take 1 tablet by mouth  daily   Ascorbic Acid (VITAMIN C) 1000 MG tablet Take 1,000 mg by mouth daily.   clopidogrel (PLAVIX) 75 MG tablet Take 1 tablet by mouth  nightly   COENZYME Q10 PO Take 100 mg by mouth daily.   DULoxetine (CYMBALTA) 30 MG capsule Take 30 mg by mouth daily.    fenofibrate 160 MG tablet Take 160 mg by mouth at bedtime.    insulin glargine (LANTUS) 100 UNIT/ML injection Inject 50 Units into the skin as needed.   IRON PO Take 1 tablet by mouth daily.   isosorbide mononitrate (IMDUR) 30 MG 24 hr tablet TAKE 1 TABLET BY MOUTH  DAILY   lisinopril (PRINIVIL,ZESTRIL) 20 MG tablet TAKE 1 TABLET BY MOUTH TWO  TIMES DAILY   Magnesium 400 MG CAPS Take by mouth daily in the afternoon.   metformin (FORTAMET) 1000 MG (OSM) 24 hr tablet Take 1,000 mg by mouth 2 (two) times daily.   methenamine (HIPREX) 1 g tablet Take 2 g by mouth 2 (two) times daily.   metoprolol tartrate (LOPRESSOR) 50 MG tablet TAKE 1 AND 1/2 TABLETS BY  MOUTH TWO TIMES DAILY   ONE TOUCH ULTRA TEST test strip 1 each by Other route daily.    OZEMPIC, 0.25 OR 0.5 MG/DOSE, 2 MG/1.5ML SOPN Inject into the skin once a week.   pantoprazole (PROTONIX) 40 MG tablet Take 40 mg by mouth daily as needed (INDIGESTION).   potassium gluconate 595 (99 K) MG TABS tablet Take 595 mg by mouth daily.   rosuvastatin (CRESTOR) 20 MG tablet TAKE 1 TABLET BY MOUTH  DAILY   [DISCONTINUED] nitroGLYCERIN (NITROSTAT) 0.4 MG SL tablet Place 1 tablet (0.4 mg total) under the tongue every 5 (five) minutes as needed for chest pain.     Allergies:   Patient has no known allergies.   Social History   Tobacco Use   Smoking status: Some Days     Packs/day: 0.50    Types: Cigarettes    Last attempt to quit: 03/10/2013    Years since quitting: 8.1   Smokeless tobacco: Never  Substance Use Topics   Alcohol use: No    Comment: beer- rare occasion    Drug use: No     Family Hx: The  patient's family history includes Cancer in her mother; Diabetes in her brother and father; Heart disease in her brother and father; Hyperlipidemia in her brother and father; Hypertension in her brother and father; Leukemia in her sister.  Review of Systems  Cardiovascular:  Positive for claudication (R leg).  Respiratory:  Negative for cough.   Gastrointestinal:  Negative for hematochezia and melena.  Genitourinary:  Negative for hematuria.    EKGs/Labs/Other Test Reviewed:    EKG:  EKG is not ordered today.  The ekg ordered today demonstrates n/a  Recent Labs: No results found for requested labs within last 8760 hours.   Recent Lipid Panel Lab Results  Component Value Date/Time   CHOL 193 09/24/2013 04:16 PM   TRIG 229 (H) 09/24/2013 04:16 PM   HDL 37 (L) 09/24/2013 04:16 PM   LDLCALC 110 (H) 09/24/2013 04:16 PM      Risk Assessment/Calculations:          Physical Exam:    VS:  BP (!) 130/40   Pulse 67   Ht '5\' 2"'$  (1.575 m)   Wt 138 lb 12.8 oz (63 kg)   SpO2 97%   BMI 25.39 kg/m     Wt Readings from Last 3 Encounters:  05/09/21 138 lb 12.8 oz (63 kg)  10/12/20 137 lb (62.1 kg)  03/16/18 163 lb (73.9 kg)     Constitutional:      Appearance: Healthy appearance. Not in distress.  Neck:     Vascular: No JVR. JVD normal.  Pulmonary:     Effort: Pulmonary effort is normal.     Breath sounds: No wheezing. No rales.  Cardiovascular:     Normal rate. Regular rhythm. Normal S1. Normal S2.      Murmurs: There is no murmur.  Edema:    Peripheral edema absent.  Abdominal:     Palpations: Abdomen is soft. There is no hepatomegaly.  Skin:    General: Skin is warm and dry.  Neurological:     Mental Status: Alert and oriented  to person, place and time.     Cranial Nerves: Cranial nerves are intact.        ASSESSMENT & PLAN:    1. Coronary artery disease involving native coronary artery of native heart without angina pectoris S/p CABG in 2014 and DES to un-grafted LCx in 2015.  Cardiac catheterization in 4/21 at [facility] demonstrated patent grafts (L-LAD, S-PDA) and patent LCx stent.  She is doing well without angina.  Continue clopidogrel 75 mg once daily, isosorbide mononitrate 30 mg once daily, metoprolol tartrate 75 mg twice daily, rosuvastatin 20 mg once daily.  F/u in 1 year.   2. Bilateral carotid artery stenosis S/p L CEA.  She is followed by vascular surgery at [facility] (Dr. Carmine Savoy).  3. PVD (peripheral vascular disease) with claudication (HCC) S/p L CVA endarterectomy.  She is followed by vascular surgery at [place] (Dr. Carmine Savoy).  She is having some R leg claudication vs radiculopathy.  She has an appt with vascular surgery tomorrow.   4. Essential hypertension BP is well controlled.  Continue amlodipine 10 mg once daily, isosorbide mono 30 mg once daily, lisinopril 20 twice daily, metoprolol tartrate 75 mg twice daily.    5. Mixed hyperlipidemia Continue rosuvastatin 20 mg once daily.  Request recent lipid panel, hepatic function panel from PCP.   Dispo:  Return in about 1 year (around 05/09/2022) for Routine Follow Up, w/ Dr. Burt Knack, or Richardson Dopp, PA-C.   Medication Adjustments/Labs and  Tests Ordered: Current medicines are reviewed at length with the patient today.  Concerns regarding medicines are outlined above.  Tests Ordered: No orders of the defined types were placed in this encounter.  Medication Changes: Meds ordered this encounter  Medications   nitroGLYCERIN (NITROSTAT) 0.4 MG SL tablet    Sig: Place 1 tablet (0.4 mg total) under the tongue every 5 (five) minutes as needed for chest pain.    Dispense:  25 tablet    Refill:  3     Signed, Richardson Dopp, PA-C  05/09/2021 4:10 PM     [facility] [place], [place],   [address] Phone: [phone]; Fax: [phone]

## 2021-05-15 ENCOUNTER — Telehealth: Payer: Self-pay | Admitting: Physician Assistant

## 2021-05-15 NOTE — Telephone Encounter (Signed)
Great.  Thanks! Richardson Dopp, PA-C    05/15/2021 9:22 PM

## 2021-05-15 NOTE — Telephone Encounter (Signed)
Patient reports that her PCP increased rosuvastatin to 40 mg daily and she has to return for blood work in the first week of October. She will ask them to forward copy of labs for review.

## 2021-05-15 NOTE — Telephone Encounter (Signed)
Received recent labs from PCP. 02/08/2021: Total cholesterol 197, triglycerides 267, HDL 36, LDL 114, ALT 26. Goal LDL is <70.  PLAN: Increase rosuvastatin to 40 mg daily LFTs in 4 weeks Fasting lipids, LFTs in 3 months Richardson Dopp, PA-C 05/15/2021 5:11 PM

## 2021-07-24 ENCOUNTER — Other Ambulatory Visit: Payer: Self-pay

## 2021-07-24 DIAGNOSIS — I10 Essential (primary) hypertension: Secondary | ICD-10-CM

## 2021-07-24 MED ORDER — LISINOPRIL 20 MG PO TABS: 20.0000 mg | ORAL_TABLET | Freq: Two times a day (BID) | ORAL | 2 refills | Status: DC

## 2021-07-24 MED ORDER — AMLODIPINE BESYLATE 10 MG PO TABS: 10.0000 mg | ORAL_TABLET | Freq: Every day | ORAL | 2 refills | Status: DC

## 2021-07-24 MED ORDER — CLOPIDOGREL BISULFATE 75 MG PO TABS: 75.0000 mg | ORAL_TABLET | Freq: Every day | ORAL | 2 refills | Status: DC

## 2021-07-24 MED ORDER — METOPROLOL TARTRATE 50 MG PO TABS: ORAL_TABLET | ORAL | 2 refills | Status: DC

## 2021-07-24 MED ORDER — ISOSORBIDE MONONITRATE ER 30 MG PO TB24: 30.0000 mg | ORAL_TABLET | Freq: Every day | ORAL | 2 refills | Status: DC

## 2021-07-24 NOTE — Telephone Encounter (Signed)
Pt's medication was sent to pt's pharmacy as requested. Confirmation received.  °

## 2022-02-12 ENCOUNTER — Other Ambulatory Visit: Payer: Self-pay | Admitting: Cardiovascular Disease

## 2022-02-12 DIAGNOSIS — I10 Essential (primary) hypertension: Secondary | ICD-10-CM

## 2022-03-13 ENCOUNTER — Other Ambulatory Visit: Payer: Self-pay | Admitting: Cardiovascular Disease

## 2022-03-14 ENCOUNTER — Other Ambulatory Visit: Payer: Self-pay | Admitting: Cardiovascular Disease

## 2022-03-14 DIAGNOSIS — I10 Essential (primary) hypertension: Secondary | ICD-10-CM

## 2022-05-08 ENCOUNTER — Other Ambulatory Visit: Payer: Self-pay | Admitting: Cardiovascular Disease

## 2022-05-08 DIAGNOSIS — I10 Essential (primary) hypertension: Secondary | ICD-10-CM

## 2022-05-28 ENCOUNTER — Encounter: Payer: Self-pay | Admitting: Cardiovascular Disease

## 2022-05-28 ENCOUNTER — Ambulatory Visit: Payer: Medicare Other | Attending: Cardiovascular Disease | Admitting: Cardiovascular Disease

## 2022-05-28 VITALS — BP 134/78 | HR 61 | Ht 62.0 in | Wt 125.0 lb

## 2022-05-28 DIAGNOSIS — I25119 Atherosclerotic heart disease of native coronary artery with unspecified angina pectoris: Secondary | ICD-10-CM

## 2022-05-28 DIAGNOSIS — E782 Mixed hyperlipidemia: Secondary | ICD-10-CM

## 2022-05-28 DIAGNOSIS — I6523 Occlusion and stenosis of bilateral carotid arteries: Secondary | ICD-10-CM | POA: Diagnosis not present

## 2022-05-28 DIAGNOSIS — I10 Essential (primary) hypertension: Secondary | ICD-10-CM | POA: Diagnosis not present

## 2022-05-28 DIAGNOSIS — I739 Peripheral vascular disease, unspecified: Secondary | ICD-10-CM

## 2022-05-28 MED ORDER — NITROGLYCERIN 0.4 MG SL SUBL: SUBLINGUAL_TABLET | SUBLINGUAL | 0 refills | Status: DC

## 2022-05-28 NOTE — Patient Instructions (Signed)
Medication Instructions:  Your physician recommends that you continue on your current medications as directed. Please refer to the Current Medication list given to you today.  *If you need a refill on your cardiac medications before your next appointment, please call your pharmacy*   Lab Work: NONE If you have labs (blood work) drawn today and your tests are completely normal, you will receive your results only by: MyChart Message (if you have MyChart) OR A paper copy in the mail If you have any lab test that is abnormal or we need to change your treatment, we will call you to review the results.   Testing/Procedures: NONE   Follow-Up: At [facility], you and your health needs are our priority.  As part of our continuing mission to provide you with exceptional heart care, we have created designated Provider Care Teams.  These Care Teams include your primary Cardiologist (physician) and Advanced Practice Providers (APPs -  Physician Assistants and Nurse Practitioners) who all work together to provide you with the care you need, when you need it.  We recommend signing up for the patient portal called "MyChart".  Sign up information is provided on this After Visit Summary.  MyChart is used to connect with patients for Virtual Visits (Telemedicine).  Patients are able to view lab/test results, encounter notes, upcoming appointments, etc.  Non-urgent messages can be sent to your provider as well.   To learn more about what you can do with MyChart, go to [email or web address].    Your next appointment:   1 year(s)  The format for your next appointment:   In Person  Provider:   Michael Cooper, MD  or APP     Important Information About Sugar       

## 2022-05-28 NOTE — Progress Notes (Signed)
Cardiology Office Note:    Date:  05/28/2022   ID:  Darlene Berry, DOB 1951/06/17, MRN [id number]  PCP:  Sherrilyn Rist   [facility] Providers Cardiologist:  [name], MD Cardiology APP:  Sharmon Revere     Referring MD: Candi Leash, PA-C   Chief Complaint  Patient presents with   Annual Exam   Coronary Artery Disease    History of Present Illness:    Darlene Berry is a 71 y.o. female with a hx of: Coronary artery disease  S/p CABG in 2014 S/p DES to LCx (non-grafted) Myoview 9/16: low risk  Cath 2017: patent grafts, patent LCx stent >> Med Rx Diabetes mellitus  Hypertension  Hyperlipidemia  Carotid artery disease S/p L CEA  Peripheral arterial disease  S/p L CFA endarterectomy   The patient is here alone today. She sees her PCP every 3 months and her labs are followed through that office.  She reports that she has been doing well and has not required any nitroglycerin over the last year.  She had a heart catheterization performed in 2021 at [facility] and this demonstrated patent bypass grafts and a patent circumflex stent.  Her medications are unchanged.  She recently had a CAT scan demonstrating a "narrowing in her aorta."  We do not have a copy of this but she is going to follow-up with her vascular specialist.  She had a carotid duplex scan last month and I was able to review the results of this, demonstrating less than 50% stenosis in the left and right internal carotid arteries.  ABIs were also reviewed and they were 0.83 on the left and 0.94 on the right, both stable from previous exams.  She is not currently having any claudication symptoms.  She is lost a lot of weight and is now on Ozempic.  She has no chest pain, chest pressure, or heart palpitations.  She denies leg edema.   Past Medical History:  Diagnosis Date   Acute cystitis without hematuria 10/29/2016   Anemia    after tubal preg. - rec'd bld. transfusion     Anxiety     Arthritis    CAD (coronary artery disease)    a. s/p CABG in 2014 // b. LHC 2/15: grafts patent; OM1 80% >> PCI: Xience DES to OM1 // c. Myoview 9/16: no ischemia or scar, EF 67; Low Risk // d. LHC 10/17: pLAD 60, D1 50, Lat D1 70, oLCx 40, mLCx stent patent, pRCA 90, mRCA 99, EF 65 >> Med Rx.   Carotid artery disease ([place])    Carotid US 1/16: bilateral ICA 1-39 >> FU 08/2016 // Carotid US 1/18: 0-39% bilat ICA; Elevated blood flow velocities in the subclavian arteries, bilaterally; FU 1 year.   Diabetes mellitus ([place])    diagnosed - 1999   Dysuria 10/29/2016   Endometriosis    Facial basal cell cancer    basal cell - nose    GERD (gastroesophageal reflux disease)    "I have lots of acid"    History of echocardiogram    a. Echo 6/14 (done at [facility]):  Mild LVH, EF 65-60%, Gr 2 DD.   Hx of transfusion    Hyperlipemia    Hypertension    Insulin dependent diabetes mellitus 05/28/2016   Neuropathy, diabetic ([place]) 04/06/2012   Nocturia 10/29/2016   Personal history of other malignant neoplasm of skin 12/29/2012   Overview:  basal cell carcinoma 1998   Recurrent UTI  11/29/2015   Right flank pain 11/29/2015   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   LIMA to LAD, SVG to RCA, EVH via right thigh   Sleep apnea    uses CPAP q night - assessment, 2011- in [facility]    Type II or unspecified type diabetes mellitus without mention of complication, not stated as uncontrolled 02/17/2009   Overview:  Type 2 Diabetes Mellitus - Uncomplicated, Controlled   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    Past Surgical History:  Procedure Laterality Date   ABDOMINAL HYSTERECTOMY     APPENDECTOMY     BREAST SURGERY     R breast- biopsy- benign    CARDIAC CATHETERIZATION     x 3   CARDIAC CATHETERIZATION N/A 06/05/2016   Procedure: Left Heart Cath and Cors/Grafts Angiography;  Surgeon: [name], MD;  Location: [place] [facility];  Service: Cardiovascular;  Laterality: N/A;   CAROTID ENDARTERECTOMY Left     CORONARY ARTERY BYPASS GRAFT N/A 03/10/2013   Procedure: CORONARY ARTERY BYPASS GRAFTING (CABG);  Surgeon: Rexene Alberts, MD;  Location: [place];  Service: Open Heart Surgery;  Laterality: N/A;  x2 using right greater saphenous vein and left internal mammary.    CORONARY STENT PLACEMENT  09/30/2013   DES  LEFT CIRCUMFLEX    DR Karrie Fluellen   ENDOVEIN HARVEST OF GREATER SAPHENOUS VEIN Right 03/10/2013   Procedure: ENDOVEIN HARVEST OF GREATER SAPHENOUS VEIN;  Surgeon: Rexene Alberts, MD;  Location: [place];  Service: Open Heart Surgery;  Laterality: Right;   FRACTURE SURGERY     L foot- /w screw    INTRAOPERATIVE TRANSESOPHAGEAL ECHOCARDIOGRAM N/A 03/10/2013   Procedure: INTRAOPERATIVE TRANSESOPHAGEAL ECHOCARDIOGRAM;  Surgeon: Rexene Alberts, MD;  Location: [place];  Service: Open Heart Surgery;  Laterality: N/A;   LEFT HEART CATHETERIZATION WITH CORONARY/GRAFT ANGIOGRAM N/A 09/30/2013   Procedure: LEFT HEART CATHETERIZATION WITH Beatrix Fetters;  Surgeon: Blane Ohara, MD;  Location: [facility];  Service: Cardiovascular;  Laterality: N/A;   SHOULDER SURGERY Left    TUBAL LIGATION      Current Medications: Current Meds  Medication Sig   ALPRAZolam (XANAX) 0.5 MG tablet Take 0.5 mg by mouth daily.   amLODipine (NORVASC) 10 MG tablet Take 1 tablet (10 mg total) by mouth daily. Please call [phone] to schedule an appointment for future refills. Thank you. 1st attempt.   Ascorbic Acid (VITAMIN C) 1000 MG tablet Take 2,000 mg by mouth daily.   clopidogrel (PLAVIX) 75 MG tablet TAKE 1 TABLET BY MOUTH AT  BEDTIME   COENZYME Q10 PO Take 100 mg by mouth daily.   DULoxetine (CYMBALTA) 30 MG capsule Take 30 mg by mouth daily.    fenofibrate 160 MG tablet Take 160 mg by mouth at bedtime.    insulin glargine (LANTUS) 100 UNIT/ML injection Inject 50 Units into the skin as needed.   IRON PO Take 1 tablet by mouth daily.   isosorbide mononitrate (IMDUR) 30 MG 24 hr tablet Take 1 tablet (30 mg total)  by mouth daily. Please call [phone] to schedule an appointment for future refills. Thank you. 1st attempt.   lisinopril (ZESTRIL) 20 MG tablet TAKE 1 TABLET BY MOUTH TWICE  DAILY   Magnesium 400 MG CAPS Take by mouth daily in the afternoon.   metformin (FORTAMET) 1000 MG (OSM) 24 hr tablet Take 1,000 mg by mouth 2 (two) times daily.   metoprolol tartrate (LOPRESSOR) 50 MG tablet TAKE 1 AND 1/2 TABLETS BY  MOUTH  TWICE DAILY   nitrofurantoin (MACRODANTIN) 100 MG capsule Take 100 mg by mouth at bedtime. With food or milk   nitroGLYCERIN (NITROSTAT) 0.4 MG SL tablet Place 1 tablet (0.4 mg total) under the tongue every 5 (five) minutes as needed for chest pain.   nitroGLYCERIN (NITROSTAT) 0.4 MG SL tablet Dissolve 1 tablet under the tongue every 5 minutes as needed for chest pain. Max of 3 doses, then 911.   ONE TOUCH ULTRA TEST test strip 1 each by Other route daily.    OZEMPIC, 0.25 OR 0.5 MG/DOSE, 2 MG/1.5ML SOPN Inject into the skin once a week.   pantoprazole (PROTONIX) 40 MG tablet Take 40 mg by mouth daily as needed (INDIGESTION).   potassium gluconate 595 (99 K) MG TABS tablet Take 595 mg by mouth daily.   rosuvastatin (CRESTOR) 40 MG tablet Take 40 mg by mouth daily.     Allergies:   Cefuroxime   Social History   Socioeconomic History   Marital status: Divorced    Spouse name: Not on file   Number of children: Not on file   Years of education: Not on file   Highest education level: Not on file  Occupational History   Not on file  Tobacco Use   Smoking status: Some Days    Packs/day: 0.50    Types: Cigarettes    Last attempt to quit: 03/10/2013    Years since quitting: 9.2   Smokeless tobacco: Never  Substance and Sexual Activity   Alcohol use: No    Comment: beer- rare occasion    Drug use: No   Sexual activity: Not on file  Other Topics Concern   Not on file  Social History Narrative   Not on file   Social Determinants of Health   Financial Resource Strain: Not  on file  Food Insecurity: Not on file  Transportation Needs: Not on file  Physical Activity: Not on file  Stress: Not on file  Social Connections: Not on file     Family History: The patient's family history includes Cancer in her mother; Diabetes in her brother and father; Heart disease in her brother and father; Hyperlipidemia in her brother and father; Hypertension in her brother and father; Leukemia in her sister.  ROS:   Please see the history of present illness.    All other systems reviewed and are negative.  EKGs/Labs/Other Studies Reviewed:    The following studies were reviewed today: Cardiac Cath 12/13/2019: CLINICAL INFORMATION: 71 year old female who presents for evaluation of an  abnormal stress test.   FINDINGS:   Coronary Angiography  1. Left Main -normal  2. Left anterior descending artery -25% proximal, 100% mid  3. Diagonals -normal  4. Left Circumflex -small but normal widely patent stent  5. Obtuse Marginals -normal  6. Right Coronary Artery -100% mid  7.  LIMA to the LAD is widely patent  8.  Saphenous vein graft to the PDA is widely patent   Hemodynamics  1. Left Ventricular end-diastolic [id number] mmHg   CONCLUSIONS:  1. Successful transfemoral cardiac catheterization  2. Obstructive two-vessel coronary artery disease with 2 out of 2 bypass  grafts patent  3. Left ventricular diastolic pressure 11   RECOMMENDATIONS: The patient has no indication for PCI.  She will follow  up as scheduled.  Carotid US 05/07/22: Conclusion:   Right ICA stenosis <50%. Antegrade vertebral artery flow.   Left ICA stenosis <50%. Antegrade vertebral artery flow. S/P carotid  endarterectomy.  ABI 05/07/22: Conclusion:   Right ABI 0.94 with 1st toe pressure of 119 mmHg.   Left ABI 0.83 with 1st toe pressure of 108 mmHg.  EKG:  EKG is ordered today.  The ekg ordered today demonstrates normal sinus rhythm 61 bpm, first-degree AV block, cannot rule out  anterior infarct age undetermined.  Recent Labs: No results found for requested labs within last 365 days.  Recent Lipid Panel    Component Value Date/Time   CHOL 193 09/24/2013 1616   TRIG 229 (H) 09/24/2013 1616   HDL 37 (L) 09/24/2013 1616   CHOLHDL 5.2 09/24/2013 1616   VLDL 46 (H) 09/24/2013 1616   LDLCALC 110 (H) 09/24/2013 1616     Risk Assessment/Calculations:                Physical Exam:    VS:  BP 134/78   Pulse 61   Ht '5\' 2"'$  (1.575 m)   Wt 125 lb (56.7 kg)   SpO2 97%   BMI 22.86 kg/m     Wt Readings from Last 3 Encounters:  05/28/22 125 lb (56.7 kg)  05/09/21 138 lb 12.8 oz (63 kg)  10/12/20 137 lb (62.1 kg)     GEN:  Well nourished, well developed in no acute distress HEENT: Normal NECK: No JVD; R>L carotid bruit LYMPHATICS: No lymphadenopathy CARDIAC: RRR, no murmurs, rubs, gallops RESPIRATORY:  Clear to auscultation without rales, wheezing or rhonchi  ABDOMEN: Soft, non-tender, non-distended MUSCULOSKELETAL:  No edema; No deformity  SKIN: Warm and dry NEUROLOGIC:  Alert and oriented x 3 PSYCHIATRIC:  Normal affect   ASSESSMENT:    1. Coronary artery disease involving native coronary artery of native heart with angina pectoris ([place])   2. Bilateral carotid artery stenosis   3. Mixed hyperlipidemia   4. Essential hypertension   5. PVD (peripheral vascular disease) with claudication (H[facility])    PLAN:    In order of problems listed above:  The patient is stable on a regimen of clopidogrel, antianginal therapy with amlodipine, metoprolol, and isosorbide, and lipid lowering with rosuvastatin 40 mg daily.  No changes are made today.  I reviewed her most recent cardiac catheterization results from 2021 which demonstrated bypass graft and coronary stent patency. Less than 50% stenosis bilaterally.  Recent carotid duplex reviewed.  Continue current medical therapy.  Patient with history of left carotid endarterectomy. Followed by PCP.  Treated with  fenofibrate and a high intensity statin drug.  States her labs are checked on a regular basis. Blood pressure improved on a second blood pressure check.  Continue current therapy. She will follow-up on her "aortic narrowing" with her vascular specialist.  It sounds like this might be in her abdominal aorta.  Her ABIs are stable and she does not have any concerning symptoms at this time.  As outlined above, she is on appropriate medical therapy with an antiplatelet agent, lisinopril, metoprolol, and high intensity statin drug.           Medication Adjustments/Labs and Tests Ordered: Current medicines are reviewed at length with the patient today.  Concerns regarding medicines are outlined above.  Orders Placed This Encounter  Procedures   EKG 12-Lead   Meds ordered this encounter  Medications   nitroGLYCERIN (NITROSTAT) 0.4 MG SL tablet    Sig: Dissolve 1 tablet under the tongue every 5 minutes as needed for chest pain. Max of 3 doses, then 911.    Dispense:  50 tablet    Refill:  0  Please fill 2 bottles for patient    Patient Instructions  Medication Instructions:  Your physician recommends that you continue on your current medications as directed. Please refer to the Current Medication list given to you today.   *If you need a refill on your cardiac medications before your next appointment, please call your pharmacy*   Lab Work: NONE If you have labs (blood work) drawn today and your tests are completely normal, you will receive your results only by: [place] (if you have MyChart) OR A paper copy in the mail If you have any lab test that is abnormal or we need to change your treatment, we will call you to review the results.   Testing/Procedures: NONE   Follow-Up: At [facility], you and your health needs are our priority.  As part of our continuing mission to provide you with exceptional heart care, we have created designated Provider Care Teams.   These Care Teams include your primary Cardiologist (physician) and Advanced Practice Providers (APPs -  Physician Assistants and Nurse Practitioners) who all work together to provide you with the care you need, when you need it.  We recommend signing up for the patient portal called "MyChart".  Sign up information is provided on this After Visit Summary.  MyChart is used to connect with patients for Virtual Visits (Telemedicine).  Patients are able to view lab/test results, encounter notes, upcoming appointments, etc.  Non-urgent messages can be sent to your provider as well.   To learn more about what you can do with MyChart, go to [email or web address].    Your next appointment:   1 year(s)  The format for your next appointment:   In Person  Provider:   [name], MD  or APP     Important Information About Sugar         Signed, [name], MD  05/28/2022 1:44 PM    [place]

## 2022-06-04 ENCOUNTER — Other Ambulatory Visit: Payer: Self-pay | Admitting: Cardiovascular Disease

## 2022-06-05 ENCOUNTER — Other Ambulatory Visit: Payer: Self-pay | Admitting: Cardiovascular Disease

## 2022-06-05 DIAGNOSIS — I10 Essential (primary) hypertension: Secondary | ICD-10-CM

## 2022-06-17 ENCOUNTER — Other Ambulatory Visit: Payer: Self-pay | Admitting: Cardiovascular Disease

## 2022-06-17 DIAGNOSIS — I10 Essential (primary) hypertension: Secondary | ICD-10-CM

## 2022-06-28 ENCOUNTER — Other Ambulatory Visit: Payer: Self-pay | Admitting: Cardiovascular Disease

## 2023-01-15 ENCOUNTER — Other Ambulatory Visit: Payer: Self-pay | Admitting: Cardiovascular Disease

## 2023-01-15 DIAGNOSIS — I10 Essential (primary) hypertension: Secondary | ICD-10-CM

## 2023-03-04 ENCOUNTER — Other Ambulatory Visit: Payer: Self-pay | Admitting: Cardiovascular Disease

## 2023-03-04 DIAGNOSIS — I10 Essential (primary) hypertension: Secondary | ICD-10-CM

## 2023-03-26 ENCOUNTER — Other Ambulatory Visit: Payer: Self-pay | Admitting: Cardiovascular Disease

## 2023-03-26 DIAGNOSIS — I10 Essential (primary) hypertension: Secondary | ICD-10-CM

## 2023-06-02 ENCOUNTER — Ambulatory Visit: Payer: Medicare Other | Attending: Cardiovascular Disease | Admitting: Cardiovascular Disease

## 2023-06-02 ENCOUNTER — Encounter: Payer: Self-pay | Admitting: Cardiovascular Disease

## 2023-06-02 VITALS — BP 130/64 | HR 61 | Ht 62.0 in | Wt 121.4 lb

## 2023-06-02 DIAGNOSIS — I25119 Atherosclerotic heart disease of native coronary artery with unspecified angina pectoris: Secondary | ICD-10-CM | POA: Diagnosis not present

## 2023-06-02 DIAGNOSIS — E782 Mixed hyperlipidemia: Secondary | ICD-10-CM

## 2023-06-02 DIAGNOSIS — I6523 Occlusion and stenosis of bilateral carotid arteries: Secondary | ICD-10-CM | POA: Diagnosis not present

## 2023-06-02 DIAGNOSIS — I739 Peripheral vascular disease, unspecified: Secondary | ICD-10-CM

## 2023-06-02 DIAGNOSIS — I10 Essential (primary) hypertension: Secondary | ICD-10-CM | POA: Diagnosis not present

## 2023-06-02 NOTE — Progress Notes (Signed)
Cardiology Office Note:    Date:  06/02/2023   ID:  Darlene Berry, DOB [birth year], MRN [id number]  PCP:  Toney Reil   [facility] Providers Cardiologist:  Tonny Bollman, MD Cardiology APP:  Kennon Rounds     Referring MD: Elder Negus, PA-C   Chief Complaint  Patient presents with   Coronary Artery Disease    History of Present Illness:    Darlene Berry is a 72 y.o. female with a hx of:  Coronary artery disease  S/p CABG in 2014 S/p DES to LCx (non-grafted) Myoview 9/16: low risk  Cath 2017: patent grafts, patent LCx stent >> Med Rx Diabetes mellitus  Hypertension  Hyperlipidemia  Carotid artery disease S/p L CEA  Peripheral arterial disease  S/p L CFA endarterectomy   The patient is here alone today.  She is doing well from a cardiac perspective.  She has continued to lose weight on Ozempic.  Reports that her insulin requirements are much less and that her blood pressure has been well-controlled.  Denies chest pain, chest pressure, heart palpitations, or shortness of breath.  She is trying to keep her weight where it is.  States that her weight got down to 117 pounds and she has intentionally gained a few pounds back.  She has regular care with her primary care physician and has labs drawn about every 3 months.  Lipids are followed by her PCP.  Past Medical History:  Diagnosis Date   Acute cystitis without hematuria 10/29/2016   Anemia    after tubal preg. - rec'd bld. transfusion     Anxiety    Arthritis    CAD (coronary artery disease)    a. s/p CABG in 2014 // b. LHC 2/15: grafts patent; OM1 80% >> PCI: Xience DES to OM1 // c. Myoview 9/16: no ischemia or scar, EF 67; Low Risk // d. LHC 10/17: pLAD 60, D1 50, Lat D1 70, oLCx 40, mLCx stent patent, pRCA 90, mRCA 99, EF 65 >> Med Rx.   Carotid artery disease (HCC)    Carotid US 1/16: bilateral ICA 1-39 >> FU 08/2016 // Carotid US 1/18: 0-39% bilat ICA; Elevated blood flow velocities in  the subclavian arteries, bilaterally; FU 1 year.   Diabetes mellitus (HCC)    diagnosed - 1999   Dysuria 10/29/2016   Endometriosis    Facial basal cell cancer    basal cell - nose    GERD (gastroesophageal reflux disease)    "I have lots of acid"    History of echocardiogram    a. Echo 6/14 (done at [facility]):  Mild LVH, EF 65-60%, Gr 2 DD.   Hx of transfusion    Hyperlipemia    Hypertension    Insulin dependent diabetes mellitus 05/28/2016   Neuropathy, diabetic (HCC) 04/06/2012   Nocturia 10/29/2016   Personal history of other malignant neoplasm of skin 12/29/2012   Overview:  basal cell carcinoma 1998   Recurrent UTI 11/29/2015   Right flank pain 11/29/2015   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   LIMA to LAD, SVG to RCA, EVH via right thigh   Sleep apnea    uses CPAP q night - assessment, 2011- in [facility]    Type II or unspecified type diabetes mellitus without mention of complication, not stated as uncontrolled 02/17/2009   Overview:  Type 2 Diabetes Mellitus - Uncomplicated, Controlled   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    Past Surgical History:  Procedure Laterality Date   ABDOMINAL HYSTERECTOMY     APPENDECTOMY     BREAST SURGERY     R breast- biopsy- benign    CARDIAC CATHETERIZATION     x 3   CARDIAC CATHETERIZATION N/A 06/05/2016   Procedure: Left Heart Cath and Cors/Grafts Angiography;  Surgeon: Tonny Bollman, MD;  Location: [facility] INVASIVE CV LAB;  Service: Cardiovascular;  Laterality: N/A;   CAROTID ENDARTERECTOMY Left    CORONARY ARTERY BYPASS GRAFT N/A 03/10/2013   Procedure: CORONARY ARTERY BYPASS GRAFTING (CABG);  Surgeon: Purcell Nails, MD;  Location: [facility] OR;  Service: Open Heart Surgery;  Laterality: N/A;  x2 using right greater saphenous vein and left internal mammary.    CORONARY STENT PLACEMENT  09/30/2013   DES  LEFT CIRCUMFLEX    DR Camelle Henkels   ENDOVEIN HARVEST OF GREATER SAPHENOUS VEIN Right 03/10/2013   Procedure: ENDOVEIN HARVEST OF GREATER SAPHENOUS  VEIN;  Surgeon: Purcell Nails, MD;  Location: [facility] OR;  Service: Open Heart Surgery;  Laterality: Right;   FRACTURE SURGERY     L foot- /w screw    INTRAOPERATIVE TRANSESOPHAGEAL ECHOCARDIOGRAM N/A 03/10/2013   Procedure: INTRAOPERATIVE TRANSESOPHAGEAL ECHOCARDIOGRAM;  Surgeon: Purcell Nails, MD;  Location: [facility] OR;  Service: Open Heart Surgery;  Laterality: N/A;   LEFT HEART CATHETERIZATION WITH CORONARY/GRAFT ANGIOGRAM N/A 09/30/2013   Procedure: LEFT HEART CATHETERIZATION WITH Isabel Caprice;  Surgeon: Micheline Chapman, MD;  Location: [facility] CATH LAB;  Service: Cardiovascular;  Laterality: N/A;   SHOULDER SURGERY Left    TUBAL LIGATION      Current Medications: Current Meds  Medication Sig   ALPRAZolam (XANAX) 0.5 MG tablet Take 0.5 mg by mouth daily.   amLODipine (NORVASC) 10 MG tablet TAKE 1 TABLET BY MOUTH DAILY   Ascorbic Acid (VITAMIN C) 1000 MG tablet Take 2,000 mg by mouth daily.   clopidogrel (PLAVIX) 75 MG tablet TAKE 1 TABLET BY MOUTH AT  BEDTIME   COENZYME Q10 PO Take 100 mg by mouth daily.   Continuous Glucose Receiver (FREESTYLE LIBRE 2 READER) DEVI as directed invitro checks blood sugars 4 times a day/Takes insulin 4 times a day   Continuous Glucose Sensor (FREESTYLE LIBRE 14 DAY SENSOR) MISC as directed apply once every 2 weeks for 30 days   Continuous Glucose Sensor (FREESTYLE LIBRE 2 SENSOR) MISC USE AS DIRECTED EVERY 2 WEEKS 28   DULoxetine (CYMBALTA) 30 MG capsule Take 30 mg by mouth daily.    fenofibrate 160 MG tablet Take 160 mg by mouth at bedtime.    ibuprofen (ADVIL) 800 MG tablet as needed.   insulin glargine (LANTUS) 100 UNIT/ML injection Inject 50 Units into the skin as needed.   IRON PO Take 1 tablet by mouth daily.   isosorbide mononitrate (IMDUR) 30 MG 24 hr tablet TAKE 1 TABLET BY MOUTH DAILY   lisinopril (ZESTRIL) 20 MG tablet TAKE 1 TABLET BY MOUTH TWICE  DAILY   Magnesium 400 MG CAPS Take by mouth daily in the afternoon.   metformin (FORTAMET)  1000 MG (OSM) 24 hr tablet Take 1,000 mg by mouth 2 (two) times daily.   metoprolol tartrate (LOPRESSOR) 50 MG tablet TAKE 1 AND 1/2 TABLETS BY MOUTH  TWICE DAILY   nitrofurantoin (MACRODANTIN) 100 MG capsule Take 100 mg by mouth at bedtime. With food or milk   nitroGLYCERIN (NITROSTAT) 0.4 MG SL tablet DISSOLVE 1 TABLET UNDER THE  TONGUE EVERY 5 MINUTES AS NEEDED FOR CHEST PAIN. MAX OF 3 TABLETS IN  15 MINUTES. CALL 911 IF PAIN  PERSISTS.   ONE TOUCH ULTRA TEST test strip 1 each by Other route daily.    OZEMPIC, 0.25 OR 0.5 MG/DOSE, 2 MG/1.5ML SOPN Inject into the skin once a week.   pantoprazole (PROTONIX) 40 MG tablet Take 40 mg by mouth daily as needed (INDIGESTION).   potassium gluconate 595 (99 K) MG TABS tablet Take 595 mg by mouth daily.   rosuvastatin (CRESTOR) 40 MG tablet Take 40 mg by mouth daily.     Allergies:   Cefuroxime   Social History   Socioeconomic History   Marital status: Divorced    Spouse name: Not on file   Number of children: Not on file   Years of education: Not on file   Highest education level: Not on file  Occupational History   Not on file  Tobacco Use   Smoking status: Some Days    Current packs/day: 0.00    Types: Cigarettes    Last attempt to quit: 03/10/2013    Years since quitting: 10.2   Smokeless tobacco: Never  Substance and Sexual Activity   Alcohol use: No    Comment: beer- rare occasion    Drug use: No   Sexual activity: Not on file  Other Topics Concern   Not on file  Social History Narrative   Not on file   Social Determinants of Health   Financial Resource Strain: Not on file  Food Insecurity: No Food Insecurity (09/26/2020)   Received from [facility]   Hunger Vital Sign    Worried About Running Out of Food in the Last Year: Never true    Ran Out of Food in the Last Year: Never true  Transportation Needs: Not on file  Physical Activity: Not on file  Stress: Not on file  Social Connections: Unknown  (01/04/2022)   Received from [facility]   Social Network    Social Network: Not on file     Family History: The patient's family history includes Cancer in her mother; Diabetes in her brother and father; Heart disease in her brother and father; Hyperlipidemia in her brother and father; Hypertension in her brother and father; Leukemia in her sister.  ROS:   Please see the history of present illness.    All other systems reviewed and are negative.  EKGs/Labs/Other Studies Reviewed:    The following studies were reviewed today: Carotid duplex performed at [facility] September 2023 showed less than 50% right ICA stenosis and less than 50% left ICA stenosis status post carotid endarterectomy EKG Interpretation Date/Time:  Monday June 02 2023 14:40:48 EDT Ventricular Rate:  61 PR Interval:  206 QRS Duration:  84 QT Interval:  410 QTC Calculation: 412 R Axis:   95  Text Interpretation: Normal sinus rhythm Rightward axis When compared with ECG of 01-Oct-2013 06:06, Borderline criteria for Anterior infarct are no longer Present and teh QRS axis has shifted rightward Confirmed by Tonny Bollman [id number]) on 06/02/2023 2:51:03 PM    Recent Labs: No results found for requested labs within last 365 days.  Recent Lipid Panel    Component Value Date/Time   CHOL 193 09/24/2013 1616   TRIG 229 (H) 09/24/2013 1616   HDL 37 (L) 09/24/2013 1616   CHOLHDL 5.2 09/24/2013 1616   VLDL 46 (H) 09/24/2013 1616   LDLCALC 110 (H) 09/24/2013 1616     Risk Assessment/Calculations:  Physical Exam:    VS:  BP 130/64   Pulse 61   Ht 5\' 2"  (1.575 m)   Wt 121 lb 6.4 oz (55.1 kg)   SpO2 98%   BMI 22.20 kg/m     Wt Readings from Last 3 Encounters:  06/02/23 121 lb 6.4 oz (55.1 kg)  05/28/22 125 lb (56.7 kg)  05/09/21 138 lb 12.8 oz (63 kg)     GEN:  Well nourished, well developed in no acute distress HEENT: Normal NECK: No JVD; No carotid bruits LYMPHATICS:  No lymphadenopathy CARDIAC: RRR, no murmurs, rubs, gallops RESPIRATORY:  Clear to auscultation without rales, wheezing or rhonchi  ABDOMEN: Soft, non-tender, non-distended MUSCULOSKELETAL:  No edema; No deformity  SKIN: Warm and dry NEUROLOGIC:  Alert and oriented x 3 PSYCHIATRIC:  Normal affect   ASSESSMENT:    1. Coronary artery disease involving native coronary artery of native heart with angina pectoris (HCC)   2. Bilateral carotid artery stenosis   3. Mixed hyperlipidemia   4. Essential hypertension   5. PVD (peripheral vascular disease) with claudication (HCC)    PLAN:    In order of problems listed above:  The patient is stable on clopidogrel for antiplatelet therapy, high intensity statin drug with rosuvastatin 40 mg daily, and amlodipine/isosorbide/metoprolol for antianginal therapy.  She is also on an ACE inhibitor in the setting of diabetes and CAD.  I will see her back in 1 year for follow-up evaluation. I reviewed her most recent carotid duplex which showed less than 50% bilateral ICA stenosis.  She will continue on medical therapy.  No stroke or TIA symptoms. LDL cholesterol goal less than 70 mg/dL.  Currently treated with rosuvastatin and fenofibrate.  She has lost a great deal of weight on Ozempic.  Continue current management.  Lipids followed by PCP. Blood pressure well-controlled on multidrug therapy as above. Stable with no claudication symptoms.  Continue medical therapy.           Medication Adjustments/Labs and Tests Ordered: Current medicines are reviewed at length with the patient today.  Concerns regarding medicines are outlined above.  Orders Placed This Encounter  Procedures   EKG 12-Lead   No orders of the defined types were placed in this encounter.   Patient Instructions  Medication Instructions:  Your physician recommends that you continue on your current medications as directed. Please refer to the Current Medication list given to you  today.  *If you need a refill on your cardiac medications before your next appointment, please call your pharmacy*   Lab Work: NONE If you have labs (blood work) drawn today and your tests are completely normal, you will receive your results only by: MyChart Message (if you have MyChart) OR A paper copy in the mail If you have any lab test that is abnormal or we need to change your treatment, we will call you to review the results.   Testing/Procedures: NONE   Follow-Up: At [facility], you and your health needs are our priority.  As part of our continuing mission to provide you with exceptional heart care, we have created designated Provider Care Teams.  These Care Teams include your primary Cardiologist (physician) and Advanced Practice Providers (APPs -  Physician Assistants and Nurse Practitioners) who all work together to provide you with the care you need, when you need it.  We recommend signing up for the patient portal called "MyChart".  Sign up information is provided on this After Visit Summary.  MyChart  is used to connect with patients for Virtual Visits (Telemedicine).  Patients are able to view lab/test results, encounter notes, upcoming appointments, etc.  Non-urgent messages can be sent to your provider as well.   To learn more about what you can do with MyChart, go to [email or web address].    Your next appointment:   1 year(s)  Provider:   Tonny Bollman, MD        Signed, Tonny Bollman, MD  06/02/2023 4:44 PM    [facility]

## 2023-06-02 NOTE — Patient Instructions (Signed)
Medication Instructions:  Your physician recommends that you continue on your current medications as directed. Please refer to the Current Medication list given to you today.  *If you need a refill on your cardiac medications before your next appointment, please call your pharmacy*   Lab Work: NONE If you have labs (blood work) drawn today and your tests are completely normal, you will receive your results only by: MyChart Message (if you have MyChart) OR A paper copy in the mail If you have any lab test that is abnormal or we need to change your treatment, we will call you to review the results.   Testing/Procedures: NONE   Follow-Up: At [facility], you and your health needs are our priority.  As part of our continuing mission to provide you with exceptional heart care, we have created designated Provider Care Teams.  These Care Teams include your primary Cardiologist (physician) and Advanced Practice Providers (APPs -  Physician Assistants and Nurse Practitioners) who all work together to provide you with the care you need, when you need it.  We recommend signing up for the patient portal called "MyChart".  Sign up information is provided on this After Visit Summary.  MyChart is used to connect with patients for Virtual Visits (Telemedicine).  Patients are able to view lab/test results, encounter notes, upcoming appointments, etc.  Non-urgent messages can be sent to your provider as well.   To learn more about what you can do with MyChart, go to [email or web address].    Your next appointment:   1 year(s)  Provider:   Michael Cooper, MD      

## 2023-06-11 ENCOUNTER — Other Ambulatory Visit: Payer: Self-pay | Admitting: Cardiovascular Disease

## 2023-06-11 DIAGNOSIS — I10 Essential (primary) hypertension: Secondary | ICD-10-CM

## 2023-08-13 ENCOUNTER — Other Ambulatory Visit: Payer: Self-pay | Admitting: Cardiovascular Disease

## 2023-08-13 DIAGNOSIS — I10 Essential (primary) hypertension: Secondary | ICD-10-CM

## 2024-01-24 ENCOUNTER — Other Ambulatory Visit: Payer: Self-pay | Admitting: Cardiovascular Disease

## 2024-01-24 DIAGNOSIS — I10 Essential (primary) hypertension: Secondary | ICD-10-CM

## 2024-01-28 ENCOUNTER — Telehealth: Payer: Self-pay | Admitting: Cardiovascular Disease

## 2024-01-28 NOTE — Telephone Encounter (Signed)
 Will forward this message to our HIM dept for further management of releasing requested records to [facility] in [place].

## 2024-01-28 NOTE — Telephone Encounter (Signed)
 Leanna with [facility] says the patient is in the office and their provider is requesting last office visit notes + an old EKG. Stat if possible.  fax#: [phone]  phone#: [phone]

## 2024-01-30 ENCOUNTER — Telehealth: Payer: Self-pay | Admitting: Cardiovascular Disease

## 2024-01-30 NOTE — Telephone Encounter (Signed)
 Pt called to report that she has been having exertional dyspnea.. she is okay at rest.. it has been worsening over the past 2 weeks... today she developed some chest pressure that was relieved after taking one nitroglycerin ... she declines going to the ED fro immediate assessment... I made her an appt for Monday 02/02/24 with an APP... she will have someone drive her... if anything worsens or changes.. she agrees to go to the ED over the weekend.

## 2024-01-30 NOTE — Telephone Encounter (Signed)
   Pt c/o of Chest Pain: STAT if active CP, including tightness, pressure, jaw pain, radiating pain to shoulder/upper arm/back, CP unrelieved by Nitro. Symptoms reported of SOB, nausea, vomiting, sweating.  1. Are you having CP right now?   Chest pains and chest discomfort- not at this time  2. Are you experiencing any other symptoms (ex. SOB, nausea, vomiting, sweating)?  Shortness of breath, not at this time   3. Is your CP continuous or coming and going? Comes and goes  most of  the time, but today its been constantly   4. Have you taken Nitroglycerin ? yes   5. How long have you been experiencing CP? Not quite a month- gradually getting worse- patient says she needs to be seen, but not today    6. If NO CP at time of call then end call with telling Pt to call back or call 911 if Chest pain returns prior to return call from triage team.

## 2024-02-01 NOTE — Progress Notes (Unsigned)
 Cardiology Clinic Note   Patient Name: Darlene Berry Date of Encounter: 02/02/2024  Primary Care Provider:  Herb Loges, PA-C Primary Cardiologist:  Arnoldo Lapping, MD  Patient Profile    Darlene Berry 73 year old female presents the clinic today for follow-up evaluation of her exertional dyspnea, coronary artery disease, and hyperlipidemia.  Past Medical History    Past Medical History:  Diagnosis Date   Acute cystitis without hematuria 10/29/2016   Anemia    after tubal preg. - rec'd bld. transfusion     Anxiety    Arthritis    CAD (coronary artery disease)    a. s/p CABG in 2014 // b. LHC 2/15: grafts patent; OM1 80% >> PCI: Xience DES to OM1 // c. Myoview 9/16: no ischemia or scar, EF 67; Low Risk // d. LHC 10/17: pLAD 60, D1 50, Lat D1 70, oLCx 40, mLCx stent patent, pRCA 90, mRCA 99, EF 65 >> Med Rx.   Carotid artery disease (HCC)    Carotid US  1/16: bilateral ICA 1-39 >> FU 08/2016 // Carotid US  1/18: 0-39% bilat ICA; Elevated blood flow velocities in the subclavian arteries, bilaterally; FU 1 year.   Diabetes mellitus (HCC)    diagnosed - 1999   Dysuria 10/29/2016   Endometriosis    Facial basal cell cancer    basal cell - nose    GERD (gastroesophageal reflux disease)    "I have lots of acid"    History of echocardiogram    a. Echo 6/14 (done at [facility]):  Mild LVH, EF 65-60%, Gr 2 DD.   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   LIMA to LAD, SVG to RCA, EVH via right thigh   Sleep apnea    uses CPAP q night - assessment, 2011- in [facility]    Type II or unspecified type diabetes mellitus without mention of complication, not stated as uncontrolled 02/17/2009    Overview:  Type 2 Diabetes Mellitus - Uncomplicated, Controlled   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   Past Surgical History:  Procedure Laterality Date   ABDOMINAL HYSTERECTOMY     APPENDECTOMY     BREAST SURGERY     R breast- biopsy- benign    CARDIAC CATHETERIZATION     x 3   CARDIAC CATHETERIZATION N/A 06/05/2016   Procedure: Left Heart Cath and Cors/Grafts Angiography;  Surgeon: Arnoldo Lapping, MD;  Location: [facility] INVASIVE CV LAB;  Service: Cardiovascular;  Laterality: N/A;   CAROTID ENDARTERECTOMY Left    CORONARY ARTERY BYPASS GRAFT N/A 03/10/2013   Procedure: CORONARY ARTERY BYPASS GRAFTING (CABG);  Surgeon: Gardenia Jump, MD;  Location: [facility] OR;  Service: Open Heart Surgery;  Laterality: N/A;  x2 using right greater saphenous vein and left internal mammary.    CORONARY STENT PLACEMENT  09/30/2013   DES  LEFT CIRCUMFLEX    DR COOPER   ENDOVEIN HARVEST OF GREATER SAPHENOUS VEIN Right 03/10/2013   Procedure: ENDOVEIN HARVEST OF GREATER SAPHENOUS VEIN;  Surgeon: Gardenia Jump, MD;  Location: [facility] OR;  Service: Open Heart Surgery;  Laterality: Right;   FRACTURE SURGERY     L foot- /  w screw    INTRAOPERATIVE TRANSESOPHAGEAL ECHOCARDIOGRAM N/A 03/10/2013   Procedure: INTRAOPERATIVE TRANSESOPHAGEAL ECHOCARDIOGRAM;  Surgeon: Gardenia Jump, MD;  Location: [facility] Main OR;  Service: Open Heart Surgery;  Laterality: N/A;   LEFT HEART CATHETERIZATION WITH CORONARY/GRAFT ANGIOGRAM N/A 09/30/2013   Procedure: LEFT HEART CATHETERIZATION WITH Estella Helling;  Surgeon: Arlander Bellman, MD;  Location: [facility] CATH LAB;  Service: Cardiovascular;  Laterality: N/A;   SHOULDER SURGERY Left    TUBAL LIGATION      Allergies  Allergies  Allergen Reactions   Cefuroxime  Shortness Of Breath    History of Present Illness    Darlene Berry has a PMH of coronary artery disease, hyperlipidemia, hypertension, carotid artery disease, peripheral arterial disease.  Coronary artery disease   S/p CABG in 2014 S/p DES to LCx (non-grafted) Myoview 9/16: low risk  Cath 2017: patent grafts, patent LCx stent >> Med Rx Diabetes mellitus  Hypertension  Hyperlipidemia  Carotid artery disease S/p L CEA  Peripheral arterial disease  S/p L CFA endarterectomy  She was seen in follow-up by Dr. Arlester Ladd on 06/02/2023.  During that time she remained stable from a cardiac standpoint.  She continued to lose weight with Ozempic.  She reported she was using less insulin .  Her blood pressure was well-controlled.  She denied chest pain, palpitations, shortness of breath.  She was trying to maintain her weight.  She noted that her weight had gone down to 117 pounds and she had intentionally gained a few pounds after that.  She was receiving regular lab work from her PCP every 3 months.  Her cholesterol was also managed by her PCP.  She presents to the clinic today for follow-up evaluation and states over the past 2 months she has noticed she has had more stress related to caring for family members.  In the last 1 to 1-1/2 weeks she has noticed more shortness of breath all the time and increased shortness of breath with exertion.  She also describes  a dropping type sensation/feeling.  She notes that she has gained about 26 pounds since last August.  She has been eating more.  She also notes some diarrhea that she attributed to her metformin .  She has been seen and evaluated by GI.  They are recommending upper and lower endoscopy.  She noted that previously her symptoms would happen intermittently and be brief.  We reviewed her medications.  She will have her symptoms are related to her metoprolol .  We reviewed her previous medical history.  Her blood pressure today is 132/60.  Her EKG today shows changes in lead I and aVL.  I will order CBC, BMP, magnesium , echocardiogram, cardiac PET/CT, and plan follow-up in 1-2 months..  Today she denies chest pain,  lower extremity edema, fatigue, palpitations, melena,  hematuria, hemoptysis, diaphoresis, weakness, presyncope, syncope, orthopnea, and PND.  DOE-Notes increased work of breathing at rest and with increased physical activity.  Notes a change over the past 2 months but more so in the last 1-1-1/2 weeks.  Fairly physically active caring for her brother and her great-grandson Order echocardiogram, cardiac PET/CT Increase physical activity as tolerated Heart healthy low-sodium diet Order CBC, BMP, magnesium   Coronary artery disease-no chest pain today.  Denies exertional chest discomfort.  Underwent CABG in 2014 and is status post PCI with DES to her circumflex.  Stress testing 9/16 was low risk.  Cardiac catheterization 2017 showed patent grafts and patent circumflex stent. Heart healthy low-sodium diet Maintain physical activity Continue amlodipine ,  clopidogrel , fenofibrate , isosorbide , lisinopril , rosuvastatin   Hyperlipidemia-managed with PCP High-fiber diet Continue fenofibrate , rosuvastatin   Essential hypertension-BP today 132/60. Maintain blood pressure log Continue metoprolol , Imdur   Carotid artery disease-carotid ultrasound 6/14 showed mild plaque. Continue rosuvastatin , fenofibrate  Repeat carotid ultrasound  Peripheral vascular disease-denies claudication. Maintain physical activity Continue clopidogrel , fenofibrate , rosuvastatin    Disposition: Follow-up with Dr. Arlester Ladd or me in 1-2 months.   Home Medications    Prior to Admission medications   Medication Sig Start Date End Date Taking? Authorizing Provider  ALPRAZolam  (XANAX ) 0.5 MG tablet Take 0.5 mg by mouth daily.    [provider]  amLODipine  (NORVASC ) 10 MG tablet TAKE 1 TABLET BY MOUTH DAILY 01/26/24   Arnoldo Lapping, MD  Ascorbic Acid (VITAMIN C) 1000 MG tablet Take 2,000 mg by mouth daily.    [provider]  clopidogrel  (PLAVIX ) 75 MG tablet TAKE 1 TABLET BY MOUTH AT  BEDTIME 06/11/23   Arnoldo Lapping, MD  COENZYME Q10 PO Take 100 mg by mouth  daily.    [provider]  Continuous Glucose Receiver (FREESTYLE LIBRE 2 READER) DEVI as directed invitro checks blood sugars 4 times a day/Takes insulin  4 times a day 06/03/22   [provider]  Continuous Glucose Sensor (FREESTYLE LIBRE 14 DAY SENSOR) MISC as directed apply once every 2 weeks for 30 days 06/03/22   [provider]  Continuous Glucose Sensor (FREESTYLE LIBRE 2 SENSOR) MISC USE AS DIRECTED EVERY 2 WEEKS 28    [provider]  DULoxetine (CYMBALTA) 30 MG capsule Take 30 mg by mouth daily.  07/26/14   [provider]  fenofibrate  160 MG tablet Take 160 mg by mouth at bedtime.     [provider]  ibuprofen (ADVIL) 800 MG tablet as needed. 10/07/22   [provider]  insulin  glargine (LANTUS ) 100 UNIT/ML injection Inject 50 Units into the skin as needed.    [provider]  IRON PO Take 1 tablet by mouth daily.    [provider]  isosorbide  mononitrate (IMDUR ) 30 MG 24 hr tablet TAKE 1 TABLET BY MOUTH DAILY 01/26/24   Arnoldo Lapping, MD  lisinopril  (ZESTRIL ) 20 MG tablet TAKE 1 TABLET BY MOUTH TWICE  DAILY 06/11/23   Arnoldo Lapping, MD  Magnesium  400 MG CAPS Take by mouth daily in the afternoon.    [provider]  metformin  (FORTAMET ) 1000 MG (OSM) 24 hr tablet Take 1,000 mg by mouth 2 (two) times daily.    [provider]  metoprolol  tartrate (LOPRESSOR ) 50 MG tablet TAKE 1 AND 1/2 TABLETS BY MOUTH  TWICE DAILY 06/11/23   Arnoldo Lapping, MD  nitrofurantoin  (MACRODANTIN ) 100 MG capsule Take 100 mg by mouth at bedtime. With food or milk    [provider]  nitroGLYCERIN  (NITROSTAT ) 0.4 MG SL tablet DISSOLVE 1 TABLET UNDER THE  TONGUE EVERY 5 MINUTES AS NEEDED FOR CHEST PAIN. MAX OF 3 TABLETS IN 15 MINUTES. CALL 911 IF PAIN  PERSISTS. 06/28/22   Arnoldo Lapping, MD  ONE TOUCH ULTRA TEST test strip 1 each by Other route daily.  03/15/15   [provider]  OZEMPIC, 0.25 OR 0.5  MG/DOSE, 2 MG/1.5ML SOPN Inject into the skin once a week. 10/09/20   [provider]  pantoprazole  (PROTONIX ) 40 MG tablet Take 40 mg by mouth daily as needed (INDIGESTION).    [provider]  potassium gluconate 595 (99 K) MG TABS tablet Take 595 mg by mouth daily.    [provider]  rosuvastatin  (CRESTOR ) 40 MG tablet Take 40 mg by mouth daily. 04/10/22   [provider]    Family History    Family History  Problem Relation Age of Onset   Cancer Mother    Diabetes Father    Hypertension Father    Hyperlipidemia Father    Heart disease Father    Leukemia Sister    Diabetes Brother    Hyperlipidemia Brother    Hypertension Brother    Heart disease Brother    She indicated that her mother is deceased. She indicated that her father is deceased. She indicated that the status of her sister is unknown. She indicated that her maternal grandmother is deceased. She indicated that her maternal grandfather is deceased. She indicated that her paternal grandmother is deceased. She indicated that her paternal grandfather is deceased.  Social History    Social History   Socioeconomic History   Marital status: Divorced    Spouse name: Not on file   Number of children: Not on file   Years of education: Not on file   Highest education level: Not on file  Occupational History   Not on file  Tobacco Use   Smoking status: Former    Current packs/day: 0.00    Types: Cigarettes    Quit date: 03/10/2013    Years since quitting: 10.9   Smokeless tobacco: Never  Substance and Sexual Activity   Alcohol use: No    Comment: beer- rare occasion    Drug use: No   Sexual activity: Not on file  Other Topics Concern   Not on file  Social History Narrative   Not on file   Social Drivers of Health   Financial Resource Strain: Not on file  Food Insecurity: No Food Insecurity (09/26/2020)   Received from [facility], [facility]   Hunger Vital Sign     Worried About Running Out of Food in the Last Year: Never true    Ran Out of Food in the Last Year: Never true  Transportation Needs: Not on file  Physical Activity: Not on file  Stress: Not on file  Social Connections: Unknown (01/04/2022)   Received from [facility], [facility]   Social Network    Social Network: Not on file  Intimate Partner Violence: Unknown (11/26/2021)   Received from [facility], [facility]   HITS    Physically Hurt: Not on file    Insult or Talk Down To: Not on file    Threaten Physical Harm: Not on file    Scream or Curse: Not on file     Review of Systems    General:  No chills, fever, night sweats or weight changes.  Cardiovascular:  No chest pain, dyspnea on exertion, edema, orthopnea, palpitations, paroxysmal nocturnal dyspnea. Dermatological: No rash, lesions/masses Respiratory: No cough, dyspnea Urologic: No hematuria, dysuria Abdominal:   No nausea, vomiting, diarrhea, bright red blood per rectum, melena, or hematemesis Neurologic:  No visual changes, wkns, changes in mental status. All other systems reviewed and are otherwise negative except as noted above.  Physical Exam    VS:  BP 132/60   Pulse (!) 42   Ht 5\' 2"  (1.575 m)   Wt 141 lb 6.4 oz (64.1 kg)   SpO2 99%   BMI 25.86 kg/m  , BMI Body mass index is 25.86 kg/m. GEN: Well nourished, well developed, in no acute distress. HEENT: normal. Neck: Supple, no JVD, carotid bruits, or masses. Cardiac: RRR,  no murmurs, rubs, or gallops. No clubbing, cyanosis, edema.  Radials/DP/PT 2+ and equal bilaterally.  Respiratory:  Respirations regular and unlabored, clear to auscultation bilaterally. GI: Soft, nontender, nondistended, BS + x 4. MS: no deformity or atrophy. Skin: warm and dry, no rash. Neuro:  Strength and sensation are intact. Psych: Normal affect.  Accessory Clinical Findings    Recent Labs: No results found for requested labs within last 365 days.   Recent Lipid  Panel    Component Value Date/Time   CHOL 193 09/24/2013 1616   TRIG 229 (H) 09/24/2013 1616   HDL 37 (L) 09/24/2013 1616   CHOLHDL 5.2 09/24/2013 1616   VLDL 46 (H) 09/24/2013 1616   LDLCALC 110 (H) 09/24/2013 1616         ECG personally reviewed by me today- EKG Interpretation Date/Time:  Monday February 02 2024 10:03:24 EDT Ventricular Rate:  48 PR Interval:  216 QRS Duration:  88 QT Interval:  466 QTC Calculation: 416 R Axis:   39  Text Interpretation: Sinus bradycardia with 1st degree A-V block with Premature atrial complexes T wave abnormality, consider lateral ischemia When compared with ECG of 02-Jun-2023 14:40, Premature atrial complexes are now Present Questionable change in QRS axis T wave inversion now evident in Lateral leads Confirmed by Lawana Pray [phone]) on 02/02/2024 10:09:14 AM   LHC 06/05/2016 1. 3 vessel CAD 2. S/P CABG with continued patency of the LIMA-LAD and SVG-distal RCA 3. Continued patency of the stented segment in the left circumflex 4. Normal LV function   Recommendation: medical therapy for residual CAD, adjustment of antihypertensive Rx, risk reduction/tobacco cessation  Diagnostic Dominance: Right  Intervention     Assessment & Plan   1.  DOE-Notes increased work of breathing at rest and with increased physical activity.  Notes a change over the past 2 months but more so in the last 1-1-1/2 weeks.  Fairly physically active caring for her brother and her great-grandson.  EKG changes noted in lead I and aVL. Order echocardiogram, cardiac PET/CT Increase physical activity as tolerated Heart healthy low-sodium diet Order CBC, BMP, magnesium   Informed Consent   Shared Decision Making/Informed Consent The risks [chest pain, shortness of breath, cardiac arrhythmias, dizziness, blood pressure fluctuations, myocardial infarction, stroke/transient ischemic attack, nausea, vomiting, allergic reaction, radiation exposure, metallic taste sensation  and life-threatening complications (estimated to be 1 in 10,000)], benefits (risk stratification, diagnosing coronary artery disease, treatment guidance) and alternatives of a cardiac PET stress test were discussed in detail with Ms. Thackston and she agrees to proceed.      Coronary artery disease-no chest pain today.  Denies exertional chest discomfort.  Underwent CABG in 2014 and is status post PCI with DES to her circumflex.  Stress testing 9/16 was low risk.  Cardiac catheterization 2017 showed patent grafts and patent circumflex stent. Heart healthy low-sodium diet Maintain physical activity Continue amlodipine , clopidogrel , fenofibrate , isosorbide , lisinopril , rosuvastatin   Hyperlipidemia-managed with PCP High-fiber diet Continue fenofibrate , rosuvastatin   Essential hypertension-BP today 132/60. Maintain blood pressure log Continue metoprolol , Imdur   Carotid artery disease-carotid ultrasound 6/14 showed mild plaque. Continue rosuvastatin , fenofibrate  Repeat carotid ultrasound  Peripheral vascular disease-denies claudication. Maintain physical activity Continue clopidogrel , fenofibrate , rosuvastatin    Disposition: Follow-up with Dr. Arlester Ladd or me in 1-2 months.   Chet Cota. Korynn Kenedy NP-C     02/02/2024, 10:09 AM [facility] [address] Office [phone] Fax [phone]    I spent 15 minutes examining this patient, reviewing medications, and  using patient centered shared decision making involving their cardiac care.   I spent  20 minutes reviewing past medical history,  medications, and prior cardiac tests.

## 2024-02-02 ENCOUNTER — Encounter: Payer: Self-pay | Admitting: General Practice

## 2024-02-02 ENCOUNTER — Ambulatory Visit: Attending: General Practice | Admitting: General Practice

## 2024-02-02 VITALS — BP 132/60 | HR 42 | Ht 62.0 in | Wt 141.4 lb

## 2024-02-02 DIAGNOSIS — I6523 Occlusion and stenosis of bilateral carotid arteries: Secondary | ICD-10-CM

## 2024-02-02 DIAGNOSIS — I25119 Atherosclerotic heart disease of native coronary artery with unspecified angina pectoris: Secondary | ICD-10-CM | POA: Diagnosis not present

## 2024-02-02 DIAGNOSIS — R0609 Other forms of dyspnea: Secondary | ICD-10-CM

## 2024-02-02 DIAGNOSIS — E782 Mixed hyperlipidemia: Secondary | ICD-10-CM | POA: Diagnosis not present

## 2024-02-02 DIAGNOSIS — I10 Essential (primary) hypertension: Secondary | ICD-10-CM | POA: Diagnosis not present

## 2024-02-02 DIAGNOSIS — I739 Peripheral vascular disease, unspecified: Secondary | ICD-10-CM

## 2024-02-02 MED ORDER — NITROGLYCERIN 0.4 MG SL SUBL: SUBLINGUAL_TABLET | SUBLINGUAL | 11 refills | Status: AC

## 2024-02-02 NOTE — Patient Instructions (Signed)
 Medication Instructions:  DECREASE METOPROLOL  TO 50 MG IN THE MORNING AND CONTINUE 75 MG AT NIGHT  *If you need a refill on your cardiac medications before your next appointment, please call your pharmacy*  Lab Work: BMET, MAGNESIUM  AND CBC TODAY If you have labs (blood work) drawn today and your tests are completely normal, you will receive your results only by: MyChart Message (if you have MyChart) OR A paper copy in the mail If you have any lab test that is abnormal or we need to change your treatment, we will call you to review the results.  Testing/Procedures:[address] Your physician has requested that you have an echocardiogram. Echocardiography is a painless test that uses sound waves to create images of your heart. It provides your doctor with information about the size and shape of your heart and how well your heart's chambers and valves are working. This procedure takes approximately one hour. There are no restrictions for this procedure. Please do NOT wear cologne, perfume, aftershave, or lotions (deodorant is allowed). Please arrive 15 minutes prior to your appointment time.  Please note: We ask at that you not bring children with you during ultrasound (echo/ vascular) testing. Due to room size and safety concerns, children are not allowed in the ultrasound rooms during exams. Our front office staff cannot provide observation of children in our lobby area while testing is being conducted. An adult accompanying a patient to their appointment will only be allowed in the ultrasound room at the discretion of the ultrasound technician under special circumstances. We apologize for any inconvenience.      Please report to Radiology at the [facility] Main Entrance 30 minutes early for your test.  [address] [place], [place] [address]  How to Prepare for Your Cardiac PET/CT Stress Test:  Nothing to eat or drink, except water, 3 hours prior to arrival time.  NO  caffeine/decaffeinated products, or chocolate 12 hours prior to arrival. (Please note decaffeinated beverages (teas/coffees) still contain caffeine).  If you have caffeine within 12 hours prior, the test will need to be rescheduled.  Medication instructions: Do not take erectile dysfunction medications for 72 hours prior to test (sildenafil, tadalafil) Do not take nitrates (isosorbide  mononitrate, Ranexa) the day before or day of test Do not take tamsulosin the day before or morning of test Hold theophylline containing medications for 12 hours. Hold Dipyridamole 48 hours prior to the test.  Diabetic Preparation: If able to eat breakfast prior to 3 hour fasting, you may take all medications, including your insulin . Do not worry if you miss your breakfast dose of insulin  - start at your next meal. If you do not eat prior to 3 hour fast-Hold all diabetes (oral and insulin ) medications. Patients who wear a continuous glucose monitor MUST remove the device prior to scanning.  You may take your remaining medications with water.  NO perfume, cologne or lotion on chest or abdomen area. FEMALES - Please avoid wearing dresses to this appointment.  Total time is 1 to 2 hours; you may want to bring reading material for the waiting time.  IF YOU THINK YOU MAY BE PREGNANT, OR ARE NURSING PLEASE INFORM THE TECHNOLOGIST.  In preparation for your appointment, medication and supplies will be purchased.  Appointment availability is limited, so if you need to cancel or reschedule, please call the Radiology Department Scheduler at [phone] 24 hours in advance to avoid a cancellation fee of $100.00  What to Expect When you  Arrive:  Once you arrive and check in for your appointment, you will be taken to a preparation room within the Radiology Department.  A technologist or Nurse will obtain your medical history, verify that you are correctly prepped for the exam, and explain the procedure.  Afterwards, an  IV will be started in your arm and electrodes will be placed on your skin for EKG monitoring during the stress portion of the exam. Then you will be escorted to the PET/CT scanner.  There, staff will get you positioned on the scanner and obtain a blood pressure and EKG.  During the exam, you will continue to be connected to the EKG and blood pressure machines.  A small, safe amount of a radioactive tracer will be injected in your IV to obtain a series of pictures of your heart along with an injection of a stress agent.    After your Exam:  It is recommended that you eat a meal and drink a caffeinated beverage to counter act any effects of the stress agent.  Drink plenty of fluids for the remainder of the day and urinate frequently for the first couple of hours after the exam.  Your doctor will inform you of your test results within 7-10 business days.  For more information and frequently asked questions, please visit our website: [email or web address]  For questions about your test or how to prepare for your test, please call: Cardiac Imaging Nurse Navigators Office: [phone]  Follow-Up: At [facility], you and your health needs are our priority.  As part of our continuing mission to provide you with exceptional heart care, our providers are all part of one team.  This team includes your primary Cardiologist (physician) and Advanced Practice Providers or APPs (Physician Assistants and Nurse Practitioners) who all work together to provide you with the care you need, when you need it.  Your next appointment:   FOLLOW UP AFTER TEST   Provider:   Arnoldo Lapping, MD or Lawana Pray, NP

## 2024-02-03 ENCOUNTER — Ambulatory Visit: Payer: Self-pay | Admitting: General Practice

## 2024-02-03 LAB — CBC
Hematocrit: 39.3 % (ref 34.0–46.6)
Hemoglobin: 13 g/dL (ref 11.1–15.9)
MCH: 29.7 pg (ref 26.6–33.0)
MCHC: 33.1 g/dL (ref 31.5–35.7)
MCV: 90 fL (ref 79–97)
Platelets: 218 10*3/uL (ref 150–450)
RBC: 4.37 x10E6/uL (ref 3.77–5.28)
RDW: 12.2 % (ref 11.7–15.4)
WBC: 7.5 10*3/uL (ref 3.4–10.8)

## 2024-02-03 LAB — BASIC METABOLIC PANEL WITH GFR
BUN/Creatinine Ratio: 16 (ref 12–28)
BUN: 18 mg/dL (ref 8–27)
CO2: 19 mmol/L — ABNORMAL LOW (ref 20–29)
Calcium: 9.8 mg/dL (ref 8.7–10.3)
Chloride: 100 mmol/L (ref 96–106)
Creatinine, Ser: 1.15 mg/dL — ABNORMAL HIGH (ref 0.57–1.00)
Glucose: 165 mg/dL — ABNORMAL HIGH (ref 70–99)
Potassium: 5.1 mmol/L (ref 3.5–5.2)
Sodium: 137 mmol/L (ref 134–144)
eGFR: 50 mL/min/{1.73_m2} — ABNORMAL LOW (ref >59)

## 2024-02-03 LAB — MAGNESIUM: Magnesium: 2.1 mg/dL (ref 1.6–2.3)

## 2024-02-03 NOTE — Telephone Encounter (Signed)
 Call transferred from call center, Results called to patient who verbalizes understanding!

## 2024-02-11 ENCOUNTER — Observation Stay (HOSPITAL_COMMUNITY)
Admission: EM | Admit: 2024-02-11 | Discharge: 2024-02-13 | Disposition: A | Discharge status: Home / Self Care | Attending: Emergency Medicine | Admitting: Emergency Medicine

## 2024-02-11 ENCOUNTER — Other Ambulatory Visit: Payer: Self-pay

## 2024-02-11 ENCOUNTER — Emergency Department (HOSPITAL_COMMUNITY)

## 2024-02-11 DIAGNOSIS — I160 Hypertensive urgency: Secondary | ICD-10-CM | POA: Diagnosis not present

## 2024-02-11 DIAGNOSIS — I10 Essential (primary) hypertension: Secondary | ICD-10-CM | POA: Diagnosis not present

## 2024-02-11 DIAGNOSIS — N390 Urinary tract infection, site not specified: Secondary | ICD-10-CM | POA: Diagnosis present

## 2024-02-11 DIAGNOSIS — Z794 Long term (current) use of insulin: Secondary | ICD-10-CM | POA: Insufficient documentation

## 2024-02-11 DIAGNOSIS — I25110 Atherosclerotic heart disease of native coronary artery with unstable angina pectoris: Secondary | ICD-10-CM | POA: Diagnosis not present

## 2024-02-11 DIAGNOSIS — Z7984 Long term (current) use of oral hypoglycemic drugs: Secondary | ICD-10-CM | POA: Diagnosis not present

## 2024-02-11 DIAGNOSIS — E118 Type 2 diabetes mellitus with unspecified complications: Secondary | ICD-10-CM | POA: Diagnosis present

## 2024-02-11 DIAGNOSIS — Z79899 Other long term (current) drug therapy: Secondary | ICD-10-CM | POA: Diagnosis not present

## 2024-02-11 DIAGNOSIS — I779 Disorder of arteries and arterioles, unspecified: Secondary | ICD-10-CM | POA: Diagnosis present

## 2024-02-11 DIAGNOSIS — Z7902 Long term (current) use of antithrombotics/antiplatelets: Secondary | ICD-10-CM | POA: Diagnosis not present

## 2024-02-11 DIAGNOSIS — I6529 Occlusion and stenosis of unspecified carotid artery: Secondary | ICD-10-CM | POA: Insufficient documentation

## 2024-02-11 DIAGNOSIS — Z951 Presence of aortocoronary bypass graft: Secondary | ICD-10-CM | POA: Insufficient documentation

## 2024-02-11 DIAGNOSIS — Z85828 Personal history of other malignant neoplasm of skin: Secondary | ICD-10-CM | POA: Diagnosis not present

## 2024-02-11 DIAGNOSIS — Z87891 Personal history of nicotine dependence: Secondary | ICD-10-CM | POA: Diagnosis not present

## 2024-02-11 DIAGNOSIS — E119 Type 2 diabetes mellitus without complications: Secondary | ICD-10-CM | POA: Diagnosis not present

## 2024-02-11 DIAGNOSIS — E781 Pure hyperglyceridemia: Secondary | ICD-10-CM | POA: Diagnosis present

## 2024-02-11 DIAGNOSIS — I200 Unstable angina: Secondary | ICD-10-CM | POA: Diagnosis not present

## 2024-02-11 DIAGNOSIS — R0789 Other chest pain: Principal | ICD-10-CM

## 2024-02-11 DIAGNOSIS — R079 Chest pain, unspecified: Secondary | ICD-10-CM | POA: Diagnosis present

## 2024-02-11 LAB — BASIC METABOLIC PANEL WITH GFR
Anion gap: 13 (ref 5–15)
BUN: 23 mg/dL (ref 8–23)
CO2: 22 mmol/L (ref 22–32)
Calcium: 10.4 mg/dL — ABNORMAL HIGH (ref 8.9–10.3)
Chloride: 102 mmol/L (ref 98–111)
Creatinine, Ser: 1.23 mg/dL — ABNORMAL HIGH (ref 0.44–1.00)
GFR, Estimated: 46 mL/min — ABNORMAL LOW (ref >60)
Glucose, Bld: 203 mg/dL — ABNORMAL HIGH (ref 70–99)
Potassium: 4.8 mmol/L (ref 3.5–5.1)
Sodium: 137 mmol/L (ref 135–145)

## 2024-02-11 LAB — CBG MONITORING, ED
Glucose-Capillary: 215 mg/dL — ABNORMAL HIGH (ref 70–99)
Glucose-Capillary: 106 mg/dL — ABNORMAL HIGH (ref 70–99)
Glucose-Capillary: 80 mg/dL (ref 70–99)
Glucose-Capillary: 303 mg/dL — ABNORMAL HIGH (ref 70–99)

## 2024-02-11 LAB — CBC
HCT: 39.9 % (ref 36.0–46.0)
HCT: 35.5 % — ABNORMAL LOW (ref 36.0–46.0)
Hemoglobin: 13.7 g/dL (ref 12.0–15.0)
Hemoglobin: 12.3 g/dL (ref 12.0–15.0)
MCH: 29.5 pg (ref 26.0–34.0)
MCH: 30 pg (ref 26.0–34.0)
MCHC: 34.3 g/dL (ref 30.0–36.0)
MCHC: 34.6 g/dL (ref 30.0–36.0)
MCV: 85.8 fL (ref 80.0–100.0)
MCV: 86.6 fL (ref 80.0–100.0)
Platelets: 221 10*3/uL (ref 150–400)
Platelets: 183 10*3/uL (ref 150–400)
RBC: 4.65 MIL/uL (ref 3.87–5.11)
RBC: 4.1 MIL/uL (ref 3.87–5.11)
RDW: 12.3 % (ref 11.5–15.5)
RDW: 12.3 % (ref 11.5–15.5)
WBC: 8.7 10*3/uL (ref 4.0–10.5)
WBC: 7.2 10*3/uL (ref 4.0–10.5)
nRBC: 0 % (ref 0.0–0.2)
nRBC: 0 % (ref 0.0–0.2)

## 2024-02-11 LAB — TROPONIN I (HIGH SENSITIVITY)
Troponin I (High Sensitivity): 8 ng/L (ref <18)
Troponin I (High Sensitivity): 8 ng/L (ref <18)

## 2024-02-11 LAB — HEMOGLOBIN A1C
Hgb A1c MFr Bld: 7.4 % — ABNORMAL HIGH (ref 4.8–5.6)
Mean Plasma Glucose: 165.68 mg/dL

## 2024-02-11 LAB — CREATININE, SERUM
Creatinine, Ser: 1.44 mg/dL — ABNORMAL HIGH (ref 0.44–1.00)
GFR, Estimated: 38 mL/min — ABNORMAL LOW (ref >60)

## 2024-02-11 MED ORDER — NITROFURANTOIN MONOHYD MACRO 100 MG PO CAPS
100.0000 mg | ORAL_CAPSULE | Freq: Every day | ORAL | Status: DC
  Administered 2024-02-11 – 2024-02-12 (×2): 100 mg via ORAL
  Filled 2024-02-11 (×4): qty 1

## 2024-02-11 MED ORDER — INSULIN ASPART 100 UNIT/ML IJ SOLN
0.0000 [IU] | Freq: Three times a day (TID) | INTRAMUSCULAR | Status: DC
  Administered 2024-02-12 – 2024-02-13 (×2): 3 [IU] via SUBCUTANEOUS
  Administered 2024-02-13: 7 [IU] via SUBCUTANEOUS

## 2024-02-11 MED ORDER — HEPARIN SODIUM (PORCINE) 5000 UNIT/ML IJ SOLN
5000.0000 [IU] | Freq: Three times a day (TID) | INTRAMUSCULAR | Status: DC
  Administered 2024-02-11 – 2024-02-12 (×2): 5000 [IU] via SUBCUTANEOUS
  Filled 2024-02-11 (×2): qty 1

## 2024-02-11 MED ORDER — DULOXETINE HCL 30 MG PO CPEP
30.0000 mg | ORAL_CAPSULE | Freq: Every day | ORAL | Status: DC
  Administered 2024-02-12 – 2024-02-13 (×2): 30 mg via ORAL
  Filled 2024-02-11 (×2): qty 1

## 2024-02-11 MED ORDER — FENOFIBRATE 160 MG PO TABS
160.0000 mg | ORAL_TABLET | Freq: Every day | ORAL | Status: DC
  Administered 2024-02-12: 160 mg via ORAL
  Filled 2024-02-11 (×3): qty 1

## 2024-02-11 MED ORDER — ISOSORBIDE MONONITRATE ER 30 MG PO TB24
30.0000 mg | ORAL_TABLET | Freq: Every day | ORAL | Status: DC
  Administered 2024-02-12: 30 mg via ORAL
  Filled 2024-02-11: qty 1

## 2024-02-11 MED ORDER — ASPIRIN 81 MG PO TBEC
81.0000 mg | DELAYED_RELEASE_TABLET | Freq: Every day | ORAL | Status: DC
  Administered 2024-02-12 – 2024-02-13 (×2): 81 mg via ORAL
  Filled 2024-02-11 (×2): qty 1

## 2024-02-11 MED ORDER — NITROGLYCERIN 0.4 MG SL SUBL
0.4000 mg | SUBLINGUAL_TABLET | SUBLINGUAL | Status: DC | PRN
  Administered 2024-02-12 (×4): 0.4 mg via SUBLINGUAL
  Filled 2024-02-11: qty 1

## 2024-02-11 MED ORDER — ASPIRIN 81 MG PO CHEW
324.0000 mg | CHEWABLE_TABLET | Freq: Once | ORAL | Status: AC
  Administered 2024-02-11: 324 mg via ORAL
  Filled 2024-02-11: qty 4

## 2024-02-11 MED ORDER — ACETAMINOPHEN 325 MG PO TABS: 650.0000 mg | ORAL_TABLET | ORAL | Status: DC | PRN

## 2024-02-11 MED ORDER — LISINOPRIL 20 MG PO TABS
20.0000 mg | ORAL_TABLET | Freq: Every day | ORAL | Status: DC
  Administered 2024-02-12: 20 mg via ORAL
  Filled 2024-02-11: qty 1

## 2024-02-11 MED ORDER — EMPAGLIFLOZIN 10 MG PO TABS
10.0000 mg | ORAL_TABLET | Freq: Every day | ORAL | Status: DC
  Administered 2024-02-12 – 2024-02-13 (×2): 10 mg via ORAL
  Filled 2024-02-11 (×2): qty 1

## 2024-02-11 MED ORDER — CLOPIDOGREL BISULFATE 75 MG PO TABS
75.0000 mg | ORAL_TABLET | Freq: Every day | ORAL | Status: DC
  Administered 2024-02-11 – 2024-02-12 (×2): 75 mg via ORAL
  Filled 2024-02-11 (×2): qty 1

## 2024-02-11 MED ORDER — ROSUVASTATIN CALCIUM 20 MG PO TABS
40.0000 mg | ORAL_TABLET | Freq: Every day | ORAL | Status: DC
  Administered 2024-02-11 – 2024-02-12 (×2): 40 mg via ORAL
  Filled 2024-02-11 (×2): qty 2

## 2024-02-11 MED ORDER — AMLODIPINE BESYLATE 10 MG PO TABS
10.0000 mg | ORAL_TABLET | Freq: Every day | ORAL | Status: DC
  Administered 2024-02-12: 10 mg via ORAL
  Filled 2024-02-11: qty 2

## 2024-02-11 NOTE — H&P (Signed)
 Cardiology Admission History and Physical   Patient ID: Darlene Berry MRN: [id number]; DOB: [birth year]   Admission date: 02/11/2024  PCP:  Herb Loges, PA-C   [facility] Providers Cardiologist:  Arnoldo Lapping, MD  Cardiology APP:  Gabino Joe, PA-C      Chief Complaint: Chest pain  Patient Profile: Darlene Berry Service is a 73 y.o. female with known CAD, who is being seen 02/11/2024 for the evaluation of chest pain and shortness of breath.  History of Present Illness: Darlene Berry is well-known to me from the outpatient setting.  She presents with a 2-week history of progressive chest discomfort and shortness of breath.  She describes weakness and generalized fatigue as well as dizziness.  She has been having chest discomfort that feels like a pressure in the sternal area and it has been responsive to sublingual nitroglycerin .  States that she has been using nitroglycerin  frequently over the last 2 weeks.  She was seen by Lawana Pray in the office and set up for an echocardiogram and cardiac PET stress test.  Her echocardiogram is scheduled next month and her stress test is scheduled in early September.  She became more concerned about her symptoms as they appear to be worsening and she saw her primary care physician.  It was recommended that she go to the emergency room because of her worsening symptoms.  She reports no other interval changes and specifically denies orthopnea, PND, or leg swelling.  The patient is a non-smoker.  She has no other complaints at this time.   Past Medical History:  Diagnosis Date   Acute cystitis without hematuria 10/29/2016   Anemia    after tubal preg. - rec'd bld. transfusion     Anxiety    Arthritis    CAD (coronary artery disease)    a. s/p CABG in 2014 // b. LHC 2/15: grafts patent; OM1 80% >> PCI: Xience DES to OM1 // c. Myoview 9/16: no ischemia or scar, EF 67; Low Risk // d. LHC 10/17: pLAD 60, D1 50, Lat D1 70, oLCx 40, mLCx stent  patent, pRCA 90, mRCA 99, EF 65 >> Med Rx.   Carotid artery disease (HCC)    Carotid US  1/16: bilateral ICA 1-39 >> FU 08/2016 // Carotid US  1/18: 0-39% bilat ICA; Elevated blood flow velocities in the subclavian arteries, bilaterally; FU 1 year.   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 Echo 6/14 (done at [facility]):  Mild LVH, EF 65-60%, Gr 2 DD.   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   LIMA to LAD, SVG to RCA, EVH via right thigh   Sleep apnea    uses CPAP q night - assessment, [facility]- [facility]    Type II or unspecified type diabetes mellitus without mention of complication, not stated as uncontrolled 02/17/2009   Overview:  Type 2  Diabetes Mellitus - Uncomplicated, Controlled   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   Past Surgical History:  Procedure Laterality Date   ABDOMINAL HYSTERECTOMY     APPENDECTOMY     BREAST SURGERY     R breast- biopsy- benign    CARDIAC CATHETERIZATION     x 3   CARDIAC CATHETERIZATION N/A 06/05/2016   Procedure: Left Heart Cath and Cors/Grafts Angiography;  Surgeon: Arnoldo Lapping, MD;  Location: [facility] INVASIVE CV LAB;  Service: Cardiovascular;  Laterality: N/A;   CAROTID ENDARTERECTOMY Left    CORONARY ARTERY BYPASS GRAFT N/A 03/10/2013   Procedure: CORONARY ARTERY BYPASS GRAFTING (CABG);  Surgeon: Gardenia Jump, MD;  Location: [facility] OR;  Service: Open Heart Surgery;  Laterality: N/A;  x2 using right greater saphenous vein and left internal  mammary.    CORONARY STENT PLACEMENT  09/30/2013   DES  LEFT CIRCUMFLEX    DR Wren Pryce   ENDOVEIN HARVEST OF GREATER SAPHENOUS VEIN Right 03/10/2013   Procedure: ENDOVEIN HARVEST OF GREATER SAPHENOUS VEIN;  Surgeon: Gardenia Jump, MD;  Location: [facility] OR;  Service: Open Heart Surgery;  Laterality: Right;   FRACTURE SURGERY     L foot- /w screw    INTRAOPERATIVE TRANSESOPHAGEAL ECHOCARDIOGRAM N/A 03/10/2013   Procedure: INTRAOPERATIVE TRANSESOPHAGEAL ECHOCARDIOGRAM;  Surgeon: Gardenia Jump, MD;  Location: [facility] OR;  Service: Open Heart Surgery;  Laterality: N/A;   LEFT HEART CATHETERIZATION WITH CORONARY/GRAFT ANGIOGRAM N/A 09/30/2013   Procedure: LEFT HEART CATHETERIZATION WITH Estella Helling;  Surgeon: Arlander Bellman, MD;  Location: [facility] CATH LAB;  Service: Cardiovascular;  Laterality: N/A;   SHOULDER SURGERY Left    TUBAL LIGATION       Medications Prior to Admission: Prior to Admission medications   Medication Sig Start Date End Date Taking? Authorizing Provider  ALPRAZolam  (XANAX ) 0.5 MG tablet Take 0.5 mg by mouth at bedtime.   Yes [provider]  amLODipine  (NORVASC ) 10 MG tablet TAKE 1 TABLET BY MOUTH DAILY 01/26/24  Yes Arnoldo Lapping, MD  Ascorbic Acid (VITAMIN C) 1000 MG tablet Take 1,000 mg by mouth daily.   Yes [provider]  clopidogrel  (PLAVIX ) 75 MG tablet TAKE 1 TABLET BY MOUTH AT  BEDTIME Patient taking differently: Take 75 mg by mouth daily at 6 PM. 06/11/23  Yes Arnoldo Lapping, MD  Continuous Glucose Sensor (FREESTYLE LIBRE 2 SENSOR) MISC Inject 1 Device into the skin every 14 (fourteen) days.   Yes [provider]  DULoxetine (CYMBALTA) 30 MG capsule Take 30 mg by mouth daily.  07/26/14  Yes [provider]  fenofibrate  160 MG tablet Take 160 mg by mouth daily at 6 PM.   Yes [provider]  ferrous sulfate  325 (65 FE) MG tablet Take 325 mg by mouth daily with breakfast.   Yes [provider]  HUMALOG KWIKPEN 100  UNIT/ML KwikPen Inject 8-20 Units into the skin See admin instructions. Inject 8-20 units into the skin three times a day with meals, per sliding scale 12/24/23  Yes [provider]  insulin  glargine (LANTUS ) 100 UNIT/ML injection Inject 50-60 Units into the skin See admin instructions. Inject 50-60 units into the skin MID-MORNING   Yes [provider]  isosorbide  mononitrate (IMDUR ) 30 MG 24 hr tablet TAKE 1 TABLET BY MOUTH DAILY 01/26/24  Yes Arnoldo Lapping, MD  JARDIANCE 10 MG TABS tablet Take 10 mg by mouth daily. 01/28/24  Yes [provider]  lisinopril  (ZESTRIL ) 20 MG tablet TAKE 1 TABLET  BY MOUTH TWICE  DAILY Patient taking differently: Take 20 mg by mouth in the morning and at bedtime. 06/11/23  Yes Arnoldo Lapping, MD  magnesium  oxide (MAG-OX) 400 (240 Mg) MG tablet Take 400 mg by mouth at bedtime.   Yes [provider]  metFORMIN  (GLUCOPHAGE -XR) 500 MG 24 hr tablet Take 500 mg by mouth every evening.   Yes [provider]  nitrofurantoin , macrocrystal-monohydrate, (MACROBID ) 100 MG capsule Take 100 mg by mouth at bedtime.   Yes [provider]  nitroGLYCERIN  (NITROSTAT ) 0.4 MG SL tablet DISSOLVE 1 TABLET UNDER THE  TONGUE EVERY 5 MINUTES AS NEEDED FOR CHEST PAIN. MAX OF 3 TABLETS IN 15 MINUTES. CALL 911 IF PAIN  PERSISTS. Patient taking differently: Place 0.4 mg under the tongue every 5 (five) minutes x 3 doses as needed (CALL 9-1-1, IF NO RELIEF). 02/02/24  Yes Cleaver, Chet Cota, NP  pantoprazole  (PROTONIX ) 40 MG tablet Take 40 mg by mouth daily as needed (INDIGESTION).   Yes [provider]  potassium gluconate 595 (99 K) MG TABS tablet Take 595 mg by mouth at bedtime.   Yes [provider]  rosuvastatin  (CRESTOR ) 40 MG tablet Take 40 mg by mouth at bedtime. 04/10/22  Yes [provider]  Semaglutide, 1 MG/DOSE, (OZEMPIC, 1 MG/DOSE,) 4 MG/3ML SOPN Inject 1 mg into the skin every Wednesday.   Yes [provider]  TYLENOL  8 HOUR ARTHRITIS PAIN 650 MG CR tablet Take 1,300 mg by mouth at bedtime as needed for pain.   Yes [provider]  Continuous Glucose Receiver (FREESTYLE LIBRE 2 READER) DEVI as directed invitro checks blood sugars 4 times a day/Takes insulin  4 times a day 06/03/22   [provider]  metoprolol  tartrate (LOPRESSOR ) 50 MG tablet TAKE 1 AND 1/2 TABLETS BY MOUTH  TWICE DAILY Patient not taking: Reported on 02/11/2024 06/11/23   Arnoldo Lapping, MD  ONE TOUCH ULTRA TEST test strip 1 each by Other route daily.  03/15/15   [provider]     Allergies:    Allergies  Allergen Reactions   Cefuroxime  Shortness Of Breath   Metoprolol  Tartrate Other (See Comments)    Could NOT tolerate- dropped the heart rate very low and made the heart hurt    Social History:   Social History   Socioeconomic History   Marital status: Divorced    Spouse name: Not on file   Number of children: Not on file   Years of education: Not on file   Highest education level: Not on file  Occupational History   Not on file  Tobacco Use   Smoking status: Former    Current packs/day: 0.00    Types: Cigarettes    Quit date: 03/10/2013    Years since quitting: 10.9   Smokeless tobacco: Never  Substance and Sexual Activity   Alcohol use: No    Comment: beer- rare occasion    Drug use: No   Sexual activity: Not on file  Other Topics Concern   Not on file  Social History Narrative   Not on file   Social Drivers of Health   Financial Resource Strain: Not on file  Food Insecurity: No Food Insecurity (09/26/2020)   Received from [facility]   Hunger Vital Sign    Within the past 12 months, you worried that your food would run out before you got the money to buy more.: Never true    Within the past 12 months, the food you  bought just didn't last and you didn't have money to get more.: Never true  Transportation Needs: Not on file  Physical Activity: Not on file  Stress: Not  on file  Social Connections: Unknown (01/04/2022)   Received from [facility]   Social Network    Social Network: Not on file  Intimate Partner Violence: Unknown (11/26/2021)   Received from [facility]   HITS    Physically Hurt: Not on file    Insult or Talk Down To: Not on file    Threaten Physical Harm: Not on file    Scream or Curse: Not on file     Family History:   The patient's family history includes Cancer in her mother; Diabetes in her brother and father; Heart disease in her brother and father; Hyperlipidemia in her brother and father; Hypertension in her brother and father; Leukemia in her sister.    ROS:  Please see the history of present illness.  All other ROS reviewed and negative.     Physical Exam/Data: Vitals:   02/11/24 1123 02/11/24 1432 02/11/24 1530 02/11/24 1615  BP: (!) 149/68 (!) 180/93 (!) 142/66 (!) 148/64  Pulse: 71 86 81 83  Resp: 16 18 13 14   Temp: 97.8 F (36.6 C)     TempSrc:      SpO2: 100% 100% 100% 100%   No intake or output data in the 24 hours ending 02/11/24 1904    02/02/2024    9:37 AM 06/02/2023    2:46 PM 05/28/2022   10:13 AM  Last 3 Weights  Weight (lbs) 141 lb 6.4 oz 121 lb 6.4 oz 125 lb  Weight (kg) 64.139 kg 55.067 kg 56.7 kg     There is no height or weight on file to calculate BMI.  General:  Well nourished, well developed, in no acute distress HEENT: normal Neck: no JVD Vascular: Bilateral carotid bruits right greater than left; Distal pulses 2+ bilaterally.  Right femoral pulse 2+ Cardiac:  normal S1, S2; RRR; 2/6 systolic ejection murmur the right upper sternal border Lungs:  clear to auscultation bilaterally, no wheezing, rhonchi or rales  Abd: soft, nontender, no hepatomegaly  Ext: no edema Musculoskeletal:  No deformities, BUE and BLE strength normal and equal Skin: warm and dry  Neuro:  CNs 2-12 intact, no focal abnormalities noted Psych:  Normal affect   EKG:  The ECG that was done  was personally reviewed  and demonstrates normal sinus rhythm with no acute ischemic changes  Relevant CV Studies: Cardiac catheterization 29-Feb-2016: 1. 3 vessel CAD 2. S/P CABG with continued patency of the LIMA-LAD and SVG-distal RCA 3. Continued patency of the stented segment in the left circumflex 4. Normal LV function   Recommendation: medical therapy for residual CAD, adjustment of antihypertensive Rx, risk reduction/tobacco cessation  Laboratory Data: High Sensitivity Troponin:   Recent Labs  Lab 02/11/24 1149 02/11/24 1349  TROPONINIHS 8 8      Chemistry Recent Labs  Lab 02/11/24 1149  NA 137  K 4.8  CL 102  CO2 22  GLUCOSE 203*  BUN 23  CREATININE 1.23*  CALCIUM  10.4*  GFRNONAA 46*  ANIONGAP 13    No results for input(s): PROT, ALBUMIN , AST, ALT, ALKPHOS, BILITOT in the last 168 hours. Lipids No results for input(s): CHOL, TRIG, HDL, LABVLDL, LDLCALC, CHOLHDL in the last 168 hours. Hematology Recent Labs  Lab 02/11/24 1149  WBC 8.7  RBC 4.65  HGB 13.7  HCT 39.9  MCV 85.8  MCH 29.5  MCHC 34.3  RDW 12.3  PLT 221   Thyroid No results for input(s): TSH, FREET4 in the last 168 hours. BNPNo results for input(s): BNP, PROBNP in the last 168 hours.  DDimer No results for input(s): DDIMER in the last 168 hours.  Radiology/Studies:  DG Chest 1 View Result Date: 02/11/2024 CLINICAL DATA:  Chest pain. EXAM: CHEST  1 VIEW COMPARISON:  March 29, 2013 FINDINGS: Multiple sternal wires and vascular clips are noted. Marked severity calcification of the aortic arch is seen. The heart size and mediastinal contours are within normal limits. Both lungs are clear. The visualized skeletal structures are unremarkable. IMPRESSION: 1. Evidence of prior median sternotomy/CABG. 2. No active cardiopulmonary disease. Electronically Signed   By: Virgle Grime M.D.   On: 02/11/2024 14:26     Assessment and Plan: Coronary artery disease with unstable angina: Reviewed  patient's symptoms.  When she was seen in the outpatient setting she was only complaining of shortness of breath but she subsequently has developed chest pain that is responsive to sublingual nitroglycerin .  Even though her high-sensitivity troponin is negative and EKG shows no acute changes, her symptoms are compelling for either bypass graft disease, progressive native vessel disease, or potentially microvascular dysfunction.  Her symptoms do seem to be responsive to sublingual nitroglycerin .  She has remained on clopidogrel .  Her last cardiac catheterization was about 8 years ago.  I think a definitive evaluation is indicated.  After shared decision making conversation, the patient agrees and strongly favors a definitive evaluation.  She has been accessed from the right femoral artery with history of left femoral endarterectomy and poor radial access.  Her last catheterization showed patency of her LIMA graft, saphenous vein graft to the RCA, and native circumflex stent. I have reviewed the risks, indications, and alternatives to cardiac catheterization, possible angioplasty, and stenting with the patient. Risks include but are not limited to bleeding, infection, vascular injury, stroke, myocardial infection, arrhythmia, kidney injury, radiation-related injury in the case of prolonged fluoroscopy use, emergency cardiac surgery, and death. The patient understands the risks of serious complication is 1-2 in 1000 with diagnostic cardiac cath and 1-2% or less with angioplasty/stenting.  An echocardiogram will also be performed to evaluate her heart murmur and progressive dyspnea. Type 2 diabetes, insulin  requiring: Treat with sliding scale insulin , carb modified diet, continue Jardiance. Right carotid bruit: Patient has a prominent right carotid bruit and known history of carotid arterial disease with previous left carotid endarterectomy.  Will check a carotid ultrasound.  Unless the patient develops resting  chest discomfort, will hold off on IV heparin  at this point.  Add aspirin  81 mg daily and continue clopidogrel  75 mg daily.  Further plans/disposition pending her cardiac catheterization and echo result.  Risk Assessment/Risk Scores:   TIMI Risk Score for Unstable Angina or Non-ST Elevation MI:   The patient's TIMI risk score is 4, which indicates a 20% risk of all cause mortality, new or recurrent myocardial infarction or need for urgent revascularization in the next 14 days.     Code Status: Full Code  Severity of Illness: The appropriate patient status for this patient is INPATIENT. Inpatient status is judged to be reasonable and necessary in order to provide the required intensity of service to ensure the patient's safety. The patient's presenting symptoms, physical exam findings, and initial radiographic and laboratory data in the context of their chronic comorbidities is felt to place them at high risk for further clinical deterioration. Furthermore,  it is not anticipated that the patient will be medically stable for discharge from the hospital within 2 midnights of admission.   * I certify that at the point of admission it is my clinical judgment that the patient will require inpatient hospital care spanning beyond 2 midnights from the point of admission due to high intensity of service, high risk for further deterioration and high frequency of surveillance required.*  For questions or updates, please contact [facility] Please consult [email or web address] for contact info under     Signed, Arnoldo Lapping, MD  02/11/2024 7:04 PM

## 2024-02-11 NOTE — ED Provider Triage Note (Signed)
 Emergency Medicine Provider Triage Evaluation Note  Nitasha MALEAHA HUGHETT , a 73 y.o. female  was evaluated in triage.  Pt complains of chest pain.  Onset was June 1.  Symptoms been persistent since then with waxing, waning severity.  Yesterday she saw her physician and was encouraged to go to the emergency department for evaluation.  Review of Systems  Positive: Chest pain Negative: Fever  Physical Exam  BP (!) 149/68 (BP Location: Left Arm)   Pulse 71   Temp 97.8 F (36.6 C)   Resp 16   SpO2 100%  Gen:   Awake, no distress elderly female awake and alert Resp:  Normal effort no audible wheezing MSK:   Moves extremities without difficulty no deformity Other:  Neuro intact  Medical Decision Making  Medically screening exam initiated at 11:52 AM.  Appropriate orders placed.  Chetara P Pepperman was informed that the remainder of the evaluation will be completed by another provider, this initial triage assessment does not replace that evaluation, and the importance of remaining in the ED until their evaluation is complete.   Dorenda Gandy, MD 02/11/24 1153

## 2024-02-11 NOTE — ED Provider Notes (Signed)
 Cardiology evaluated the patient and for concern for unstable angina.  They will admit for further care.  Hemodynamically stable throughout my care.   Lowery Rue, DO 02/11/24 [id number]

## 2024-02-11 NOTE — ED Notes (Signed)
 Pt called once with no response

## 2024-02-11 NOTE — ED Notes (Addendum)
 CCMD called and pt placed on the monitor.

## 2024-02-11 NOTE — ED Provider Notes (Addendum)
 [facility] EMERGENCY DEPARTMENT AT [facility] Provider Note   CSN: [id number] Arrival date & time: 02/11/24  1051     Patient presents with: Chest Pain and Shortness of Breath   Darlene Berry is a 73 y.o. female.   HPI Patient presents with chest pain x 2 weeks.  I saw her family medicine physician yesterday, was encouraged to come to the emergency department for patient.  She is here with her daughter who assists with the history.  Pain is sternal, nonradiating.  She has a history of CABG, is a smoker.    Prior to Admission medications   Medication Sig Start Date End Date Taking? Authorizing Provider  ALPRAZolam  (XANAX ) 0.5 MG tablet Take 0.5 mg by mouth at bedtime.   Yes [provider]  amLODipine  (NORVASC ) 10 MG tablet TAKE 1 TABLET BY MOUTH DAILY 01/26/24  Yes Arnoldo Lapping, MD  Ascorbic Acid (VITAMIN C) 1000 MG tablet Take 1,000 mg by mouth daily.   Yes [provider]  clopidogrel  (PLAVIX ) 75 MG tablet TAKE 1 TABLET BY MOUTH AT  BEDTIME Patient taking differently: Take 75 mg by mouth daily at 6 PM. 06/11/23  Yes Arnoldo Lapping, MD  ferrous sulfate  325 (65 FE) MG tablet Take 325 mg by mouth daily with breakfast.   Yes [provider]  Magnesium  400 MG CAPS Take 400 mg by mouth at bedtime.   Yes [provider]  magnesium  oxide (MAG-OX) 400 (240 Mg) MG tablet Take 400 mg by mouth at bedtime.   Yes [provider]  nitroGLYCERIN  (NITROSTAT ) 0.4 MG SL tablet DISSOLVE 1 TABLET UNDER THE  TONGUE EVERY 5 MINUTES AS NEEDED FOR CHEST PAIN. MAX OF 3 TABLETS IN 15 MINUTES. CALL 911 IF PAIN  PERSISTS. Patient taking differently: Place 0.4 mg under the tongue every 5 (five) minutes x 3 doses as needed (CALL 9-1-1, IF NO RELIEF). 02/02/24  Yes Carie Charity, NP  rosuvastatin  (CRESTOR ) 40 MG tablet Take 40 mg by mouth at bedtime. 04/10/22  Yes [provider]  TYLENOL  8 HOUR ARTHRITIS PAIN 650 MG CR tablet Take 1,300 mg by  mouth at bedtime as needed for pain.   Yes [provider]  Continuous Glucose Receiver (FREESTYLE LIBRE 2 READER) DEVI as directed invitro checks blood sugars 4 times a day/Takes insulin  4 times a day 06/03/22   [provider]  Continuous Glucose Sensor (FREESTYLE LIBRE 2 SENSOR) MISC Inject 1 Device into the skin every 14 (fourteen) days.    [provider]  DULoxetine (CYMBALTA) 30 MG capsule Take 30 mg by mouth daily.  07/26/14   [provider]  fenofibrate  160 MG tablet Take 160 mg by mouth at bedtime.     [provider]  HUMALOG KWIKPEN 100 UNIT/ML KwikPen PLEASE SEE ATTACHED FOR DETAILED DIRECTIONS 12/24/23   [provider]  insulin  glargine (LANTUS ) 100 UNIT/ML injection Inject 50 Units into the skin as needed.    [provider]  isosorbide  mononitrate (IMDUR ) 30 MG 24 hr tablet TAKE 1 TABLET BY MOUTH DAILY 01/26/24   Cooper, Michael, MD  JARDIANCE 10 MG TABS tablet Take 10 mg by mouth daily. 01/28/24   [provider]  lisinopril  (ZESTRIL ) 20 MG tablet TAKE 1 TABLET BY MOUTH TWICE  DAILY 06/11/23   Arnoldo Lapping, MD  metformin  (FORTAMET ) 1000 MG (OSM) 24 hr tablet Take 1,000 mg by mouth 2 (two) times daily.    [provider]  metoprolol  tartrate (LOPRESSOR )  50 MG tablet TAKE 1 AND 1/2 TABLETS BY MOUTH  TWICE DAILY 06/11/23   Arnoldo Lapping, MD  nitrofurantoin  (MACRODANTIN ) 100 MG capsule Take 100 mg by mouth at bedtime. With food or milk    [provider]  ONE TOUCH ULTRA TEST test strip 1 each by Other route daily.  03/15/15   [provider]  OZEMPIC, 0.25 OR 0.5 MG/DOSE, 2 MG/1.5ML SOPN Inject into the skin once a week. 10/09/20   [provider]  pantoprazole  (PROTONIX ) 40 MG tablet Take 40 mg by mouth daily as needed (INDIGESTION).    [provider]  potassium gluconate 595 (99 K) MG TABS tablet Take 595 mg by mouth daily.    [provider]    Allergies:  Cefuroxime     Review of Systems  Updated Vital Signs BP (!) 180/93   Pulse 86   Temp 97.8 F (36.6 C)   Resp 18   SpO2 100%   Physical Exam Vitals and nursing note reviewed.  Constitutional:      General: She is not in acute distress.    Appearance: She is well-developed.  HENT:     Head: Normocephalic and atraumatic.   Eyes:     Conjunctiva/sclera: Conjunctivae normal.    Cardiovascular:     Rate and Rhythm: Normal rate and regular rhythm.  Pulmonary:     Effort: Pulmonary effort is normal. No respiratory distress.     Breath sounds: Normal breath sounds. No stridor.  Abdominal:     General: There is no distension.   Skin:    General: Skin is warm and dry.   Neurological:     Mental Status: She is alert and oriented to person, place, and time.     Cranial Nerves: No cranial nerve deficit.   Psychiatric:        Mood and Affect: Mood normal.     (all labs ordered are listed, but only abnormal results are displayed) Labs Reviewed  BASIC METABOLIC PANEL WITH GFR - Abnormal; Notable for the following components:      Result Value   Glucose, Bld 203 (*)    Creatinine, Ser 1.23 (*)    Calcium  10.4 (*)    GFR, Estimated 46 (*)    All other components within normal limits  CBG MONITORING, ED - Abnormal; Notable for the following components:   Glucose-Capillary 215 (*)    All other components within normal limits  CBC  TROPONIN I (HIGH SENSITIVITY)  TROPONIN I (HIGH SENSITIVITY)    EKG: None  Radiology: DG Chest 1 View Result Date: 02/11/2024 CLINICAL DATA:  Chest pain. EXAM: CHEST  1 VIEW COMPARISON:  March 29, 2013 FINDINGS: Multiple sternal wires and vascular clips are noted. Marked severity calcification of the aortic arch is seen. The heart size and mediastinal contours are within normal limits. Both lungs are clear. The visualized skeletal structures are unremarkable. IMPRESSION: 1. Evidence of prior median sternotomy/CABG. 2. No active cardiopulmonary  disease. Electronically Signed   By: Virgle Grime M.D.   On: 02/11/2024 14:26     Procedures   Medications Ordered in the ED  aspirin  chewable tablet 324 mg (324 mg Oral Given 02/11/24 1202)                                    Medical Decision Making Tree of substantial heart disease, high risk profile for ischemia presents with new  chest pain, waxing, waning concern for unstable angina.  On reviewing her chart, from prior CABG there is some residual disease and the patient is a smoker. Patient's initial studies reassuring, troponin 8.  No evidence for pneumonia.  Amount and/or Complexity of Data Reviewed Independent Historian: EMS External Data Reviewed: notes. Labs: ordered. Decision-making details documented in ED Course. Radiology: ordered and independent interpretation performed. Decision-making details documented in ED Course. ECG/medicine tests: ordered and independent interpretation performed. Decision-making details documented in ED Course.  Risk OTC drugs. Prescription drug management. Decision regarding hospitalization. Diagnosis or treatment significantly limited by social determinants of health.   On repeat exam patient in similar condition, initial troponin value of normal.  We discussed her findings, given her presentation with new atypical chest pain consult for cardiology has been placed, disposition pending that consultation.     Final diagnoses:  Atypical chest pain     Dorenda Gandy, MD 02/11/24 1610    Dorenda Gandy, MD 02/11/24 [phone]

## 2024-02-11 NOTE — ED Triage Notes (Signed)
 Pt. Stated, Ive had SOB and chest pain since June 1st and Ive seen my Dr. , [name]  [name] to come back here cause you probably have a blockage and need a cardiac catherization.Aaron Aas daughter stated, she has become worse.

## 2024-02-12 ENCOUNTER — Encounter (HOSPITAL_COMMUNITY)
Admission: EM | Disposition: A | Discharge status: Home / Self Care | Payer: Self-pay | Source: Home / Self Care | Attending: Emergency Medicine

## 2024-02-12 ENCOUNTER — Observation Stay (HOSPITAL_BASED_OUTPATIENT_CLINIC_OR_DEPARTMENT_OTHER)

## 2024-02-12 ENCOUNTER — Observation Stay (HOSPITAL_COMMUNITY)

## 2024-02-12 ENCOUNTER — Encounter (HOSPITAL_COMMUNITY): Payer: Self-pay | Admitting: Cardiology

## 2024-02-12 DIAGNOSIS — I25110 Atherosclerotic heart disease of native coronary artery with unstable angina pectoris: Secondary | ICD-10-CM | POA: Diagnosis not present

## 2024-02-12 DIAGNOSIS — R0989 Other specified symptoms and signs involving the circulatory and respiratory systems: Secondary | ICD-10-CM | POA: Diagnosis not present

## 2024-02-12 DIAGNOSIS — R072 Precordial pain: Secondary | ICD-10-CM

## 2024-02-12 HISTORY — PX: LEFT HEART CATH AND CORS/GRAFTS ANGIOGRAPHY: CATH118250

## 2024-02-12 LAB — CBC
HCT: 39.1 % (ref 36.0–46.0)
HCT: 38.7 % (ref 36.0–46.0)
Hemoglobin: 13.3 g/dL (ref 12.0–15.0)
Hemoglobin: 13.6 g/dL (ref 12.0–15.0)
MCH: 29.6 pg (ref 26.0–34.0)
MCH: 29.8 pg (ref 26.0–34.0)
MCHC: 34 g/dL (ref 30.0–36.0)
MCHC: 35.1 g/dL (ref 30.0–36.0)
MCV: 87.1 fL (ref 80.0–100.0)
MCV: 84.9 fL (ref 80.0–100.0)
Platelets: 209 10*3/uL (ref 150–400)
Platelets: 206 10*3/uL (ref 150–400)
RBC: 4.49 MIL/uL (ref 3.87–5.11)
RBC: 4.56 MIL/uL (ref 3.87–5.11)
RDW: 12.1 % (ref 11.5–15.5)
RDW: 12 % (ref 11.5–15.5)
WBC: 7.4 10*3/uL (ref 4.0–10.5)
WBC: 7.3 10*3/uL (ref 4.0–10.5)
nRBC: 0.3 % — ABNORMAL HIGH (ref 0.0–0.2)
nRBC: 0 % (ref 0.0–0.2)

## 2024-02-12 LAB — CREATININE, SERUM
Creatinine, Ser: 1.16 mg/dL — ABNORMAL HIGH (ref 0.44–1.00)
GFR, Estimated: 50 mL/min — ABNORMAL LOW (ref >60)

## 2024-02-12 LAB — PROTIME-INR
INR: 1.1 (ref 0.8–1.2)
Prothrombin Time: 14.4 s (ref 11.4–15.2)

## 2024-02-12 LAB — BASIC METABOLIC PANEL WITH GFR
Anion gap: 13 (ref 5–15)
BUN: 27 mg/dL — ABNORMAL HIGH (ref 8–23)
CO2: 19 mmol/L — ABNORMAL LOW (ref 22–32)
Calcium: 9.2 mg/dL (ref 8.9–10.3)
Chloride: 105 mmol/L (ref 98–111)
Creatinine, Ser: 1.15 mg/dL — ABNORMAL HIGH (ref 0.44–1.00)
GFR, Estimated: 50 mL/min — ABNORMAL LOW (ref >60)
Glucose, Bld: 186 mg/dL — ABNORMAL HIGH (ref 70–99)
Potassium: 4.3 mmol/L (ref 3.5–5.1)
Sodium: 137 mmol/L (ref 135–145)

## 2024-02-12 LAB — LIPID PANEL
Cholesterol: 99 mg/dL (ref 0–200)
HDL: 30 mg/dL — ABNORMAL LOW (ref >40)
LDL Cholesterol: UNDETERMINED mg/dL (ref 0–99)
Total CHOL/HDL Ratio: 3.3 ratio
Triglycerides: 408 mg/dL — ABNORMAL HIGH (ref <150)
VLDL: UNDETERMINED mg/dL (ref 0–40)

## 2024-02-12 LAB — TROPONIN I (HIGH SENSITIVITY)
Troponin I (High Sensitivity): 17 ng/L (ref <18)
Troponin I (High Sensitivity): 33 ng/L — ABNORMAL HIGH
Troponin I (High Sensitivity): 38 ng/L — ABNORMAL HIGH (ref <18)
Troponin I (High Sensitivity): 55 ng/L — ABNORMAL HIGH (ref <18)

## 2024-02-12 LAB — ECHOCARDIOGRAM COMPLETE
Est EF: >75
S' Lateral: 2.1 cm

## 2024-02-12 LAB — GLUCOSE, CAPILLARY: Glucose-Capillary: 233 mg/dL — ABNORMAL HIGH (ref 70–99)

## 2024-02-12 LAB — CBG MONITORING, ED
Glucose-Capillary: 205 mg/dL — ABNORMAL HIGH (ref 70–99)
Glucose-Capillary: 193 mg/dL — ABNORMAL HIGH (ref 70–99)

## 2024-02-12 LAB — LDL CHOLESTEROL, DIRECT: Direct LDL: 43 mg/dL (ref 0–99)

## 2024-02-12 SURGERY — LEFT HEART CATH AND CORS/GRAFTS ANGIOGRAPHY
Anesthesia: LOCAL

## 2024-02-12 MED ORDER — LIDOCAINE HCL (PF) 1 % IJ SOLN
INTRAMUSCULAR | Status: DC | PRN
  Administered 2024-02-12: 5 mL via INTRADERMAL

## 2024-02-12 MED ORDER — NITROGLYCERIN IN D5W 200-5 MCG/ML-% IV SOLN
0.0000 ug/min | INTRAVENOUS | Status: DC
  Administered 2024-02-12: 5 ug/min via INTRAVENOUS
  Filled 2024-02-12: qty 250

## 2024-02-12 MED ORDER — SODIUM CHLORIDE 0.9 % IV SOLN: 250.0000 mL | INTRAVENOUS | Status: DC | PRN

## 2024-02-12 MED ORDER — ISOSORBIDE MONONITRATE ER 30 MG PO TB24: 30.0000 mg | ORAL_TABLET | Freq: Every day | ORAL | Status: DC

## 2024-02-12 MED ORDER — MIDAZOLAM HCL 2 MG/2ML IJ SOLN
INTRAMUSCULAR | Status: DC | PRN
  Administered 2024-02-12: 1 mg via INTRAVENOUS

## 2024-02-12 MED ORDER — SODIUM CHLORIDE 0.9% FLUSH: 3.0000 mL | INTRAVENOUS | Status: DC | PRN

## 2024-02-12 MED ORDER — ONDANSETRON HCL 4 MG/2ML IJ SOLN
4.0000 mg | Freq: Four times a day (QID) | INTRAMUSCULAR | Status: DC | PRN
  Administered 2024-02-12: 4 mg via INTRAVENOUS
  Filled 2024-02-12 (×2): qty 2

## 2024-02-12 MED ORDER — SODIUM CHLORIDE 0.9% FLUSH
3.0000 mL | Freq: Two times a day (BID) | INTRAVENOUS | Status: DC
  Administered 2024-02-12 – 2024-02-13 (×2): 3 mL via INTRAVENOUS

## 2024-02-12 MED ORDER — ALPRAZOLAM 0.5 MG PO TABS
0.5000 mg | ORAL_TABLET | Freq: Three times a day (TID) | ORAL | Status: DC | PRN
  Administered 2024-02-12: 0.5 mg via ORAL
  Filled 2024-02-12: qty 1

## 2024-02-12 MED ORDER — LISINOPRIL 20 MG PO TABS: 20.0000 mg | ORAL_TABLET | Freq: Every day | ORAL | Status: DC

## 2024-02-12 MED ORDER — LIDOCAINE HCL (PF) 1 % IJ SOLN
INTRAMUSCULAR | Status: AC
  Filled 2024-02-12: qty 30

## 2024-02-12 MED ORDER — HEPARIN SODIUM (PORCINE) 5000 UNIT/ML IJ SOLN
5000.0000 [IU] | Freq: Three times a day (TID) | INTRAMUSCULAR | Status: DC
  Administered 2024-02-12 – 2024-02-13 (×2): 5000 [IU] via SUBCUTANEOUS
  Filled 2024-02-12 (×2): qty 1

## 2024-02-12 MED ORDER — IOHEXOL 350 MG/ML SOLN
INTRAVENOUS | Status: DC | PRN
  Administered 2024-02-12: 25 mL

## 2024-02-12 MED ORDER — SODIUM CHLORIDE 0.9 % IV SOLN: INTRAVENOUS | Status: AC

## 2024-02-12 MED ORDER — ONDANSETRON HCL 4 MG/2ML IJ SOLN
4.0000 mg | Freq: Once | INTRAMUSCULAR | Status: AC
  Administered 2024-02-12: 4 mg via INTRAVENOUS
  Filled 2024-02-12: qty 2

## 2024-02-12 MED ORDER — MIDAZOLAM HCL 2 MG/2ML IJ SOLN
INTRAMUSCULAR | Status: AC
  Filled 2024-02-12: qty 2

## 2024-02-12 MED ORDER — CARVEDILOL 6.25 MG PO TABS
6.2500 mg | ORAL_TABLET | Freq: Two times a day (BID) | ORAL | Status: DC
  Administered 2024-02-12: 6.25 mg via ORAL
  Filled 2024-02-12: qty 1

## 2024-02-12 MED ORDER — HYDRALAZINE HCL 20 MG/ML IJ SOLN
10.0000 mg | INTRAMUSCULAR | Status: AC | PRN
  Administered 2024-02-12: 10 mg via INTRAVENOUS
  Filled 2024-02-12 (×2): qty 1

## 2024-02-12 MED ORDER — FENTANYL CITRATE (PF) 100 MCG/2ML IJ SOLN
INTRAMUSCULAR | Status: DC | PRN
Start: 2024-02-12 — End: 2024-02-12
  Administered 2024-02-12: 50 ug via INTRAVENOUS

## 2024-02-12 MED ORDER — LISINOPRIL 20 MG PO TABS
20.0000 mg | ORAL_TABLET | Freq: Once | ORAL | Status: AC
  Administered 2024-02-12: 20 mg via ORAL
  Filled 2024-02-12: qty 1

## 2024-02-12 MED ORDER — HYDRALAZINE HCL 20 MG/ML IJ SOLN
10.0000 mg | INTRAMUSCULAR | Status: DC | PRN
  Administered 2024-02-12: 10 mg via INTRAVENOUS

## 2024-02-12 MED ORDER — FENTANYL CITRATE (PF) 100 MCG/2ML IJ SOLN
INTRAMUSCULAR | Status: AC
Start: 2024-02-12 — End: 2024-02-12
  Filled 2024-02-12: qty 2

## 2024-02-12 MED ORDER — HEPARIN (PORCINE) IN NACL 1000-0.9 UT/500ML-% IV SOLN
INTRAVENOUS | Status: DC | PRN
  Administered 2024-02-12 (×2): 500 mL

## 2024-02-12 MED ORDER — HYDRALAZINE HCL 20 MG/ML IJ SOLN
INTRAMUSCULAR | Status: AC
  Filled 2024-02-12: qty 1

## 2024-02-12 MED ORDER — AMLODIPINE BESYLATE 10 MG PO TABS: 10.0000 mg | ORAL_TABLET | Freq: Every day | ORAL | Status: DC

## 2024-02-12 SURGICAL SUPPLY — 12 items
CATH INFINITI 5 FR AR1 MOD (CATHETERS) IMPLANT
CATH INFINITI 5 FR IM (CATHETERS) IMPLANT
CATH INFINITI 5FR MULTPACK ANG (CATHETERS) IMPLANT
CLOSURE MYNX CONTROL 5F (Vascular Products) IMPLANT
KIT MICROPUNCTURE NIT STIFF (SHEATH) IMPLANT
MAT PREVALON FULL STRYKER (MISCELLANEOUS) IMPLANT
PACK CARDIAC CATHETERIZATION (CUSTOM PROCEDURE TRAY) ×1 IMPLANT
SET ATX-X65L (MISCELLANEOUS) IMPLANT
SHEATH PINNACLE 5F 10CM (SHEATH) IMPLANT
SHEATH PROBE COVER 6X72 (BAG) IMPLANT
WIRE EMERALD 3MM-J .035X150CM (WIRE) IMPLANT
WIRE EMERALD 3MM-J .035X260CM (WIRE) IMPLANT

## 2024-02-12 NOTE — Progress Notes (Signed)
  Echocardiogram 2D Echocardiogram has been performed.  Dione Franks 02/12/2024, 5:57 PM

## 2024-02-12 NOTE — Care Management Obs Status (Signed)
 MEDICARE OBSERVATION STATUS NOTIFICATION   Patient Details  Name: SUESAN MOHRMANN MRN: [id number] Date of Birth: November 09, 1950   Medicare Observation Status Notification Given:  Yes    Jennett Model, RN 02/12/2024, 4:46 PM

## 2024-02-12 NOTE — Interval H&P Note (Signed)
 History and Physical Interval Note:  02/12/2024 2:39 PM  Darlene Berry  has presented today for surgery, with the diagnosis of chest pain.  The various methods of treatment have been discussed with the patient and family. After consideration of risks, benefits and other options for treatment, the patient has consented to  Procedure(s): LEFT HEART CATH AND CORS/GRAFTS ANGIOGRAPHY (N/A) as a surgical intervention.  The patient's history has been reviewed, patient examined, no change in status, stable for surgery.  I have reviewed the patient's chart and labs.  Questions were answered to the patient's satisfaction.     Darlene Berry

## 2024-02-12 NOTE — Progress Notes (Signed)
  Progress Note  Patient Name: Darlene Berry Date of Encounter: 02/12/2024 [facility] Cardiologist: Arnoldo Lapping, MD   Interval Summary   No chest pain or shortness of breath overnight.  Vital Signs Vitals:   02/12/24 0200 02/12/24 0343 02/12/24 0345 02/12/24 0400  BP: (!) 146/53  (!) 124/53 (!) 133/49  Pulse: 91  92 94  Resp: (!) 21  20 20   Temp:  98.2 F (36.8 C)    TempSrc:  Oral    SpO2: 97%  97% 97%    Intake/Output Summary (Last 24 hours) at 02/12/2024 0650 Last data filed at 02/11/2024 2002 Gross per 24 hour  Intake --  Output 800 ml  Net -800 ml      02/02/2024    9:37 AM 06/02/2023    2:46 PM 05/28/2022   10:13 AM  Last 3 Weights  Weight (lbs) 141 lb 6.4 oz 121 lb 6.4 oz 125 lb  Weight (kg) 64.139 kg 55.067 kg 56.7 kg      Telemetry/ECG  Sinus rhythm- Personally Reviewed   Assessment & Plan   Coronary artery disease Unstable angina Patient s/p CABG in 2014 admitted by cardiology after presenting to the ED with 2 weeks of progressive chest discomfort and shortness of breath. Associated weakness and fatigue also noted. Pain has been responsive to NTG. After being seen in office on 6/9, was set up for outpatient PET stress testing, however, presented to the ED with worsening symptoms. EKG this admission without acute ischemic changes. Troponin is not elevated. Last LHC in 2017 noted patency of her LIMA graft, saphenous vein graft to the RCA, and native circumflex stent. LHC today Echocardiogram pending Continue ASA 81mg  with Plavix  75mg  Continue Imdur  30mg  daily Continue Crestor  40mg  and Fenofibrate  160mg  Patient reportedly not taking her Lopressor . Attributes some of her above symptoms to this.  Hypertension Borderline elevate BP this admission. Continue Lisinopril  20mg  Continue Imdur  30mg  Continue Amlodipine  10mg   DM type II Last A1c 7.4%.  SSI while inpatient Continue Jardiance 10mg          For questions or updates, please  contact [facility] Please consult [email or web address] for contact info under       Signed, Leala Prince, PA-C   Patient seen, examined. Available data reviewed. Agree with findings, assessment, and plan as outlined by Leala Prince, PA-C.  Please see my other note the same date.  Arnoldo Lapping, M.D. 02/12/2024 7:57 AM

## 2024-02-12 NOTE — Progress Notes (Signed)
  Progress Note  Patient Name: Darlene Berry Date of Encounter: 02/12/2024 [facility] Cardiologist: Arnoldo Lapping, MD   Interval Summary   Doing well this morning no chest pain or shortness of breath overnight at rest.  Questions answered this morning regarding cardiac catheterization.  Vital Signs Vitals:   02/12/24 0200 02/12/24 0343 02/12/24 0345 02/12/24 0400  BP: (!) 146/53  (!) 124/53 (!) 133/49  Pulse: 91  92 94  Resp: (!) 21  20 20   Temp:  98.2 F (36.8 C)    TempSrc:  Oral    SpO2: 97%  97% 97%    Intake/Output Summary (Last 24 hours) at 02/12/2024 1610 Last data filed at 02/11/2024 2002 Gross per 24 hour  Intake --  Output 800 ml  Net -800 ml      02/02/2024    9:37 AM 06/02/2023    2:46 PM 05/28/2022   10:13 AM  Last 3 Weights  Weight (lbs) 141 lb 6.4 oz 121 lb 6.4 oz 125 lb  Weight (kg) 64.139 kg 55.067 kg 56.7 kg      Telemetry/ECG  SR without significant arrhythmia - Personally Reviewed  Physical Exam  GEN: No acute distress.   Neck: No JVD Cardiac: RRR, 2/6 SEM at the RUSB Respiratory: Clear to auscultation bilaterally. GI: Soft, nontender, non-distended  MS: No edema  Assessment & Plan  CAD with unstable angina: no further CP overnight. Continue ASA and clopidogrel . See H&P: plans for cath possible PCI today. Pt consented.  Type II DM: A1C 7.4. On SSI.  Dispo: pending cath today. Echo and carotid US  ordered for dyspnea and R carotid bruit, respectively.  If no obstructive CAD, patient will be appropriate for hospital discharge today.  Medical Readiness Date: 02/13/2024    For questions or updates, please contact [facility] Please consult [email or web address] for contact info under       Signed, Arnoldo Lapping, MD

## 2024-02-12 NOTE — Progress Notes (Signed)
 Interval Note   Patient Name: Darlene Berry Date of Encounter: 02/12/2024  [facility] Cardiologist: Arnoldo Lapping, MD   Subjective Patient started having severe chest pain 20 minutes ago. She described it as 10/10 with associated left arm numbness, diaphoresis and nausea. After 2 SL nitroglycerin  and 1 dose of IV hydralazine  she reported improvement in symptoms though still present.   Approximately an hour later, checked on patient after being initiated on IV nitroglycerin . She reported feeling much better. She was sitting up right with dinner tray.   Vital Signs  Vitals:   02/12/24 1716 02/12/24 1725 02/12/24 1815 02/12/24 1830  BP: (!) 170/61 (!) 145/55 (!) 177/61 (!) 187/61  Pulse:      Resp: (!) 22 16 16 16   Temp:      TempSrc:      SpO2:        ECG  Sinus tachycardia (102) with ST depression in  I, avL, V5 and V6- Personally Reviewed Image uploaded to Media tab.    Patient Profile   73 y.o. female CAD s/p CABG in 2014 (LIMA to LAD, SVG to RCA) s/p PCI (DES to mid-circumflex), Carotid artery disease s/p L CEA, PAD s/p L CFA endarterectomy, T2DM, hypertension, and hyperlipidemia who presented on 02/12/2024  to the ED with on progressive chest pain.   Assessment & Plan  Acute Chest pain Hypertensive Emergency Coronary artery disease s/p CABG in 2014 (LIMA to LAD, SVG to RCA) s/p PCI (DES to mid-circumflex) Unstable angina Patient presented on 02/12/2024 with 2 week progressive chest discomfort and shortness of breath with associated fatigue and weakness.In the ED the ECG showed no evidence of ischemia and troponins were negative. She was taken to the cath lab on 02/12/2024 which showed patent grafts, severe native vessel disease. There were no culprit lesions to intervene on. She did experience high blood pressures during procedure. She has been having borderline high blood pressure this admission. She was to be discharged if systolic were <150.   Approximately 2  hours after the procedure, her blood pressure was 184/56, and she started having severe 10/10 left sided chest pain. There was associated diaphoresis, left sided arm numbness, and nausea. Myself, Willis Harter PA-C, and Jacalyn Martin PA-C came to patient bedside. She was given 2 SL nitroglycerins and one dose of IV hydralazine  10 mg. She did have ST depressions in I, avL, V5, and V6 during this time. Spoke to Dr. Filiberto Hug and he suspected her chest pain was due to her blood pressure based on the anatomy seen during the cath and the lack of significant ECG changes. He recommended starting nitroglycerin  gtt, obtaining hstroponins, and echocardiogram. Incidentally echo had arrived for previously order echocardiogram this admission and obtained images during this event.    -continue nitroglycerin  gtt starting at 10 and wean as tolerated for blood pressure and pain relief -if she remains tachycardic and hypertensive will consider adding metoprolol  but would like to titrate nitroglycerin  first  -continue ASA 81 mg daily -continue plavix  75 mg -will hold imdur  30 while patient is on nitroglycerin  gtt -will hold amlodipine  10 mg and lisinopril  20 mg as patient is on nitroglycerin  gtt  -follow up with echocardiogram -trend hs troponin, currently pending  6. Hyperlipidemia -continue crestor  40 mg -continue fenofibrate  160 mg  7. T2DM -continue SSI -continue jardiance 10 mg     For questions or updates, please contact  [facility] Please consult [email or web address] for contact info under     Signed, [employer]  Catheline Clos, PA-C  02/12/2024, 7:03 PM

## 2024-02-12 NOTE — Progress Notes (Signed)
 VASCULAR LAB    Vascular lab completed the right side of the carotid duplex.  Cath lab came to get patient before left carotid could be scanned.Will attempt to complete study post cath.    Elektra Wartman, RVT 02/12/2024, 3:17 PM

## 2024-02-12 NOTE — Progress Notes (Signed)
 VASCULAR LAB    Carotid duplex has been performed.  See CV proc for preliminary results.   Rolan Wrightsman, RVT 02/12/2024, 5:02 PM

## 2024-02-12 NOTE — ED Notes (Addendum)
 Patient re-educated that she needs to be NPO at midnight. Patient verbalized understanding.

## 2024-02-12 NOTE — Discharge Summary (Addendum)
 Discharge Summary   Patient ID: Darlene Berry MRN: [id number]; DOB: [birth year]  Admit date: 02/11/2024 Discharge date: 02/13/2024  PCP:  Jarold Lenis, PA-C   [facility] Providers Cardiologist:  Ozell Fell, MD  Cardiology APP:  Lelon Glendia DASEN, PA-C      Discharge Diagnoses  Principal Problem:   Unstable angina [facility]) Active Problems:   Carotid artery disease [facility])   Essential hypertension   Hypertriglyceridemia   Recurrent UTI   Type 2 diabetes mellitus with complication, without long-term current use of insulin  (HCC)   Hypertensive urgency   Diagnostic Studies/Procedures   Coronary and bypass graft angiography 02/12/2024: LM: No significant disease LAD: Prox LAD, diag 1 50-60% disease, followed by mid LAD occlusion, bypassed by patent LIMA-LAD Lcx: Prox-mid diffuse 40% disease, patent mid LAD stent with no restenosis RCA: Prox-mid diffuse 60% calcific disease, followed by mid RCA occlusion, bypassed by patent SVG-PDA LIMA-LAD: Patnent, no significant disease SVG-RPDA: Patent, no significant disease   LVEDP 4 mmHg       Severe native vessel disease Patent 2/2 grafts   No acute culprit lesion identified Continue medical management, especially aggressive blood pressure control   Okay to discharge patient home later this evening after 2 hour bed rest is completed and blood pressure is under control (SBP <150 mmHg).  Antiplatelet/Anticoag Continue Plavix  monotherapy    CHEST  1 VIEW   COMPARISON:  March 29, 2013   FINDINGS: Multiple sternal wires and vascular clips are noted. Marked severity calcification of the aortic arch is seen. The heart size and mediastinal contours are within normal limits. Both lungs are clear. The visualized skeletal structures are unremarkable.   IMPRESSION: 1. Evidence of prior median sternotomy/CABG. 2. No active cardiopulmonary disease.  Echocardiogram 02/13/2024 IMPRESSIONS     1. Mild intracavitary  gradient of . [name] Left ventricular ejection  fraction, by estimation, is >75%. The left ventricle has hyperdynamic  function. The left ventricle has no regional wall motion abnormalities.  There is moderate concentric left  ventricular hypertrophy. Left ventricular diastolic parameters are  consistent with Grade I diastolic dysfunction (impaired relaxation).   2. Right ventricular systolic function is normal. The right ventricular  size is normal.   3. The mitral valve is normal in structure. Trivial mitral valve  regurgitation. No evidence of mitral stenosis.   4. The aortic valve is normal in structure. Aortic valve regurgitation is  not visualized. Aortic valve sclerosis/calcification is present, without  any evidence of aortic stenosis.   5. The inferior vena cava is normal in size with greater than 50%  respiratory variability, suggesting right atrial pressure of 3 mmHg.    Vas US  Carotid Duplex Bilateral 02/12/2024 Summary:  Right Carotid: Velocities in the right ICA are consistent with a 1-39%  stenosis.   Left Carotid: Velocities in the left ICA are consistent with a 1-39%  stenosis.   Subclavians: Normal flow hemodynamics were seen in bilateral subclavian               arteries.  _____________   History of Present Illness   Darlene Berry is a 73 y.o. female with a past medical history of CAD s/p CABG in 2014 (LIMA to LAD, SVG to RCA) s/p PCI (DES to mid-circumflex), Carotid artery disease s/p L CEA, PAD s/p L CFA endarterectomy, T2DM, hypertension, and hyperlipidemia who presented to the ED on 02/12/2024 with chest pain.   Hospital Course   1.CAD s/p CABG 2014 (LIMA to LAD, SVG to  RCA) s/p PCI (DES to mid-circumflex) 2.Unstable Angina Patient reported 2 week progressive anginal symptoms with associated fatigue and weakness. Initially she was experiencing shortness of breath at which time she followed up with outpatient cardiology and imaging was ordered for further  evaluation. She then noticed worsening chest pain responsive to SL nitroglycerin , and she presented to her PCP on 02/11/2024 who recommended she go to the ED. In the ED, her ECG showed no evidence of ischemia and troponins were negative. She was taken to the cath lab on 02/12/2024 which showed patent grafts, severe native vessel disease. There were no culprit lesions to intervene on. She was to discharge on 02/12/2024, however she developed severe chest pain and high blood pressure overnight. (See below).     -continue plavix  75 mg -continue imdur  30 mg -Continue sublingual nitroglycerin  0.04 mg as needed with max of 3 doses -continue coreg  12.5 mg BID   She did have some urinary rentention overnight after her cath procedure that required an In-N-Out cath, this had resolved by discharge. Most likely in relation to sedation used during procedure.    3.Hypertensive Urgency  During this admission she did have borderline high blood pressure, and during the cath procedure she did experience high blood pressures.   At 1642 on 02/12/2024 patient's blood pressure was 184/56 and she started having severe (10/10) left sided chest pain with associated left arm numbness, diaphoresis, and nausea. While her ECG showed mild ST depressions in the lateral leads, it was not significantly different from her prior ECG that morning. She received 2 SL nitroglycerin  and one IV hydralazine  10 mg which relieved the pain mildly. She was placed on a nitroglycerin  gtt which improved her chest pain. Later that evening, she noted return of her chest pain (8/10) when she walked to the restroom. She had an episode of emesis, was given zofran  and two more SL nitroglycerin  tabs. Her nitroglycerin  gtt was further up titrated with improvement in symptoms. Highest blood pressure 202/58.Her troponins were only mildly elevated (17-> 33-> 38-> 55).  Echocardiogram was obtained during the first episode of chest pain. This showed EF > 75% and no  regional wall motion abnormalities.  Highly suspect chest pain was due to hypertension in the setting of a patient with known severe coronary artery disease. By discharge she was still having mild chest pain with blood pressure fluctuations, but she was started on coreg  in addition to her home hypertensive regimen that provided adequate blood pressure control.   Blood pressure at discharge 141/58.   -continue coreg  12.5 mg BID -continue lisinopril  20 mg daily, of note on admission patient reported taking lisinopril  twice a day at home, she was discharged on once daily.  -Continue amlodipine  10 mg -continue imdur  30 mg -continue SL nitroglycerin  for chest pain as needed with max of 3 doses   4.Hyperlipidemia LDL cannot be calculated, Direct LDL 43  HDL 30   TG 408 -continue crestor  40 mg -continue fenofibrate  160 mg -continue two vascepa  1g capsule BID, the medication was started during this admission given hypertriglyceridemia    5.T2DM A1c 7.4% She has been on chronic Macrobid  for several years due to recurrent UTIs.  She was recently placed on Jardiance  approximately a month ago by her PCP.  She denied UTI since Jardiance  initiation.  -Continue Jardiance  10 mg -Continue chronic Macrobid  -Restart home insulin  regimen and ozempic at discharge  -Continue to hold metformin , will need to hold at discharge for 72 hours due to iodinated contrast  6. Anxiety Continue Cymbalta  30 mg Continue 0.5 mg Xanax  as needed  7. Carotid Artery Disease s/p L CEA in 2012 On admission, right carotid bruit noticed. Carotid duplex ultrasound obtained and showed <40% stenosis bilaterally. Continue to monitor outpatient.     Did the patient have an acute coronary syndrome (MI, NSTEMI, STEMI, etc) this admission?:  Yes                               AHA/ACC ACS Clinical Performance & Quality Measures: Aspirin  prescribed? - No - per cath report to continue monotherapy of plavix  ADP Receptor Inhibitor  (Plavix /Clopidogrel , Brilinta/Ticagrelor or Effient/Prasugrel) prescribed (includes medically managed patients)? - Yes Beta Blocker prescribed? - Yes Coreg  High Intensity Statin (Lipitor 40-80mg  or Crestor  20-40mg ) prescribed? - Yes EF assessed during THIS hospitalization? - Yes For EF <40%, was ACEI/ARB prescribed? - Not Applicable (EF >/= 40%) Patient already on ACEi For EF <40%, Aldosterone Antagonist (Spironolactone or Eplerenone) prescribed? - Not Applicable (EF >/= 40%) Cardiac Rehab Phase II ordered (including medically managed patients)? - Yes   Patient examined by myself and by Dr. Wonda. They were deemed ready for discharge from a cardiac perspective. Medication education provided by pharmacy. Follow up appointment is arranged.    The patient will be scheduled for a TOC follow up appointment in 10-14 days.  A message has been sent to the [facility] and Scheduling Pool at the office where the patient should be seen for follow up.  _____________  Discharge Vitals Blood pressure (!) 141/58, pulse 96, temperature 98.2 F (36.8 C), temperature source Oral, resp. rate 17, weight 61.1 kg, SpO2 100%.  Filed Weights   02/13/24 0500  Weight: 61.1 kg    Labs & Radiologic Studies  CBC Recent Labs    02/12/24 0421 02/12/24 1609  WBC 7.4 7.3  HGB 13.3 13.6  HCT 39.1 38.7  MCV 87.1 84.9  PLT 209 206   Basic Metabolic Panel Recent Labs    93/81/74 1149 02/11/24 2053 02/12/24 0421 02/12/24 1609  NA 137  --  137  --   K 4.8  --  4.3  --   CL 102  --  105  --   CO2 22  --  19*  --   GLUCOSE 203*  --  186*  --   BUN 23  --  27*  --   CREATININE 1.23*   < > 1.15* 1.16*  CALCIUM  10.4*  --  9.2  --    < > = values in this interval not displayed.   Liver Function Tests No results for input(s): AST, ALT, ALKPHOS, BILITOT, PROT, ALBUMIN  in the last 72 hours. No results for input(s): LIPASE, AMYLASE in the last 72 hours. High Sensitivity Troponin:   Recent Labs   Lab 02/11/24 1349 02/12/24 1807 02/12/24 1936 02/12/24 2046 02/12/24 2210  TROPONINIHS 8 17 33* 38* 55*    No results for input(s): TRNPT in the last 720 hours.  BNP Invalid input(s): POCBNP No results for input(s): PROBNP in the last 72 hours.  No results for input(s): BNP in the last 72 hours.  D-Dimer No results for input(s): DDIMER in the last 72 hours. Hemoglobin A1C Recent Labs    02/11/24 2053  HGBA1C 7.4*   Fasting Lipid Panel Recent Labs    02/12/24 0421  CHOL 99  HDL 30*  LDLCALC UNABLE TO CALCULATE IF TRIGLYCERIDE OVER 400 mg/dL  TRIG 591*  CHOLHDL 3.3  LDLDIRECT 43   No results found for: LIPOA  Thyroid Function Tests No results for input(s): TSH, T4TOTAL, T3FREE, THYROIDAB in the last 72 hours.  Invalid input(s): FREET3 _____________  ECHOCARDIOGRAM COMPLETE Result Date: 02/12/2024    ECHOCARDIOGRAM REPORT   Patient Name:   Darlene Berry Date of Exam: 02/12/2024 Medical Rec #:  [id number]       Height:       62.0 in Accession #:    [id number]      Weight:       141.4 lb Date of Birth:  07/19/1951       BSA:          1.650 m Patient Age:    72 years        BP:           184/56 mmHg Patient Gender: F               HR:           106 bpm. Exam Location:  Inpatient Procedure: 2D Echo (Both Spectral and Color Flow Doppler were utilized during            procedure). Indications:    chest pain  History:        Patient has prior history of Echocardiogram examinations, most                 recent 02/19/2013. CAD, Prior CABG; Risk Factors:Diabetes,                 Hypertension and Dyslipidemia.  Sonographer:    Tinnie Barefoot RDCS Referring Phys: [id number] XIKA ZHAO IMPRESSIONS  1. Mild intracavitary gradient of . [name] Left ventricular ejection fraction, by estimation, is >75%. The left ventricle has hyperdynamic function. The left ventricle has no regional wall motion abnormalities. There is moderate concentric left ventricular hypertrophy. Left  ventricular diastolic parameters are consistent with Grade I diastolic dysfunction (impaired relaxation).  2. Right ventricular systolic function is normal. The right ventricular size is normal.  3. The mitral valve is normal in structure. Trivial mitral valve regurgitation. No evidence of mitral stenosis.  4. The aortic valve is normal in structure. Aortic valve regurgitation is not visualized. Aortic valve sclerosis/calcification is present, without any evidence of aortic stenosis.  5. The inferior vena cava is normal in size with greater than 50% respiratory variability, suggesting right atrial pressure of 3 mmHg. FINDINGS  Left Ventricle: Mild intracavitary gradient of . Left ventricular ejection fraction, by estimation, is >75%. The left ventricle has hyperdynamic function. The left ventricle has no regional wall motion abnormalities. The left ventricular internal cavity size was small. There is moderate concentric left ventricular hypertrophy. Left ventricular diastolic parameters are consistent with Grade I diastolic dysfunction (impaired relaxation). Right Ventricle: The right ventricular size is normal. No increase in right ventricular wall thickness. Right ventricular systolic function is normal. Left Atrium: Left atrial size was normal in size. Right Atrium: Right atrial size was normal in size. Pericardium: There is no evidence of pericardial effusion. Mitral Valve: The mitral valve is normal in structure. Mild mitral annular calcification. Trivial mitral valve regurgitation. No evidence of mitral valve stenosis. Tricuspid Valve: The tricuspid valve is normal in structure. Tricuspid valve regurgitation is not demonstrated. No evidence of tricuspid stenosis. Aortic Valve: The aortic valve is normal in structure. Aortic valve regurgitation is not visualized. Aortic valve sclerosis/calcification is present, without any evidence of aortic stenosis. Pulmonic Valve: The pulmonic valve was  normal in  structure. Pulmonic valve regurgitation is not visualized. No evidence of pulmonic stenosis. Aorta: The aortic root is normal in size and structure. Venous: The inferior vena cava is normal in size with greater than 50% respiratory variability, suggesting right atrial pressure of 3 mmHg. IAS/Shunts: No atrial level shunt detected by color flow Doppler.  LEFT VENTRICLE PLAX 2D LVIDd:         2.90 cm   Diastology LVIDs:         2.10 cm   LV e' medial:  4.57 cm/s LV PW:         0.90 cm   LV e' lateral: 8.92 cm/s LV IVS:        1.20 cm LVOT diam:     1.90 cm LV SV:         43 LV SV Index:   26 LVOT Area:     2.84 cm  RIGHT VENTRICLE             IVC RV Basal diam:  2.10 cm     IVC diam: 1.10 cm RV S prime:     12.00 cm/s TAPSE (M-mode): 0.7 cm LEFT ATRIUM             Index        RIGHT ATRIUM          Index LA diam:        3.00 cm 1.82 cm/m   RA Area:     9.37 cm LA Vol (A2C):   36.0 ml 21.82 ml/m  RA Volume:   17.80 ml 10.79 ml/m LA Vol (A4C):   36.5 ml 22.12 ml/m LA Biplane Vol: 36.9 ml 22.37 ml/m  AORTIC VALVE LVOT Vmax:   108.00 cm/s LVOT Vmean:  71.200 cm/s LVOT VTI:    0.150 m  AORTA Ao Root diam: 2.60 cm Ao Asc diam:  2.90 cm  SHUNTS Systemic VTI:  0.15 m Systemic Diam: 1.90 cm Morene Brownie Electronically signed by Morene Brownie Signature Date/Time: 02/12/2024/8:19:44 PM    Final    VAS US  CAROTID Result Date: 02/12/2024 Carotid Arterial Duplex Study Patient Name:  Darlene Berry  Date of Exam:   02/12/2024 Medical Rec #: [id number]        Accession #:    [id number] Date of Birth: 1951-04-18        Patient Gender: F Patient Age:   5 years Exam Location:  [facility] Procedure:      VAS US  CAROTID Referring Phys: XIKA ZHAO --------------------------------------------------------------------------------  Indications:       Right bruit, Carotid artery disease and left endarterectomy. Risk Factors:      Hypertension, hyperlipidemia, Diabetes, past history of                    smoking, coronary  artery disease. Other Factors:     CABG 2014. Limitations        Today's exam was limited due to Shadowing from left CEA. Comparison Study:  Prior carotid duplex done at [facility] 05/07/22 indicating less                    than 50% ICA stenosis, bilaterally. Prior Carotid duplex done                    at [facility] 09/04/2016 indicating bilateral 1-39% ICA stenosis. Performing Technologist: Alberta Lis RVS  Examination Guidelines: A complete evaluation includes B-mode imaging, spectral Doppler, color Doppler, and  power Doppler as needed of all accessible portions of each vessel. Bilateral testing is considered an integral part of a complete examination. Limited examinations for reoccurring indications may be performed as noted.  Right Carotid Findings: +----------+--------+--------+--------+------------------+--------+           PSV cm/sEDV cm/sStenosisPlaque DescriptionComments +----------+--------+--------+--------+------------------+--------+ CCA Prox  92      12                                         +----------+--------+--------+--------+------------------+--------+ CCA Distal85      12              heterogenous               +----------+--------+--------+--------+------------------+--------+ ICA Prox  85      20      1-39%   heterogenous               +----------+--------+--------+--------+------------------+--------+ ICA Mid   108     22                                         +----------+--------+--------+--------+------------------+--------+ ICA Distal95      26                                         +----------+--------+--------+--------+------------------+--------+ ECA       163     7               heterogenous               +----------+--------+--------+--------+------------------+--------+ +----------+--------+-------+--------+-------------------+           PSV cm/sEDV cmsDescribeArm Pressure (mmHG)  +----------+--------+-------+--------+-------------------+ [id number]                                        +----------+--------+-------+--------+-------------------+ +---------+--------+--+--------+--+ VertebralPSV cm/s44EDV cm/s19 +---------+--------+--+--------+--+  Left Carotid Findings: +----------+--------+--------+--------+------------------+------------------+           PSV cm/sEDV cm/sStenosisPlaque DescriptionComments           +----------+--------+--------+--------+------------------+------------------+ CCA Prox  105     18                                intimal thickening +----------+--------+--------+--------+------------------+------------------+ CCA Distal95      16                                intimal thickening +----------+--------+--------+--------+------------------+------------------+ ICA Prox  187     27      1-39%   heterogenous      Shadowing from CEA +----------+--------+--------+--------+------------------+------------------+ ICA Mid   189     36                                                   +----------+--------+--------+--------+------------------+------------------+ ICA Distal156     39                                                   +----------+--------+--------+--------+------------------+------------------+  ECA       121     18                                                   +----------+--------+--------+--------+------------------+------------------+ +----------+--------+--------+--------+-------------------+           PSV cm/sEDV cm/sDescribeArm Pressure (mmHG) +----------+--------+--------+--------+-------------------+ Subclavian180                                         +----------+--------+--------+--------+-------------------+ +---------+--------+--+--------+-+ VertebralPSV cm/s55EDV cm/s7 +---------+--------+--+--------+-+   Summary: Right Carotid: Velocities in the right ICA are  consistent with a 1-39% stenosis. Left Carotid: Velocities in the left ICA are consistent with a 1-39% stenosis. Subclavians: Normal flow hemodynamics were seen in bilateral subclavian              arteries. *See table(s) above for measurements and observations.  Electronically signed by Debby Robertson on 02/12/2024 at 5:48:58 PM.    Final    CARDIAC CATHETERIZATION Result Date: 02/12/2024 Images from the original result were not included. Coronary and bypass graft angiography 02/12/2024: LM: No significant disease LAD: Prox LAD, diag 1 50-60% disease, followed by mid LAD occlusion, bypassed by patent LIMA-LAD Lcx: Prox-mid diffuse 40% disease, patent mid LAD stent with no restenosis RCA: Prox-mid diffuse 60% calcific disease, followed by mid RCA occlusion, bypassed by patent SVG-PDA LIMA-LAD: Patnent, no significant disease SVG-RPDA: Patent, no significant disease LVEDP 4 mmHg Severe native vessel disease Patent 2/2 grafts No acute culprit lesion identified Continue medical management, especially aggressive blood pressure control Okay to discharge patient home later this evening after 2 hour bed rest is completed and blood pressure is under control (SBP <150 mmHg). Newman JINNY Lawrence, MD   DG Chest 1 View Result Date: 02/11/2024 CLINICAL DATA:  Chest pain. EXAM: CHEST  1 VIEW COMPARISON:  March 29, 2013 FINDINGS: Multiple sternal wires and vascular clips are noted. Marked severity calcification of the aortic arch is seen. The heart size and mediastinal contours are within normal limits. Both lungs are clear. The visualized skeletal structures are unremarkable. IMPRESSION: 1. Evidence of prior median sternotomy/CABG. 2. No active cardiopulmonary disease. Electronically Signed   By: Suzen Dials M.D.   On: 02/11/2024 14:26    Disposition Pt is being discharged home today in good condition.  Follow-up Plans & Appointments  Follow-up Information     Jarold Lenis, PA-C. Go on 02/19/2024.   Specialty:  Physician Assistant Why: @10 :30am Contact information: [address] [place] [address] [phone]                Discharge Instructions     Diet - low sodium heart healthy   Complete by: As directed    Discharge instructions   Complete by: As directed    PLEASE DO NOT MISS ANY DOSES OF YOUR PLAVIX !!!!! Also keep a log of you blood pressures and bring back to your follow up appt. Please call the office with any questions.   Patients taking blood thinners should generally stay away from medicines like ibuprofen, Advil, Motrin, naproxen, and Aleve due to risk of stomach bleeding. You may take Tylenol  as directed or talk to your primary doctor about alternatives.  Some studies suggest Prilosec/Omeprazole interacts with Plavix . We changed your Prilosec/Omeprazole to  the equivalent dose of Protonix  for less chance of interaction.  PLEASE ENSURE THAT YOU DO NOT RUN OUT OF YOUR PLAVIX . This medication is very important to remain on. IF you have issues obtaining this medication due to cost please CALL the office 3-5 business days prior to running out in order to prevent missing doses of this medication.   Groin Site Care Refer to this sheet in the next few weeks. These instructions provide you with information on caring for yourself after your procedure. Your caregiver may also give you more specific instructions. Your treatment has been planned according to current medical practices, but problems sometimes occur. Call your caregiver if you have any problems or questions after your procedure. HOME CARE INSTRUCTIONS You may shower 24 hours after the procedure. Remove the bandage (dressing) and gently wash the site with plain soap and water. Gently pat the site dry.  Do not apply powder or lotion to the site.  Do not sit in a bathtub, swimming pool, or whirlpool for 5 to 7 days.  No bending, squatting, or lifting anything over 10 pounds (4.5 kg) as directed by your caregiver.  Inspect  the site at least twice daily.  Do not drive home if you are discharged the same day of the procedure. Have someone else drive you.  You may drive 24 hours after the procedure unless otherwise instructed by your caregiver.  What to expect: Any bruising will usually fade within 1 to 2 weeks.  Blood that collects in the tissue (hematoma) may be painful to the touch. It should usually decrease in size and tenderness within 1 to 2 weeks.  SEEK IMMEDIATE MEDICAL CARE IF: You have unusual pain at the groin site or down the affected leg.  You have redness, warmth, swelling, or pain at the groin site.  You have drainage (other than a small amount of blood on the dressing).  You have chills.  You have a fever or persistent symptoms for more than 72 hours.  You have a fever and your symptoms suddenly get worse.  Your leg becomes pale, cool, tingly, or numb.  You have heavy bleeding from the site. Hold pressure on the site. .   Increase activity slowly   Complete by: As directed        Discharge Medications Allergies as of 02/13/2024       Reactions   Cefuroxime  Shortness Of Breath   Metoprolol  Tartrate Other (See Comments)   Could NOT tolerate- dropped the heart rate very low and made the heart hurt        Medication List     PAUSE taking these medications    metFORMIN  500 MG 24 hr tablet Wait to take this until: February 15, 2024 Commonly known as: GLUCOPHAGE -XR Take 500 mg by mouth every evening.       STOP taking these medications    magnesium  oxide 400 (240 Mg) MG tablet Commonly known as: MAG-OX   metoprolol  tartrate 50 MG tablet Commonly known as: LOPRESSOR    potassium gluconate 595 (99 K) MG Tabs tablet       TAKE these medications    ALPRAZolam  0.5 MG tablet Commonly known as: XANAX  Take 0.5 mg by mouth at bedtime.   amLODipine  10 MG tablet Commonly known as: NORVASC  TAKE 1 TABLET BY MOUTH DAILY   carvedilol  12.5 MG tablet Commonly known as: COREG  Take  1 tablet (12.5 mg total) by mouth 2 (two) times daily with a meal.   clopidogrel  75  MG tablet Commonly known as: PLAVIX  TAKE 1 TABLET BY MOUTH AT  BEDTIME What changed: when to take this   DULoxetine  30 MG capsule Commonly known as: CYMBALTA  Take 30 mg by mouth daily.   fenofibrate  160 MG tablet Take 160 mg by mouth daily at 6 PM.   ferrous sulfate  325 (65 FE) MG tablet Take 325 mg by mouth daily with breakfast.   FreeStyle Libre 2 Reader Espiridion as directed invitro checks blood sugars 4 times a day/Takes insulin  4 times a day   FreeStyle Libre 2 Sensor Misc Inject 1 Device into the skin every 14 (fourteen) days.   isosorbide  mononitrate 30 MG 24 hr tablet Commonly known as: IMDUR  TAKE 1 TABLET BY MOUTH DAILY   Jardiance  10 MG Tabs tablet Generic drug: empagliflozin  Take 10 mg by mouth daily.   lisinopril  20 MG tablet Commonly known as: ZESTRIL  Take 1 tablet (20 mg total) by mouth daily. Start taking on: February 14, 2024 What changed: when to take this   nitrofurantoin  (macrocrystal-monohydrate) 100 MG capsule Commonly known as: MACROBID  Take 100 mg by mouth at bedtime.   nitroGLYCERIN  0.4 MG SL tablet Commonly known as: NITROSTAT  DISSOLVE 1 TABLET UNDER THE  TONGUE EVERY 5 MINUTES AS NEEDED FOR CHEST PAIN. MAX OF 3 TABLETS IN 15 MINUTES. CALL 911 IF PAIN  PERSISTS. What changed:  how much to take how to take this when to take this reasons to take this additional instructions   ONE TOUCH ULTRA TEST test strip Generic drug: glucose blood 1 each by Other route daily.   Ozempic (1 MG/DOSE) 4 MG/3ML Sopn Generic drug: Semaglutide (1 MG/DOSE) Inject 1 mg into the skin every Wednesday.   pantoprazole  40 MG tablet Commonly known as: PROTONIX  Take 40 mg by mouth daily as needed (INDIGESTION).   rosuvastatin  40 MG tablet Commonly known as: CRESTOR  Take 40 mg by mouth at bedtime.   Tylenol  8 Hour Arthritis Pain 650 MG CR tablet Generic drug: acetaminophen  Take  1,300 mg by mouth at bedtime as needed for pain.   Vascepa  1 g capsule Generic drug: icosapent  Ethyl Take 2 capsules (2 g total) by mouth 2 (two) times daily.   vitamin C 1000 MG tablet Take 1,000 mg by mouth daily.         Outstanding Labs/Studies Recheck Triglycerides in 6-8 weeks   Duration of Discharge Encounter: APP Time: 20 minutes   Signed, Leontine LOISE Salen, PA-C 02/13/2024, 2:36 PM  Patient seen, examined. Available data reviewed. Agree with findings, assessment, and plan as outlined by Leontine Salen, PA. Please see my progress note this same date for full details of the patient's exam, assessment, plan, and DC recommendations.   MD time spent conducting this evaluation is equal to 30 minutes. This includes review of her cardiac catheterization, echocardiogram, carotid duplex scan, my personal exam of the patient, discussion with the patient and family regarding her medications and discharge instructions as well as long-term management of her hypertension and chronic angina.   Ozell Fell, M.D. 02/14/2024 2:15 PM

## 2024-02-12 NOTE — H&P (View-Only) (Signed)
  Progress Note  Patient Name: Darlene Berry Date of Encounter: 02/12/2024 [facility] Cardiologist: Arnoldo Lapping, MD   Interval Summary   No chest pain or shortness of breath overnight.  Vital Signs Vitals:   02/12/24 0200 02/12/24 0343 02/12/24 0345 02/12/24 0400  BP: (!) 146/53  (!) 124/53 (!) 133/49  Pulse: 91  92 94  Resp: (!) 21  20 20   Temp:  98.2 F (36.8 C)    TempSrc:  Oral    SpO2: 97%  97% 97%    Intake/Output Summary (Last 24 hours) at 02/12/2024 0650 Last data filed at 02/11/2024 2002 Gross per 24 hour  Intake --  Output 800 ml  Net -800 ml      02/02/2024    9:37 AM 06/02/2023    2:46 PM 05/28/2022   10:13 AM  Last 3 Weights  Weight (lbs) 141 lb 6.4 oz 121 lb 6.4 oz 125 lb  Weight (kg) 64.139 kg 55.067 kg 56.7 kg      Telemetry/ECG  Sinus rhythm- Personally Reviewed   Assessment & Plan   Coronary artery disease Unstable angina Patient s/p CABG in 2014 admitted by cardiology after presenting to the ED with 2 weeks of progressive chest discomfort and shortness of breath. Associated weakness and fatigue also noted. Pain has been responsive to NTG. After being seen in office on 6/9, was set up for outpatient PET stress testing, however, presented to the ED with worsening symptoms. EKG this admission without acute ischemic changes. Troponin is not elevated. Last LHC in 2017 noted patency of her LIMA graft, saphenous vein graft to the RCA, and native circumflex stent. LHC today Echocardiogram pending Continue ASA 81mg  with Plavix  75mg  Continue Imdur  30mg  daily Continue Crestor  40mg  and Fenofibrate  160mg  Patient reportedly not taking her Lopressor . Attributes some of her above symptoms to this.  Hypertension Borderline elevate BP this admission. Continue Lisinopril  20mg  Continue Imdur  30mg  Continue Amlodipine  10mg   DM type II Last A1c 7.4%.  SSI while inpatient Continue Jardiance 10mg          For questions or updates, please  contact [facility] Please consult [email or web address] for contact info under       Signed, Leala Prince, PA-C   Patient seen, examined. Available data reviewed. Agree with findings, assessment, and plan as outlined by Leala Prince, PA-C.  Please see my other note the same date.  Arnoldo Lapping, M.D. 02/12/2024 7:57 AM

## 2024-02-13 ENCOUNTER — Other Ambulatory Visit (HOSPITAL_COMMUNITY): Payer: Self-pay

## 2024-02-13 ENCOUNTER — Telehealth: Payer: Self-pay

## 2024-02-13 ENCOUNTER — Telehealth (HOSPITAL_COMMUNITY): Payer: Self-pay | Admitting: Pharmacy Technician

## 2024-02-13 DIAGNOSIS — I25110 Atherosclerotic heart disease of native coronary artery with unstable angina pectoris: Secondary | ICD-10-CM | POA: Diagnosis not present

## 2024-02-13 DIAGNOSIS — I200 Unstable angina: Secondary | ICD-10-CM | POA: Diagnosis not present

## 2024-02-13 DIAGNOSIS — I160 Hypertensive urgency: Secondary | ICD-10-CM | POA: Diagnosis not present

## 2024-02-13 DIAGNOSIS — N390 Urinary tract infection, site not specified: Secondary | ICD-10-CM | POA: Diagnosis not present

## 2024-02-13 DIAGNOSIS — E781 Pure hyperglyceridemia: Secondary | ICD-10-CM | POA: Diagnosis not present

## 2024-02-13 LAB — GLUCOSE, CAPILLARY
Glucose-Capillary: 298 mg/dL — ABNORMAL HIGH (ref 70–99)
Glucose-Capillary: 270 mg/dL — ABNORMAL HIGH (ref 70–99)

## 2024-02-13 MED ORDER — ICOSAPENT ETHYL 1 G PO CAPS
2.0000 g | ORAL_CAPSULE | Freq: Two times a day (BID) | ORAL | Status: DC
Start: 2024-02-13 — End: 2024-02-13
  Administered 2024-02-13: 2 g via ORAL
  Filled 2024-02-13 (×2): qty 2

## 2024-02-13 MED ORDER — CARVEDILOL 12.5 MG PO TABS
12.5000 mg | ORAL_TABLET | Freq: Two times a day (BID) | ORAL | Status: DC
  Administered 2024-02-13: 12.5 mg via ORAL
  Filled 2024-02-13: qty 1

## 2024-02-13 MED ORDER — CARVEDILOL 12.5 MG PO TABS
12.5000 mg | ORAL_TABLET | Freq: Two times a day (BID) | ORAL | 1 refills | Status: DC
Start: 2024-02-13 — End: 2024-04-08
  Filled 2024-02-13: qty 60, 30d supply, fill #0

## 2024-02-13 MED ORDER — LISINOPRIL 20 MG PO TABS
20.0000 mg | ORAL_TABLET | Freq: Every day | ORAL | Status: DC
  Administered 2024-02-13: 20 mg via ORAL
  Filled 2024-02-13: qty 1

## 2024-02-13 MED ORDER — VASCEPA 1 G PO CAPS
2.0000 g | ORAL_CAPSULE | Freq: Two times a day (BID) | ORAL | 1 refills | Status: DC
  Filled 2024-02-13: qty 120, 30d supply, fill #0

## 2024-02-13 MED ORDER — LISINOPRIL 20 MG PO TABS
20.0000 mg | ORAL_TABLET | Freq: Every day | ORAL | 1 refills | Status: DC
Start: 2024-02-14 — End: 2024-05-10
  Filled 2024-02-13: qty 60, 60d supply, fill #0

## 2024-02-13 MED ORDER — AMLODIPINE BESYLATE 10 MG PO TABS
10.0000 mg | ORAL_TABLET | Freq: Every day | ORAL | Status: DC
  Administered 2024-02-13: 10 mg via ORAL
  Filled 2024-02-13: qty 1

## 2024-02-13 MED ORDER — ISOSORBIDE MONONITRATE ER 30 MG PO TB24
30.0000 mg | ORAL_TABLET | Freq: Every day | ORAL | Status: DC
  Administered 2024-02-13: 30 mg via ORAL
  Filled 2024-02-13: qty 1

## 2024-02-13 NOTE — TOC CM/SW Note (Signed)
 Transition of Care [facility]) - Inpatient Brief Assessment   Patient Details  Name: Darlene Berry MRN: [id number] Date of Birth: [birth year]  [facility]) CM/SW Contact:    Jennett Model, RN Phone Number: 02/13/2024, 1:36 PM   Clinical Narrative: From home with son and DIL, has PCP and insurance on file, states has no HH services in place at this time or DME at home.  States family member will transport them home at [employer] and family is support system, states gets medications from [facility] in [facility].  Pta self ambulatory.  Patient gives this NCM permission to speak with son.  She eats a regular diet at home and she has a scale.  She states she will try to do better with her diet by using less sodium.    Transition of Care Asessment: Insurance and Status: Insurance coverage has been reviewed Patient has primary care physician: Yes Home environment has been reviewed: home with son and DIL Prior level of function:: indep Prior/Current Home Services: No current home services Social Drivers of Health Review: SDOH reviewed no interventions necessary Readmission risk has been reviewed: Yes Transition of care needs: no transition of care needs at this time

## 2024-02-13 NOTE — Progress Notes (Addendum)
 Rounding Note   Patient Name: Darlene Berry Date of Encounter: 02/13/2024  [facility] Cardiologist: Arnoldo Lapping, MD   Subjective She notes feeling much better.  She had ongoing chest pain until about 2 AM last night, nausea stopped after zofran  administration. No chest pain at this time.  She does feel fatigued. She does have a mild headache though it does not bother her.  Urinary retention has resolved.  She denies lightheadedness/dizziness,  shortness of breath, or discomfort at femoral access site.  On interview patient reports taking Macrobid  for years due to recurrent UTIs.  She was only recently started on Jardiance, denies UTIs since beginning medication.  Scheduled Meds:  amLODipine   10 mg Oral Daily   aspirin  EC  81 mg Oral Daily   carvedilol   6.25 mg Oral BID WC   clopidogrel   75 mg Oral QHS   DULoxetine  30 mg Oral Daily   empagliflozin  10 mg Oral Daily   fenofibrate   160 mg Oral q1800   heparin   5,000 Units Subcutaneous Q8H   insulin  aspart  0-9 Units Subcutaneous TID WC   isosorbide  mononitrate  30 mg Oral Daily   lisinopril   20 mg Oral Daily   nitrofurantoin  (macrocrystal-monohydrate)  100 mg Oral QHS   rosuvastatin   40 mg Oral QHS   sodium chloride  flush  3 mL Intravenous Q12H   Continuous Infusions:  sodium chloride      nitroGLYCERIN  30 mcg/min (02/13/24 0523)   PRN Meds: sodium chloride , acetaminophen , ALPRAZolam , hydrALAZINE , nitroGLYCERIN , ondansetron  (ZOFRAN ) IV, sodium chloride  flush   Vital Signs  Vitals:   02/13/24 0200 02/13/24 0300 02/13/24 0500 02/13/24 0700  BP: (!) 169/60 (!) 136/57 (!) 123/59 (!) 141/55  Pulse:   99 98  Resp:   20 20  Temp:   98.2 F (36.8 C) 98.2 F (36.8 C)  TempSrc:   Oral Oral  SpO2:   100% 100%  Weight:   61.1 kg     Intake/Output Summary (Last 24 hours) at 02/13/2024 0900 Last data filed at 02/13/2024 2956 Gross per 24 hour  Intake 240 ml  Output 1600 ml  Net -1360 ml      02/13/2024     5:00 AM 02/02/2024    9:37 AM 06/02/2023    2:46 PM  Last 3 Weights  Weight (lbs) 134 lb 11.2 oz 141 lb 6.4 oz 121 lb 6.4 oz  Weight (kg) 61.1 kg 64.139 kg 55.067 kg      Telemetry Sinus tachycardia, average heart rate 100.  Most recent heart rate 95.  Occasional ectopy.- Personally Reviewed  Physical Exam GEN: No acute distress.   Neck: No JVD Cardiac: RRR, no murmurs, rubs, or gallops.  Respiratory: Clear to auscultation bilaterally. GI: Soft, mildly tender in the lower quadrants, non-distended  MS: No edema; No deformity.  Right femoral site without tenderness or ecchymosis.  Dressing clean and dry.  Neuro:  Nonfocal  Psych: Normal affect   Labs High Sensitivity Troponin:   Recent Labs  Lab 02/11/24 1349 02/12/24 1807 02/12/24 1936 02/12/24 2046 02/12/24 2210  TROPONINIHS 8 17 33* 38* 55*     Chemistry Recent Labs  Lab 02/11/24 1149 02/11/24 2053 02/12/24 0421 02/12/24 1609  NA 137  --  137  --   K 4.8  --  4.3  --   CL 102  --  105  --   CO2 22  --  19*  --   GLUCOSE 203*  --  186*  --  BUN 23  --  27*  --   CREATININE 1.23* 1.44* 1.15* 1.16*  CALCIUM  10.4*  --  9.2  --   GFRNONAA 46* 38* 50* 50*  ANIONGAP 13  --  13  --     Lipids  Recent Labs  Lab 02/12/24 0421  CHOL 99  TRIG 408*  HDL 30*  LDLCALC UNABLE TO CALCULATE IF TRIGLYCERIDE OVER 400 mg/dL  CHOLHDL 3.3    Hematology Recent Labs  Lab 02/11/24 2053 02/12/24 0421 02/12/24 1609  WBC 7.2 7.4 7.3  RBC 4.10 4.49 4.56  HGB 12.3 13.3 13.6  HCT 35.5* 39.1 38.7  MCV 86.6 87.1 84.9  MCH 30.0 29.6 29.8  MCHC 34.6 34.0 35.1  RDW 12.3 12.1 12.0  PLT 183 209 206   Thyroid No results for input(s): TSH, FREET4 in the last 168 hours.  BNPNo results for input(s): BNP, PROBNP in the last 168 hours.  DDimer No results for input(s): DDIMER in the last 168 hours.   Radiology  ECHOCARDIOGRAM COMPLETE Result Date: 02/12/2024    ECHOCARDIOGRAM REPORT   Patient Name:   Darlene Berry Date  of Exam: 02/12/2024 Medical Rec #:  [id number]       Height:       62.0 in Accession #:    [id number]      Weight:       141.4 lb Date of Birth:  1951-04-08       BSA:          1.650 m Patient Age:    73 years        BP:           184/56 mmHg Patient Gender: F               HR:           106 bpm. Exam Location:  Inpatient Procedure: 2D Echo (Both Spectral and Color Flow Doppler were utilized during            procedure). Indications:    chest pain  History:        Patient has prior history of Echocardiogram examinations, most                 recent 02/19/2013. CAD, Prior CABG; Risk Factors:Diabetes,                 Hypertension and Dyslipidemia.  Sonographer:    Dione Franks RDCS Referring Phys: [id number] XIKA ZHAO IMPRESSIONS  1. Mild intracavitary gradient of . Aaron Aas Left ventricular ejection fraction, by estimation, is >75%. The left ventricle has hyperdynamic function. The left ventricle has no regional wall motion abnormalities. There is moderate concentric left ventricular hypertrophy. Left ventricular diastolic parameters are consistent with Grade I diastolic dysfunction (impaired relaxation).  2. Right ventricular systolic function is normal. The right ventricular size is normal.  3. The mitral valve is normal in structure. Trivial mitral valve regurgitation. No evidence of mitral stenosis.  4. The aortic valve is normal in structure. Aortic valve regurgitation is not visualized. Aortic valve sclerosis/calcification is present, without any evidence of aortic stenosis.  5. The inferior vena cava is normal in size with greater than 50% respiratory variability, suggesting right atrial pressure of 3 mmHg. FINDINGS  Left Ventricle: Mild intracavitary gradient of . Left ventricular ejection fraction, by estimation, is >75%. The left ventricle has hyperdynamic function. The left ventricle has no regional wall motion abnormalities. The left ventricular internal cavity size was small. There is  moderate  concentric left ventricular hypertrophy. Left ventricular diastolic parameters are consistent with Grade I diastolic dysfunction (impaired relaxation). Right Ventricle: The right ventricular size is normal. No increase in right ventricular wall thickness. Right ventricular systolic function is normal. Left Atrium: Left atrial size was normal in size. Right Atrium: Right atrial size was normal in size. Pericardium: There is no evidence of pericardial effusion. Mitral Valve: The mitral valve is normal in structure. Mild mitral annular calcification. Trivial mitral valve regurgitation. No evidence of mitral valve stenosis. Tricuspid Valve: The tricuspid valve is normal in structure. Tricuspid valve regurgitation is not demonstrated. No evidence of tricuspid stenosis. Aortic Valve: The aortic valve is normal in structure. Aortic valve regurgitation is not visualized. Aortic valve sclerosis/calcification is present, without any evidence of aortic stenosis. Pulmonic Valve: The pulmonic valve was normal in structure. Pulmonic valve regurgitation is not visualized. No evidence of pulmonic stenosis. Aorta: The aortic root is normal in size and structure. Venous: The inferior vena cava is normal in size with greater than 50% respiratory variability, suggesting right atrial pressure of 3 mmHg. IAS/Shunts: No atrial level shunt detected by color flow Doppler.  LEFT VENTRICLE PLAX 2D LVIDd:         2.90 cm   Diastology LVIDs:         2.10 cm   LV e' medial:  4.57 cm/s LV PW:         0.90 cm   LV e' lateral: 8.92 cm/s LV IVS:        1.20 cm LVOT diam:     1.90 cm LV SV:         43 LV SV Index:   26 LVOT Area:     2.84 cm  RIGHT VENTRICLE             IVC RV Basal diam:  2.10 cm     IVC diam: 1.10 cm RV S prime:     12.00 cm/s TAPSE (M-mode): 0.7 cm LEFT ATRIUM             Index        RIGHT ATRIUM          Index LA diam:        3.00 cm 1.82 cm/m   RA Area:     9.37 cm LA Vol (A2C):   36.0 ml 21.82 ml/m  RA Volume:   17.80 ml  10.79 ml/m LA Vol (A4C):   36.5 ml 22.12 ml/m LA Biplane Vol: 36.9 ml 22.37 ml/m  AORTIC VALVE LVOT Vmax:   108.00 cm/s LVOT Vmean:  71.200 cm/s LVOT VTI:    0.150 m  AORTA Ao Root diam: 2.60 cm Ao Asc diam:  2.90 cm  SHUNTS Systemic VTI:  0.15 m Systemic Diam: 1.90 cm Arta Lark Electronically signed by Arta Lark Signature Date/Time: 02/12/2024/8:19:44 PM    Final    VAS US  CAROTID Result Date: 02/12/2024 Carotid Arterial Duplex Study Patient Name:  Danisa Astrid Blamer  Date of Exam:   02/12/2024 Medical Rec #: [id number]        Accession #:    [id number] Date of Birth: [birth year]        Patient Gender: F Patient Age:   2 years Exam Location:  [facility] Procedure:      VAS US  CAROTID Referring Phys: XIKA ZHAO --------------------------------------------------------------------------------  Indications:       Right bruit, Carotid artery disease and left endarterectomy. Risk Factors:      Hypertension,  hyperlipidemia, Diabetes, past history of                    smoking, coronary artery disease. Other Factors:     CABG 2014. Limitations        Today's exam was limited due to Shadowing from left CEA. Comparison Study:  Prior carotid duplex done at [facility] 05/07/22 indicating less                    than 50% ICA stenosis, bilaterally. Prior Carotid duplex done                    at [facility] 09/04/2016 indicating bilateral 1-39% ICA stenosis. Performing Technologist: Carleene Chase RVS  Examination Guidelines: A complete evaluation includes B-mode imaging, spectral Doppler, color Doppler, and power Doppler as needed of all accessible portions of each vessel. Bilateral testing is considered an integral part of a complete examination. Limited examinations for reoccurring indications may be performed as noted.  Right Carotid Findings: +----------+--------+--------+--------+------------------+--------+           PSV cm/sEDV cm/sStenosisPlaque DescriptionComments  +----------+--------+--------+--------+------------------+--------+ CCA Prox  92      12                                         +----------+--------+--------+--------+------------------+--------+ CCA Distal85      12              heterogenous               +----------+--------+--------+--------+------------------+--------+ ICA Prox  85      20      1-39%   heterogenous               +----------+--------+--------+--------+------------------+--------+ ICA Mid   108     22                                         +----------+--------+--------+--------+------------------+--------+ ICA Distal95      26                                         +----------+--------+--------+--------+------------------+--------+ ECA       163     7               heterogenous               +----------+--------+--------+--------+------------------+--------+ +----------+--------+-------+--------+-------------------+           PSV cm/sEDV cmsDescribeArm Pressure (mmHG) +----------+--------+-------+--------+-------------------+ [id number]                                        +----------+--------+-------+--------+-------------------+ +---------+--------+--+--------+--+ VertebralPSV cm/s44EDV cm/s19 +---------+--------+--+--------+--+  Left Carotid Findings: +----------+--------+--------+--------+------------------+------------------+           PSV cm/sEDV cm/sStenosisPlaque DescriptionComments           +----------+--------+--------+--------+------------------+------------------+ CCA Prox  105     18                                intimal thickening +----------+--------+--------+--------+------------------+------------------+ CCA Distal95  16                                intimal thickening +----------+--------+--------+--------+------------------+------------------+ ICA Prox  187     27      1-39%   heterogenous      Shadowing from CEA  +----------+--------+--------+--------+------------------+------------------+ ICA Mid   189     36                                                   +----------+--------+--------+--------+------------------+------------------+ ICA Distal156     39                                                   +----------+--------+--------+--------+------------------+------------------+ ECA       121     18                                                   +----------+--------+--------+--------+------------------+------------------+ +----------+--------+--------+--------+-------------------+           PSV cm/sEDV cm/sDescribeArm Pressure (mmHG) +----------+--------+--------+--------+-------------------+ Subclavian180                                         +----------+--------+--------+--------+-------------------+ +---------+--------+--+--------+-+ VertebralPSV cm/s55EDV cm/s7 +---------+--------+--+--------+-+   Summary: Right Carotid: Velocities in the right ICA are consistent with a 1-39% stenosis. Left Carotid: Velocities in the left ICA are consistent with a 1-39% stenosis. Subclavians: Normal flow hemodynamics were seen in bilateral subclavian              arteries. *See table(s) above for measurements and observations.  Electronically signed by Runell Countryman on 02/12/2024 at 5:48:58 PM.    Final    CARDIAC CATHETERIZATION Result Date: 02/12/2024 Images from the original result were not included. Coronary and bypass graft angiography 02/12/2024: LM: No significant disease LAD: Prox LAD, diag 1 50-60% disease, followed by mid LAD occlusion, bypassed by patent LIMA-LAD Lcx: Prox-mid diffuse 40% disease, patent mid LAD stent with no restenosis RCA: Prox-mid diffuse 60% calcific disease, followed by mid RCA occlusion, bypassed by patent SVG-PDA LIMA-LAD: Patnent, no significant disease SVG-RPDA: Patent, no significant disease LVEDP 4 mmHg Severe native vessel disease Patent 2/2 grafts No  acute culprit lesion identified Continue medical management, especially aggressive blood pressure control Okay to discharge patient home later this evening after 2 hour bed rest is completed and blood pressure is under control (SBP <150 mmHg). [name] Das, MD   DG Chest 1 View Result Date: 02/11/2024 CLINICAL DATA:  Chest pain. EXAM: CHEST  1 VIEW COMPARISON:  March 29, 2013 FINDINGS: Multiple sternal wires and vascular clips are noted. Marked severity calcification of the aortic arch is seen. The heart size and mediastinal contours are within normal limits. Both lungs are clear. The visualized skeletal structures are unremarkable. IMPRESSION: 1. Evidence of prior median sternotomy/CABG. 2. No active cardiopulmonary disease. Electronically Signed   By: Elizabeth Gulling.D.  On: 02/11/2024 14:26    Cardiac Studies Echocardiogram 02/12/2024 IMPRESSIONS     1. Mild intracavitary gradient of . Aaron Aas Left ventricular ejection  fraction, by estimation, is >75%. The left ventricle has hyperdynamic  function. The left ventricle has no regional wall motion abnormalities.  There is moderate concentric left  ventricular hypertrophy. Left ventricular diastolic parameters are  consistent with Grade I diastolic dysfunction (impaired relaxation).   2. Right ventricular systolic function is normal. The right ventricular  size is normal.   3. The mitral valve is normal in structure. Trivial mitral valve  regurgitation. No evidence of mitral stenosis.   4. The aortic valve is normal in structure. Aortic valve regurgitation is  not visualized. Aortic valve sclerosis/calcification is present, without  any evidence of aortic stenosis.   5. The inferior vena cava is normal in size with greater than 50%  respiratory variability, suggesting right atrial pressure of 3 mmHg   US  Carotid 02/12/2024 Summary:  Right Carotid: Velocities in the right ICA are consistent with a 1-39%  stenosis.   Left  Carotid: Velocities in the left ICA are consistent with a 1-39%  stenosis.   Subclavians: Normal flow hemodynamics were seen in bilateral subclavian               arteries.   Patient Profile   73 y.o. female with a past medical history of CAD s/p CABG in 2014 (LIMA to LAD, SVG to RCA) s/p PCI (DES to mid-circumflex), Carotid artery disease s/p L CEA, PAD s/p L CFA endarterectomy, T2DM, hypertension, and hyperlipidemia who presented to the ED on 02/12/2024 with chest pain.   Assessment & Plan  Hypertensive Urgency  Chest pain She has been having borderline high blood pressure this admission, and she did experience high blood pressures during LHC.  She was to be discharged if systolic were <150.  At 1642 patient's blood pressure was 184/56 and she started having severe left sided chest pain with associated left arm numbness, diaphoresis, and nausea. While her ECG showed mild ST depressions in the lateral leads, it was not significantly different from her prior ECG that morning. She received 2 SL nitroglycerin  and one IV hydralazine  10 mg which relieved the pain mildly. She was placed on a nitroglycerin  gtt which improved her chest pain. Later that evening, she noted return of her chest pain (8/10) when she walked to the restroom. She had an episode of emesis, was given zofran  and two more SL nitroglycerin  tabs. Her nitroglycerin  gtt was further up titrated with improvement in symptoms, though blood pressure remained high. In total overnight she was given Coreg  6.25mg , lisinopril  20 mg, and two IV hydralazine  10 mg along with the nitroglycerin  gtt. Her highest blood pressure overnight was 202/58. Her troponins were only mildly elevated (17-> 33-> 38-> 55).  Echocardiogram was obtained during the first episode of chest pain. This showed EF > 75% and no regional wall motion abnormalities.   Highly suspect chest pain was due to hypertension in the setting of a patient with known severe coronary artery  disease. Will attempt to wean her off the nitroglycerin  gtt and onto oral medications to control her blood pressure.   This morning BP 141/55 with nitroglycerin  titrated to 20mcg/min, chest pain-free.  -increase coreg  6.25 to coreg  12.5 mg BID -continue lisinopril  20 mg, will consider increasing dosage if needed.Of note on admission patient reported taking lisinopril  twice a day at home -Continue amlodipine  10 mg -continue imdur  30 mg -will wean nitroglycerin   as chest pain allows  CAD s/p CABG 2014 (LIMA to LAD, SVG to RCA) s/p PCI (DES to mid-circumflex) Unstable Angina Patient presented on 02/12/2024 with 2 week progressive chest discomfort and shortness of breath with associated fatigue and weakness.In the ED the ECG showed no evidence of ischemia and troponins were negative. She was taken to the cath lab on 02/12/2024 which showed patent grafts, severe native vessel disease. There were no culprit lesions to intervene on. She was to discharge on 02/12/2024, however she developed severe chest pain and high blood pressure overnight. (See above).    -continue ASA 81 mg -continue plavix  75 mg -continue imdur  30 mg -Continue sublingual nitroglycerin  0.04 mg as needed -increase coreg  6.25 to coreg  12.5 mg BID  She did have some urinary rentention overnight requiring an In-N-Out cath, resolved this morning.   Hyperlipidemia LDL cannot be calculated, but direct LDL 43  HDL 30   TG 408 -continue crestor  40 mg -continue fenofibrate  160 mg -consider starting vascepa, per pharmacy no copay for patient.    T2DM A1c 7.4% Has been on chronic Macrobid  for several years due to recurrent UTIs.  Was recently placed on Jardiance approximately a month ago at her PCP.  Denies UTI since medication initiation.  Reasonable to continue at this time. - Continue Jardiance 10 mg - Continue chronic Macrobid   -Continue to hold metformin , will need to hold at discharge for 72 hours due to iodinated contrast  7.   Anxiety Continue Cymbalta 30 mg Continue 0.5 mg Xanax  as needed   Medical Readiness Date: 02/13/2024    For questions or updates, please contact [facility] Please consult [email or web address] for contact info under     Signed, Mabel Savage, PA-C  02/13/2024, 9:00 AM    Patient seen, examined. Available data reviewed. Agree with findings, assessment, and plan as outlined by Willis Harter, PA-C.  Patient independently interviewed and examined.  She is alert, oriented, in no distress.  Family at bedside.  Lungs clear to auscultation bilaterally, heart regular rate and rhythm with 2/6 systolic ejection murmur at the right upper sternal border, abdomen soft and nontender, right groin site clear, no lower extremity edema.  Patient feeling better at the time my evaluation.  She held down some food this morning.  Nausea has resolved.  Now off of IV nitroglycerin .  Carvedilol  has been added to her medical regimen.  Cardiac catheterization showed patent bypass grafts and patent stent site in the circumflex.  Echo shows hyperdynamic LV function with LVEF greater than 75% with a 22 mmHg mid cavitary gradient.  There is no significant valvular disease.  Carotid duplex shows less than 40% ICA stenosis bilaterally.  I think the patient is medically ready for discharge.  She does have some recurrent chest discomfort that occurs when she becomes extremely hypertensive.  We are hopeful that the addition of carvedilol  along with continued use of lisinopril , amlodipine , and isosorbide  will manage her symptoms well.  Advised that she can use sublingual nitroglycerin  as needed at home.  Otherwise, as outlined above.  MD time spent conducting this evaluation is equal to 30 minutes.  This includes review of her cardiac catheterization, echocardiogram, carotid duplex scan, my personal exam of the patient, discussion with the patient and family regarding her medications and discharge instructions as well as long-term  management of her hypertension and chronic angina.  Arnoldo Lapping, M.D. 02/13/2024 12:16 PM

## 2024-02-13 NOTE — Telephone Encounter (Signed)
 Patient contacted regarding discharge from [facility] on 02/13/24  Patient understands to follow up with provider Lawana Pray on 02/24/24 at 02:45 pm at [facility]. Patient understands discharge instructions? Yes Patient understands medications and regiment?Yes Patient understands to bring all medications to this visit? Yes Ask patient:  Are you enrolled in My Chart No. She does not want to use it

## 2024-02-13 NOTE — Inpatient Diabetes Management (Addendum)
 Inpatient Diabetes Program Recommendations  AACE/ADA: New Consensus Statement on Inpatient Glycemic Control (2015)  Target Ranges:  Prepandial:   less than 140 mg/dL      Peak postprandial:   less than 180 mg/dL (1-2 hours)      Critically ill patients:  140 - 180 mg/dL   Lab Results  Component Value Date   GLUCAP 270 (H) 02/13/2024   HGBA1C 7.4 (H) 02/11/2024    Review of Glycemic Control  Latest Reference Range & Units 02/12/24 08:49 02/12/24 12:34 02/12/24 19:19 02/13/24 05:54 02/13/24 10:58  Glucose-Capillary 70 - 99 mg/dL 161 (H) 096 (H) 045 (H) 298 (H) 270 (H)  (H): Data is abnormally high  Diabetes history: DM2 Outpatient Diabetes medications: Lantus  50-60 units every day, Jardiance 10 mg every day, Metformin  800 mg QD Current orders for Inpatient glycemic control: Novolog  0-9 units TID and Jardiance 10 mg QD  Inpatient Diabetes Program Recommendations:    If he remains inpatient, please consider:  Semglee  25 units every day (50% of home dose).  Thank you, Hays Lipschutz, MSN, CDCES Diabetes Coordinator Inpatient Diabetes Program [phone] (team pager from 8a-5p)

## 2024-02-13 NOTE — TOC Transition Note (Signed)
 Transition of Care [facility]) - Discharge Note   Patient Details  Name: Darlene Berry MRN: [id number] Date of Birth: 01/29/1951  [facility] CM/SW Contact:  Jennett Model, RN Phone Number: 02/13/2024, 1:37 PM   Clinical Narrative:    For dc today, she has no needs.         Patient Goals and CMS Choice            Discharge Placement                       Discharge Plan and Services Additional resources added to the After Visit Summary for                                       Social Drivers of Health (SDOH) Interventions SDOH Screenings   Food Insecurity: No Food Insecurity (02/12/2024)  Housing: Low Risk  (02/12/2024)  Transportation Needs: No Transportation Needs (02/12/2024)  Utilities: Not At Risk (02/12/2024)  Social Connections: Moderately Isolated (02/12/2024)  Tobacco Use: Medium Risk (02/02/2024)     Readmission Risk Interventions     No data to display

## 2024-02-13 NOTE — Telephone Encounter (Signed)
   Transition of Care Follow-up Phone Call Request    Patient Name: Darlene Berry Date of Birth: 02-13-1951 Date of Encounter: 02/13/2024  Primary Care Provider:  Herb Loges, PA-C Primary Cardiologist:  Arnoldo Lapping, MD  Darlene Berry has been scheduled for a transition of care follow up appointment with a [facility] provider:  Lawana Pray NP 02/24/2024  Please reach out to Darlene Berry within 48 hours of discharge to confirm appointment and review transition of care protocol questionnaire. Anticipated discharge date: 02/13/2024  Darlene Savage, PA-C  02/13/2024, 1:42 PM

## 2024-02-13 NOTE — Telephone Encounter (Signed)
 Patient Product/process development scientist completed.    The patient is insured through [facility]. Patient has Medicare and is not eligible for a copay card, but may be able to apply for patient assistance or Medicare RX Payment Plan (Patient Must reach out to their plan, if eligible for payment plan), if available.    Ran test claim for Vascepa 1 g and the current 30 day co-pay is $0.00.  Ran test claim for icosapent ethyl (Vascepa) 1 g and Product Not Covered  This test claim was processed through [employer]- copay amounts may vary at other pharmacies due to [employer], or as the patient moves through the different stages of their insurance plan.     Morgan Arab, CPHT Pharmacy Technician III Certified Patient Advocate [facility] Patient Advocate Team Direct Number: [phone]  Fax: [phone]

## 2024-02-14 LAB — LIPOPROTEIN A (LPA): Lipoprotein (a): 48 nmol/L — ABNORMAL HIGH (ref <75.0)

## 2024-02-22 NOTE — Progress Notes (Deleted)
 Cardiology Clinic Note   Patient Name: Darlene Berry Date of Encounter: 02/22/2024  Primary Care Provider:  Jarold Lenis, PA-C Primary Cardiologist:  Ozell Fell, MD  Patient Profile    Darlene Berry 73 year old female presents the clinic today for follow-up evaluation of her exertional dyspnea, coronary artery disease, and hyperlipidemia.  Past Medical History    Past Medical History:  Diagnosis Date   Acute cystitis without hematuria 10/29/2016   Anemia    after tubal preg. - rec'd bld. transfusion     Anxiety    Arthritis    CAD (coronary artery disease)    a. s/p CABG in 2014 // b. LHC 2/15: grafts patent; OM1 80% >> PCI: Xience DES to OM1 // c. Myoview 9/16: no ischemia or scar, EF 67; Low Risk // d. LHC 10/17: pLAD 60, D1 50, Lat D1 70, oLCx 40, mLCx stent patent, pRCA 90, mRCA 99, EF 65 >> Med Rx.   Carotid artery disease (HCC)    Carotid US  1/16: bilateral ICA 1-39 >> FU 08/2016 // Carotid US  1/18: 0-39% bilat ICA; Elevated blood flow velocities in the subclavian arteries, bilaterally; FU 1 year.   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 Echo 6/14 (done at [facility]):  Mild LVH, EF 65-60%, Gr 2 DD.   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   LIMA to LAD, SVG to RCA, EVH via right thigh   Sleep apnea    uses CPAP q night - assessment, 2011- in [facility]    Type II or unspecified type diabetes mellitus without mention of complication, not stated as uncontrolled  02/17/2009   Overview:  Type 2 Diabetes Mellitus - Uncomplicated, Controlled   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   Past Surgical History:  Procedure Laterality Date   ABDOMINAL HYSTERECTOMY     APPENDECTOMY     BREAST SURGERY     R breast- biopsy- benign    CARDIAC CATHETERIZATION     x 3   CARDIAC CATHETERIZATION N/A 06/05/2016   Procedure: Left Heart Cath and Cors/Grafts Angiography;  Surgeon: Ozell Fell, MD;  Location: [facility] INVASIVE CV LAB;  Service: Cardiovascular;  Laterality: N/A;   CAROTID ENDARTERECTOMY Left    CORONARY ARTERY BYPASS GRAFT N/A 03/10/2013   Procedure: CORONARY ARTERY BYPASS GRAFTING (CABG);  Surgeon: Sudie VEAR Laine, MD;  Location: [facility] OR;  Service: Open Heart Surgery;  Laterality: N/A;  x2 using right greater saphenous vein and left internal mammary.    CORONARY STENT PLACEMENT  09/30/2013   DES  LEFT CIRCUMFLEX    DR COOPER   ENDOVEIN HARVEST OF GREATER SAPHENOUS VEIN Right 03/10/2013   Procedure: ENDOVEIN HARVEST OF GREATER SAPHENOUS VEIN;  Surgeon: Sudie VEAR Laine, MD;  Location: [facility] OR;  Service: Open Heart Surgery;  Laterality: Right;   FRACTURE SURGERY     L foot- /  w screw    INTRAOPERATIVE TRANSESOPHAGEAL ECHOCARDIOGRAM N/A 03/10/2013   Procedure: INTRAOPERATIVE TRANSESOPHAGEAL ECHOCARDIOGRAM;  Surgeon: Sudie VEAR Laine, MD;  Location: [facility] OR;  Service: Open Heart Surgery;  Laterality: N/A;   LEFT HEART CATH AND CORS/GRAFTS ANGIOGRAPHY N/A 02/12/2024   Procedure: LEFT HEART CATH AND CORS/GRAFTS ANGIOGRAPHY;  Surgeon: Elmira Newman PARAS, MD;  Location: [facility] INVASIVE CV LAB;  Service: Cardiovascular;  Laterality: N/A;   LEFT HEART CATHETERIZATION WITH CORONARY/GRAFT ANGIOGRAM N/A 09/30/2013   Procedure: LEFT HEART CATHETERIZATION WITH EL BILE;  Surgeon: Ozell JONETTA Fell, MD;  Location: [facility] CATH LAB;  Service: Cardiovascular;  Laterality: N/A;   SHOULDER SURGERY Left    TUBAL LIGATION      Allergies  Allergies  Allergen  Reactions   Cefuroxime  Shortness Of Breath   Metoprolol  Tartrate Other (See Comments)    Could NOT tolerate- dropped the heart rate very low and made the heart hurt    History of Present Illness    Darlene Berry has a PMH of coronary artery disease, hyperlipidemia, hypertension, carotid artery disease, peripheral arterial disease.  Coronary artery disease  S/p CABG in 2014 S/p DES to LCx (non-grafted) Myoview 9/16: low risk  Cath 2017: patent grafts, patent LCx stent >> Med Rx Diabetes mellitus  Hypertension  Hyperlipidemia  Carotid artery disease S/p L CEA  Peripheral arterial disease  S/p L CFA endarterectomy  She was seen in follow-up by Dr. Fell on 06/02/2023.  During that time she remained stable from a cardiac standpoint.  She continued to lose weight with Ozempic.  She reported she was using less insulin .  Her blood pressure was well-controlled.  She denied chest pain, palpitations, shortness of breath.  She was trying to maintain her weight.  She noted that her weight had gone down to 117 pounds and she had intentionally gained a few pounds after that.  She was receiving regular lab work from her PCP every 3 months.  Her cholesterol was also managed by her PCP.  She presents to the clinic today for follow-up evaluation and states over the past 2 months she has noticed she has had more stress related to caring for family members.  In the last 1 to 1-1/2 weeks she has noticed more shortness of breath all the time and increased shortness of breath with exertion.  She also describes  a dropping type sensation/feeling.  She notes that she has gained about 26 pounds since last August.  She has been eating more.  She also notes some diarrhea that she attributed to her metformin .  She has been seen and evaluated by GI.  They are recommending upper and lower endoscopy.  She noted that previously her symptoms would happen intermittently and be brief.  We reviewed her medications.  She  will have her symptoms are related to her metoprolol .  We reviewed her previous medical history.  Her blood pressure today is 132/60.  Her EKG today shows changes in lead I and aVL.  I will order CBC, BMP, magnesium , echocardiogram, cardiac PET/CT, and plan follow-up in 1-2 months..  Today she denies chest pain,  lower extremity edema, fatigue, palpitations, melena, hematuria, hemoptysis, diaphoresis, weakness, presyncope, syncope, orthopnea, and PND.  DOE-Notes increased work of breathing at rest and with increased physical activity.  Notes a change over the past 2 months but more so in the last 1-1-1/2 weeks.  Fairly physically active caring for her brother and her great-grandson Order echocardiogram, cardiac PET/CT Increase physical activity as tolerated Heart  healthy low-sodium diet Order CBC, BMP, magnesium   Coronary artery disease-no chest pain today.  Denies exertional chest discomfort.  Underwent CABG in 2014 and is status post PCI with DES to her circumflex.  Stress testing 9/16 was low risk.  Cardiac catheterization 2017 showed patent grafts and patent circumflex stent. Heart healthy low-sodium diet Maintain physical activity Continue amlodipine , clopidogrel , fenofibrate , isosorbide , lisinopril , rosuvastatin   Hyperlipidemia-managed with PCP High-fiber diet Continue fenofibrate , rosuvastatin   Essential hypertension-BP today 132/60. Maintain blood pressure log Continue metoprolol , Imdur   Carotid artery disease-carotid ultrasound 6/14 showed mild plaque. Continue rosuvastatin , fenofibrate  Repeat carotid ultrasound  Peripheral vascular disease-denies claudication. Maintain physical activity Continue clopidogrel , fenofibrate , rosuvastatin    Disposition: Follow-up with Dr. Wonda or me in 1-2 months.   Home Medications    Prior to Admission medications   Medication Sig Start Date End Date Taking? Authorizing Provider  ALPRAZolam  (XANAX ) 0.5 MG tablet Take 0.5 mg by mouth  daily.    [provider]  amLODipine  (NORVASC ) 10 MG tablet TAKE 1 TABLET BY MOUTH DAILY 01/26/24   Wonda Sharper, MD  Ascorbic Acid (VITAMIN C) 1000 MG tablet Take 2,000 mg by mouth daily.    [provider]  clopidogrel  (PLAVIX ) 75 MG tablet TAKE 1 TABLET BY MOUTH AT  BEDTIME 06/11/23   Wonda Sharper, MD  COENZYME Q10 PO Take 100 mg by mouth daily.    [provider]  Continuous Glucose Receiver (FREESTYLE LIBRE 2 READER) DEVI as directed invitro checks blood sugars 4 times a day/Takes insulin  4 times a day 06/03/22   [provider]  Continuous Glucose Sensor (FREESTYLE LIBRE 14 DAY SENSOR) MISC as directed apply once every 2 weeks for 30 days 06/03/22   [provider]  Continuous Glucose Sensor (FREESTYLE LIBRE 2 SENSOR) MISC USE AS DIRECTED EVERY 2 WEEKS 28    [provider]  DULoxetine  (CYMBALTA ) 30 MG capsule Take 30 mg by mouth daily.  07/26/14   [provider]  fenofibrate  160 MG tablet Take 160 mg by mouth at bedtime.     [provider]  ibuprofen (ADVIL) 800 MG tablet as needed. 10/07/22   [provider]  insulin  glargine (LANTUS ) 100 UNIT/ML injection Inject 50 Units into the skin as needed.    [provider]  IRON PO Take 1 tablet by mouth daily.    [provider]  isosorbide  mononitrate (IMDUR ) 30 MG 24 hr tablet TAKE 1 TABLET BY MOUTH DAILY 01/26/24   Wonda Sharper, MD  lisinopril  (ZESTRIL ) 20 MG tablet TAKE 1 TABLET BY MOUTH TWICE  DAILY 06/11/23   Wonda Sharper, MD  Magnesium  400 MG CAPS Take by mouth daily in the afternoon.    [provider]  metformin  (FORTAMET ) 1000 MG (OSM) 24 hr tablet Take 1,000 mg by mouth 2 (two) times daily.    [provider]  metoprolol  tartrate (LOPRESSOR ) 50 MG tablet TAKE 1 AND 1/2 TABLETS BY MOUTH  TWICE DAILY 06/11/23   Wonda Sharper, MD  nitrofurantoin  (MACRODANTIN ) 100 MG capsule Take 100 mg by mouth at bedtime. With food  or milk    [provider]  nitroGLYCERIN  (NITROSTAT ) 0.4 MG SL tablet DISSOLVE 1 TABLET UNDER THE  TONGUE EVERY 5 MINUTES AS NEEDED FOR CHEST PAIN. MAX OF 3 TABLETS IN 15 MINUTES. CALL 911 IF PAIN  PERSISTS. 06/28/22   Wonda Sharper, MD  ONE TOUCH ULTRA TEST test strip 1 each by Other route daily.  03/15/15   [provider]  OZEMPIC,  0.25 OR 0.5 MG/DOSE, 2 MG/1.5ML SOPN Inject into the skin once a week. 10/09/20   [provider]  pantoprazole  (PROTONIX ) 40 MG tablet Take 40 mg by mouth daily as needed (INDIGESTION).    [provider]  potassium gluconate 595 (99 K) MG TABS tablet Take 595 mg by mouth daily.    [provider]  rosuvastatin  (CRESTOR ) 40 MG tablet Take 40 mg by mouth daily. 04/10/22   [provider]    Family History    Family History  Problem Relation Age of Onset   Cancer Mother    Diabetes Father    Hypertension Father    Hyperlipidemia Father    Heart disease Father    Leukemia Sister    Diabetes Brother    Hyperlipidemia Brother    Hypertension Brother    Heart disease Brother    She indicated that her mother is deceased. She indicated that her father is deceased. She indicated that the status of her sister is unknown. She indicated that her maternal grandmother is deceased. She indicated that her maternal grandfather is deceased. She indicated that her paternal grandmother is deceased. She indicated that her paternal grandfather is deceased.  Social History    Social History   Socioeconomic History   Marital status: Divorced    Spouse name: Not on file   Number of children: Not on file   Years of education: Not on file   Highest education level: Not on file  Occupational History   Not on file  Tobacco Use   Smoking status: Former    Current packs/day: 0.00    Types: Cigarettes    Quit date: 03/10/2013    Years since quitting: 10.9   Smokeless tobacco: Never  Substance and Sexual Activity    Alcohol use: No    Comment: beer- rare occasion    Drug use: No   Sexual activity: Not on file  Other Topics Concern   Not on file  Social History Narrative   Not on file   Social Drivers of Health   Financial Resource Strain: Not on file  Food Insecurity: No Food Insecurity (02/12/2024)   Hunger Vital Sign    Worried About Running Out of Food in the Last Year: Never true    Ran Out of Food in the Last Year: Never true  Transportation Needs: No Transportation Needs (02/12/2024)   PRAPARE - Administrator, Civil Service (Medical): No    Lack of Transportation (Non-Medical): No  Physical Activity: Not on file  Stress: Not on file  Social Connections: Moderately Isolated (02/12/2024)   Social Connection and Isolation Panel    Frequency of Communication with Friends and Family: More than three times a week    Frequency of Social Gatherings with Friends and Family: More than three times a week    Attends Religious Services: More than 4 times per year    Active Member of [employer] or Organizations: No    Attends Banker Meetings: Patient declined    Marital Status: Divorced  Catering manager Violence: Not At Risk (02/12/2024)   Humiliation, Afraid, Rape, and Kick questionnaire    Fear of Current or Ex-Partner: No    Emotionally Abused: No    Physically Abused: No    Sexually Abused: No     Review of Systems    General:  No chills, fever, night sweats or weight changes.  Cardiovascular:  No chest pain, dyspnea on exertion, edema, orthopnea,  palpitations, paroxysmal nocturnal dyspnea. Dermatological: No rash, lesions/masses Respiratory: No cough, dyspnea Urologic: No hematuria, dysuria Abdominal:   No nausea, vomiting, diarrhea, bright red blood per rectum, melena, or hematemesis Neurologic:  No visual changes, wkns, changes in mental status. All other systems reviewed and are otherwise negative except as noted above.  Physical Exam    VS:  There were no  vitals taken for this visit. , BMI There is no height or weight on file to calculate BMI. GEN: Well nourished, well developed, in no acute distress. HEENT: normal. Neck: Supple, no JVD, carotid bruits, or masses. Cardiac: RRR, no murmurs, rubs, or gallops. No clubbing, cyanosis, edema.  Radials/DP/PT 2+ and equal bilaterally.  Respiratory:  Respirations regular and unlabored, clear to auscultation bilaterally. GI: Soft, nontender, nondistended, BS + x 4. MS: no deformity or atrophy. Skin: warm and dry, no rash. Neuro:  Strength and sensation are intact. Psych: Normal affect.  Accessory Clinical Findings    Recent Labs: 02/02/2024: Magnesium  2.1 02/12/2024: BUN 27; Creatinine, Ser 1.16; Hemoglobin 13.6; Platelets 206; Potassium 4.3; Sodium 137   Recent Lipid Panel    Component Value Date/Time   CHOL 99 02/12/2024 0421   TRIG 408 (H) 02/12/2024 0421   HDL 30 (L) 02/12/2024 0421   CHOLHDL 3.3 02/12/2024 0421   VLDL UNABLE TO CALCULATE IF TRIGLYCERIDE OVER 400 mg/dL 93/80/7974 9578   LDLCALC UNABLE TO CALCULATE IF TRIGLYCERIDE OVER 400 mg/dL 93/80/7974 9578   LDLDIRECT 43 02/12/2024 0421    No BP recorded.  {Refresh Note OR Click here to enter BP  :1}***    ECG personally reviewed by me today-     LHC 06/05/2016 1. 3 vessel CAD 2. S/P CABG with continued patency of the LIMA-LAD and SVG-distal RCA 3. Continued patency of the stented segment in the left circumflex 4. Normal LV function   Recommendation: medical therapy for residual CAD, adjustment of antihypertensive Rx, risk reduction/tobacco cessation  Diagnostic Dominance: Right  Intervention     Assessment & Plan   1.  DOE-Notes increased work of breathing at rest and with increased physical activity.  Notes a change over the past 2 months but more so in the last 1-1-1/2 weeks.  Fairly physically active caring for her brother and her great-grandson.  EKG changes noted in lead I and aVL. Order echocardiogram, cardiac  PET/CT Increase physical activity as tolerated Heart healthy low-sodium diet Order CBC, BMP, magnesium   Informed Consent   Shared Decision Making/Informed Consent The risks [chest pain, shortness of breath, cardiac arrhythmias, dizziness, blood pressure fluctuations, myocardial infarction, stroke/transient ischemic attack, nausea, vomiting, allergic reaction, radiation exposure, metallic taste sensation and life-threatening complications (estimated to be 1 in 10,000)], benefits (risk stratification, diagnosing coronary artery disease, treatment guidance) and alternatives of a cardiac PET stress test were discussed in detail with Darlene Berry and she agrees to proceed.      Coronary artery disease-no chest pain today.  Denies exertional chest discomfort.  Underwent CABG in 2014 and is status post PCI with DES to her circumflex.  Stress testing 9/16 was low risk.  Cardiac catheterization 2017 showed patent grafts and patent circumflex stent. Heart healthy low-sodium diet Maintain physical activity Continue amlodipine , clopidogrel , fenofibrate , isosorbide , lisinopril , rosuvastatin   Hyperlipidemia-managed with PCP High-fiber diet Continue fenofibrate , rosuvastatin   Essential hypertension-BP today 132/60. Maintain blood pressure log Continue metoprolol , Imdur   Carotid artery disease-carotid ultrasound 6/14 showed mild plaque. Continue rosuvastatin , fenofibrate  Repeat carotid ultrasound  Peripheral vascular disease-denies claudication. Maintain physical activity Continue clopidogrel , fenofibrate ,  rosuvastatin    Disposition: Follow-up with Dr. Wonda or me in 1-2 months.   Josefa HERO. Shawonda Kerce NP-C     02/22/2024, 10:34 PM [facility] [address] Office [phone] Fax [phone]    I spent 15*** minutes examining this patient, reviewing medications, and using patient centered shared decision making involving their cardiac care.   I spent   20 minutes reviewing past medical history,  medications, and prior cardiac tests.

## 2024-02-24 ENCOUNTER — Ambulatory Visit: Admitting: General Practice

## 2024-03-02 ENCOUNTER — Other Ambulatory Visit (HOSPITAL_COMMUNITY): Payer: Self-pay

## 2024-03-16 ENCOUNTER — Other Ambulatory Visit (HOSPITAL_COMMUNITY)

## 2024-03-22 ENCOUNTER — Other Ambulatory Visit: Payer: Self-pay | Admitting: Cardiovascular Disease

## 2024-04-06 ENCOUNTER — Other Ambulatory Visit: Payer: Self-pay

## 2024-04-23 ENCOUNTER — Telehealth (HOSPITAL_COMMUNITY): Payer: Self-pay | Admitting: *Deleted

## 2024-04-23 NOTE — Telephone Encounter (Signed)
 Patient had a cardiac cath after the PET study was ordered.  Discussed with Dr. Wonda, patient does not need to undergo the PET study. Called the patient and left a message regarding this.  Chantal Requena RN Navigator Cardiac Imaging [facility] [phone] Office [phone] Cell

## 2024-04-27 ENCOUNTER — Inpatient Hospital Stay (HOSPITAL_COMMUNITY): Admission: RE | Admit: 2024-04-27 | Source: Ambulatory Visit

## 2024-05-09 ENCOUNTER — Other Ambulatory Visit: Payer: Self-pay | Admitting: Cardiovascular Disease

## 2024-06-02 ENCOUNTER — Other Ambulatory Visit: Payer: Self-pay

## 2024-06-25 NOTE — Progress Notes (Signed)
 " Cardiology Office Note   Date:  07/07/2024  ID:  Zohra, Clavel February 25, 1951, MRN [id number] PCP: Jarold Alm RIGGERS  [facility] Providers Cardiologist:  Ozell Fell, MD Cardiology APP:  Lelon Glendia DASEN, PA-C     History of Present Illness Darlene Berry is a 73 y.o. female with past medical history of CAD s/p DES left circumflex  & CABG x 2, carotid artery disease s/p left CEA, PVD, insulin -dependent DM 2, recurrent UTIs, dyslipidemia.  02/12/2024 echo EF greater than 75%, moderate concentric LVH, grade 1 DD, aortic sclerosis present without stenosis 02/12/2024 carotid duplex mild bilateral carotid artery stenosis 02/12/2024 left heart cath grafts patent 12/13/2019 cardiac cath patency of graft site, left circumflex DES widely patent 06/11/2016 cardiac cath continued patency of the LIMA to LAD and SVG to distal RCA, left circumflex DES widely patent 09/2013 cardiac cath bypass graft patent.  Severely stenosed nongrafted left circumflex treated with DES. 03/10/2013 CABG x 2 LIMA to LAD, SVG to distal RCA 02/18/2013 cardiac cath multivessel CAD >> TCTS evaluation  She initially established care with Dr. Fell in 2014, this was following CABG x 2 completed at [facility].  In 2025 she began having episodes of chest pain, cardiac catheter was arranged revealing bypass grafts were patent, severely stenosed nongrafted left circumflex treated with DES x 1.  In 2017 she again began having episodes of chest pain, she underwent left heart cath revealing patent graft sites as well as patent previously placed stent.  In 2025 she again had episodes consistent with angina underwent repeat heart cath revealing no culprit lesion with recommendations for blood pressure control and continue medical management.  She presents today for follow-up of her CAD.  She has been doing well since she was last evaluated in the hospital, she feels like her episode of chest pain during that time was a  side effect of a medication. She stays busy around her house, says she gets up in the morning and doesn't sit down until 3 pm, also watched her great-grandchild. She denies chest pain, palpitations, dyspnea, pnd, orthopnea, n, v, dizziness, syncope, edema, weight gain, or early satiety.    ROS: Review of Systems  Musculoskeletal:  Positive for joint pain.  All other systems reviewed and are negative.    Studies Reviewed EKG Interpretation Date/Time:  Wednesday July 07 2024 13:13:46 EST Ventricular Rate:  71 PR Interval:  200 QRS Duration:  92 QT Interval:  408 QTC Calculation: 443 R Axis:   30  Text Interpretation: Normal sinus rhythm Normal ECG When compared with ECG of 13-Feb-2024 10:37, PR interval has decreased Criteria for Anterior infarct are no longer Present T wave inversion no longer evident in Inferior leads Nonspecific T wave abnormality no longer evident in Lateral leads Confirmed by Carlin Nest [phone]) on 07/07/2024 1:19:14 PM    Cardiac Studies & Procedures   ______________________________________________________________________________________________ CARDIAC CATHETERIZATION  CARDIAC CATHETERIZATION 02/12/2024  Conclusion Images from the original result were not included. Coronary and bypass graft angiography 02/12/2024: LM: No significant disease LAD: Prox LAD, diag 1 50-60% disease, followed by mid LAD occlusion, bypassed by patent LIMA-LAD Lcx: Prox-mid diffuse 40% disease, patent mid LAD stent with no restenosis RCA: Prox-mid diffuse 60% calcific disease, followed by mid RCA occlusion, bypassed by patent SVG-PDA LIMA-LAD: Patnent, no significant disease SVG-RPDA: Patent, no significant disease  LVEDP 4 mmHg    Severe native vessel disease Patent 2/2 grafts  No acute culprit lesion identified Continue medical management, especially aggressive  blood pressure control  Okay to discharge patient home later this evening after 2 hour bed rest is  completed and blood pressure is under control (SBP <150 mmHg).  Newman JINNY Lawrence, MD  Findings Coronary Findings Diagnostic  Dominance: Right  Left Anterior Descending Prox LAD lesion is 60% stenosed. Mid LAD lesion is 100% stenosed.  First Diagonal Branch 1st Diag lesion is 50% stenosed.  Lateral First Diagonal Branch Lat 1st Diag lesion is 70% stenosed. moderate stenosis of the first diagonal bifurcation  Left Circumflex Ost Cx to Mid Cx lesion is 40% stenosed. The lesion was previously treated . diffuse disease Previously placed Mid Cx stent of unknown type is  widely patent.  Right Coronary Artery Prox RCA to Mid RCA lesion is 60% stenosed. The lesion is severely calcified. Mid RCA to Dist RCA lesion is 100% stenosed.  LIMA LIMA Graft To Mid LAD LIMA.  LIMA-LAD widely patent  Saphenous Graft To Dist RCA SVG.  Widely patent SVG-distal RCA, supplies both the PDA and PLA branches with mild stenosis of the PLA ostium not significantly changed from prior study.  Intervention  No interventions have been documented.   CARDIAC CATHETERIZATION  CARDIAC CATHETERIZATION 06/05/2016  Conclusion 1. 3 vessel CAD 2. S/P CABG with continued patency of the LIMA-LAD and SVG-distal RCA 3. Continued patency of the stented segment in the left circumflex 4. Normal LV function  Recommendation: medical therapy for residual CAD, adjustment of antihypertensive Rx, risk reduction/tobacco cessation  Findings Coronary Findings Diagnostic  Dominance: Right  Left Anterior Descending  First Diagonal Branch  Lateral First Diagonal Branch moderate stenosis of the first diagonal bifurcation  Left Circumflex diffuse disease Previously placed Mid Cx drug eluting stent is widely patent.  Right Coronary Artery  LIMA LIMA Graft To Mid LAD LIMA. LIMA-LAD widely patent  Saphenous Graft To Dist RCA SVG. Widely patent SVG-distal RCA, supplies both the PDA and PLA branches with mild  stenosis of the PLA ostium not significantly changed from prior study.  Intervention  No interventions have been documented.   STRESS TESTS  MYOCARDIAL PERFUSION IMAGING 05/17/2015  Interpretation Summary  Nuclear stress EF: 67%. Normal LV function  There was no ST segment deviation noted during stress.  Defect 1: There is a small defect of mild severity present in the apex location. This appears to be most consistent with breast artifact. The apex contracts vigorously.  This is a low risk study. There is no ischemia and no evidence of infarction .   ECHOCARDIOGRAM  ECHOCARDIOGRAM COMPLETE 02/12/2024  Narrative ECHOCARDIOGRAM REPORT    Patient Name:   Darlene Berry Date of Exam: 02/12/2024 Medical Rec #:  [id number]       Height:       62.0 in Accession #:    [id number]      Weight:       141.4 lb Date of Birth:  16-Nov-1950       BSA:          1.650 m Patient Age:    72 years        BP:           184/56 mmHg Patient Gender: F               HR:           106 bpm. Exam Location:  Inpatient  Procedure: 2D Echo (Both Spectral and Color Flow Doppler were utilized during procedure).  Indications:    chest pain  History:  Patient has prior history of Echocardiogram examinations, most recent 02/19/2013. CAD, Prior CABG; Risk Factors:Diabetes, Hypertension and Dyslipidemia.  Sonographer:    Tinnie Barefoot RDCS Referring Phys: [id number] XIKA ZHAO  IMPRESSIONS   1. Mild intracavitary gradient of . [name] Left ventricular ejection fraction, by estimation, is >75%. The left ventricle has hyperdynamic function. The left ventricle has no regional wall motion abnormalities. There is moderate concentric left ventricular hypertrophy. Left ventricular diastolic parameters are consistent with Grade I diastolic dysfunction (impaired relaxation). 2. Right ventricular systolic function is normal. The right ventricular size is normal. 3. The mitral valve is normal in structure.  Trivial mitral valve regurgitation. No evidence of mitral stenosis. 4. The aortic valve is normal in structure. Aortic valve regurgitation is not visualized. Aortic valve sclerosis/calcification is present, without any evidence of aortic stenosis. 5. The inferior vena cava is normal in size with greater than 50% respiratory variability, suggesting right atrial pressure of 3 mmHg.  FINDINGS Left Ventricle: Mild intracavitary gradient of . Left ventricular ejection fraction, by estimation, is >75%. The left ventricle has hyperdynamic function. The left ventricle has no regional wall motion abnormalities. The left ventricular internal cavity size was small. There is moderate concentric left ventricular hypertrophy. Left ventricular diastolic parameters are consistent with Grade I diastolic dysfunction (impaired relaxation).  Right Ventricle: The right ventricular size is normal. No increase in right ventricular wall thickness. Right ventricular systolic function is normal.  Left Atrium: Left atrial size was normal in size.  Right Atrium: Right atrial size was normal in size.  Pericardium: There is no evidence of pericardial effusion.  Mitral Valve: The mitral valve is normal in structure. Mild mitral annular calcification. Trivial mitral valve regurgitation. No evidence of mitral valve stenosis.  Tricuspid Valve: The tricuspid valve is normal in structure. Tricuspid valve regurgitation is not demonstrated. No evidence of tricuspid stenosis.  Aortic Valve: The aortic valve is normal in structure. Aortic valve regurgitation is not visualized. Aortic valve sclerosis/calcification is present, without any evidence of aortic stenosis.  Pulmonic Valve: The pulmonic valve was normal in structure. Pulmonic valve regurgitation is not visualized. No evidence of pulmonic stenosis.  Aorta: The aortic root is normal in size and structure.  Venous: The inferior vena cava is normal in size with greater  than 50% respiratory variability, suggesting right atrial pressure of 3 mmHg.  IAS/Shunts: No atrial level shunt detected by color flow Doppler.   LEFT VENTRICLE PLAX 2D LVIDd:         2.90 cm   Diastology LVIDs:         2.10 cm   LV e' medial:  4.57 cm/s LV PW:         0.90 cm   LV e' lateral: 8.92 cm/s LV IVS:        1.20 cm LVOT diam:     1.90 cm LV SV:         43 LV SV Index:   26 LVOT Area:     2.84 cm   RIGHT VENTRICLE             IVC RV Basal diam:  2.10 cm     IVC diam: 1.10 cm RV S prime:     12.00 cm/s TAPSE (M-mode): 0.7 cm  LEFT ATRIUM             Index        RIGHT ATRIUM          Index LA diam:  3.00 cm 1.82 cm/m   RA Area:     9.37 cm LA Vol (A2C):   36.0 ml 21.82 ml/m  RA Volume:   17.80 ml 10.79 ml/m LA Vol (A4C):   36.5 ml 22.12 ml/m LA Biplane Vol: 36.9 ml 22.37 ml/m AORTIC VALVE LVOT Vmax:   108.00 cm/s LVOT Vmean:  71.200 cm/s LVOT VTI:    0.150 m  AORTA Ao Root diam: 2.60 cm Ao Asc diam:  2.90 cm   SHUNTS Systemic VTI:  0.15 m Systemic Diam: 1.90 cm  Morene Brownie Electronically signed by Morene Brownie Signature Date/Time: 02/12/2024/8:19:44 PM    Final          ______________________________________________________________________________________________      Risk Assessment/Calculations           Physical Exam VS:  BP 138/78   Pulse 71   Ht 5' 2 (1.575 m)   Wt 142 lb (64.4 kg)   SpO2 98%   BMI 25.97 kg/m        Wt Readings from Last 3 Encounters:  07/07/24 142 lb (64.4 kg)  02/13/24 134 lb 11.2 oz (61.1 kg)  02/02/24 141 lb 6.4 oz (64.1 kg)    GEN: Well nourished, well developed in no acute distress NECK: No JVD; No carotid bruits CARDIAC: RRR, soft murmur 2/6 LSB, rubs, gallops RESPIRATORY:  Clear to auscultation without rales, wheezing or rhonchi  ABDOMEN: Soft, non-tender, non-distended EXTREMITIES:  No edema; No deformity   ASSESSMENT AND PLAN CAD - Stable with no anginal symptoms. No  indication for ischemic evaluation.  Continue Plavix  75 mg daily, Norvasc  10 mg daily, Coreg  12.5 mg twice daily, Imdur  30 mg daily.  Dyslipidemia -most recent LDL was controlled at 43, this appears to be formally monitored by her PCP, continue fenofibrate  160 mg daily, Vascepa , Crestor  40 mg daily.  Hypertension -blood pressure initially elevated however recheck was better controlled at 138/78, she mentions she was evaluated her PCP yesterday and it was 122/64.  Continue Coreg  12.5 mg twice daily, Norvasc  10 mg daily, lisinopril  20 mg daily.  DM2-most recent A1c was elevated at 7.4%, currently on Ozempic, Glucophage .  Preoperative cardiovascular evaluation- According to the Revised Cardiac Risk Index (RCRI), her Perioperative Risk of Major Cardiac Event is (%): 0.9 Her Functional Capacity in METs is: 6.36 according to the Duke Activity Status Index (DASI). Therefore, based on ACC/AHA guidelines, patient would be at acceptable risk for the planned procedure without further cardiovascular testing. I will route this recommendation to the requesting party via Epic fax function.  She is optimized from a cardiac perspective, echocardiogram and heart cath in June of this year revealed patent graft sites.  Regarding her Plavix , it would be acceptable to hold up to 5 days prior to surgery however she mentions that her surgeon states she would only need to hold it for 2 days.  Recommend she restart as soon as possible as directed by the surgeon.         Dispo: Follow up in 1 year [facility] with Almarie Crate NP).   Signed, Delon JAYSON Hoover, NP  "

## 2024-06-30 ENCOUNTER — Telehealth (HOSPITAL_BASED_OUTPATIENT_CLINIC_OR_DEPARTMENT_OTHER): Payer: Self-pay | Admitting: *Deleted

## 2024-06-30 NOTE — Telephone Encounter (Signed)
   Name: Darlene Berry  DOB: [birth year]  MRN: [id number]  Primary Cardiologist: Ozell Fell, MD  Chart reviewed as part of pre-operative protocol coverage. The patient has an upcoming visit scheduled with  Delon Hoover, DNP on 07/07/2024 at which time clearance can be addressed in case there are any issues that would impact surgical recommendations.  LEFT TOTAL SHOULDER ARTHROPLASTY Is not scheduled until 09/03/2023 as below. I added preop FYI to appointment note so that provider is aware to address at time of outpatient visit.  Per office protocol the cardiology provider should forward their finalized clearance decision and recommendations regarding antiplatelet therapy to the requesting party below.    Per office protocol, if patient is without any new symptoms or concerns at the time of their virtual visit, she may hold Plavix  for 5 days prior to procedure. Please resume Plavix  as soon as possible postprocedure, at the discretion of the surgeon.    I will route this message as FYI to requesting party and remove this message from the preop box as separate preop APP input not needed at this time.   Please call with any questions.  Lamarr Satterfield, NP  06/30/2024, 4:41 PM

## 2024-06-30 NOTE — Telephone Encounter (Signed)
   Pre-operative Risk Assessment    Patient Name: Darlene Berry  DOB: [birth year] MRN: [id number]   Date of last office visit: 02/02/24 JOSEFA BEAUVAIS, FNP Date of next office visit: 07/07/24 DELON HOOVER, NP   Request for Surgical Clearance    Procedure:  LEFT TOTAL SHOULDER ARTHROPLASTY  Date of Surgery:  Clearance 09/02/24                                Surgeon:  DR. OZELL LAUFFENBURGER Surgeon's Group or Practice Name:  [facility]  Phone number:  [phone] Fax number:  [phone] GINA KOZEE   Type of Clearance Requested:   - Medical  - Pharmacy:  Hold Clopidogrel  (Plavix ) x 5 DAYS PRIOR   Type of Anesthesia:  Not Indicated; LEFT MESSAGE TO CALL BACK WITH TYPE OF ANESTHESIA   Additional requests/questions:    Bonney Niels Jest   06/30/2024, 4:01 PM

## 2024-06-30 NOTE — Telephone Encounter (Signed)
 Office called back to say it will be general anesthesia. Please advise

## 2024-07-07 ENCOUNTER — Encounter: Payer: Self-pay | Admitting: Cardiology

## 2024-07-07 ENCOUNTER — Ambulatory Visit: Attending: Cardiology | Admitting: Cardiology

## 2024-07-07 VITALS — BP 138/78 | HR 71 | Ht 62.0 in | Wt 142.0 lb

## 2024-07-07 DIAGNOSIS — E782 Mixed hyperlipidemia: Secondary | ICD-10-CM | POA: Diagnosis not present

## 2024-07-07 DIAGNOSIS — Z01818 Encounter for other preprocedural examination: Secondary | ICD-10-CM

## 2024-07-07 DIAGNOSIS — I10 Essential (primary) hypertension: Secondary | ICD-10-CM

## 2024-07-07 DIAGNOSIS — I25119 Atherosclerotic heart disease of native coronary artery with unspecified angina pectoris: Secondary | ICD-10-CM

## 2024-07-07 DIAGNOSIS — E1165 Type 2 diabetes mellitus with hyperglycemia: Secondary | ICD-10-CM

## 2024-07-07 NOTE — Patient Instructions (Signed)
 Medication Instructions:  Your physician recommends that you continue on your current medications as directed. Please refer to the Current Medication list given to you today.   *If you need a refill on your cardiac medications before your next appointment, please call your pharmacy*  Lab Work: None   If you have labs (blood work) drawn today and your tests are completely normal, you will receive your results only by: MyChart Message (if you have MyChart) OR A paper copy in the mail If you have any lab test that is abnormal or we need to change your treatment, we will call you to review the results.  Testing/Procedures: None   Follow-Up: At [facility], you and your health needs are our priority.  As part of our continuing mission to provide you with exceptional heart care, our providers are all part of one team.  This team includes your primary Cardiologist (physician) and Advanced Practice Providers or APPs (Physician Assistants and Nurse Practitioners) who all work together to provide you with the care you need, when you need it.  Your next appointment:   11 month(s)  Provider:   Almarie Crate, NP  We recommend signing up for the patient portal called MyChart.  Sign up information is provided on this After Visit Summary.  MyChart is used to connect with patients for Virtual Visits (Telemedicine).  Patients are able to view lab/test results, encounter notes, upcoming appointments, etc.  Non-urgent messages can be sent to your provider as well.   To learn more about what you can do with MyChart, go to [email or web address].   Other Instructions None
